# Patient Record
Sex: Female | Born: 1965 | Hispanic: No | Smoking: Never smoker
Health system: Southern US, Community
[De-identification: ages and names within clinical notes are randomized; demographics above are authoritative.]

## PROBLEM LIST (undated history)

## (undated) DIAGNOSIS — R51 Headache: Secondary | ICD-10-CM

## (undated) DIAGNOSIS — R0789 Other chest pain: Secondary | ICD-10-CM

## (undated) DIAGNOSIS — R002 Palpitations: Secondary | ICD-10-CM

## (undated) DIAGNOSIS — N8110 Cystocele, unspecified: Secondary | ICD-10-CM

## (undated) DIAGNOSIS — I10 Essential (primary) hypertension: Secondary | ICD-10-CM

## (undated) DIAGNOSIS — IMO0002 Reserved for concepts with insufficient information to code with codable children: Secondary | ICD-10-CM

## (undated) DIAGNOSIS — J069 Acute upper respiratory infection, unspecified: Secondary | ICD-10-CM

## (undated) DIAGNOSIS — F419 Anxiety disorder, unspecified: Secondary | ICD-10-CM

## (undated) DIAGNOSIS — S92919A Unspecified fracture of unspecified toe(s), initial encounter for closed fracture: Secondary | ICD-10-CM

## (undated) DIAGNOSIS — S139XXA Sprain of joints and ligaments of unspecified parts of neck, initial encounter: Secondary | ICD-10-CM

## (undated) DIAGNOSIS — D649 Anemia, unspecified: Secondary | ICD-10-CM

## (undated) DIAGNOSIS — R011 Cardiac murmur, unspecified: Secondary | ICD-10-CM

## (undated) DIAGNOSIS — I209 Angina pectoris, unspecified: Secondary | ICD-10-CM

## (undated) HISTORY — DX: Cardiac murmur, unspecified: R01.1

## (undated) HISTORY — DX: Other chest pain: R07.89

## (undated) HISTORY — DX: Anxiety disorder, unspecified: F41.9

## (undated) HISTORY — DX: Palpitations: R00.2

---
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
## 1998-05-11 ENCOUNTER — Emergency Department (HOSPITAL_COMMUNITY): Admission: EM | Admit: 1998-05-11 | Discharge: 1998-05-11 | Payer: Self-pay | Admitting: Emergency Medicine

## 1998-05-21 ENCOUNTER — Emergency Department (HOSPITAL_COMMUNITY): Admission: EM | Admit: 1998-05-21 | Discharge: 1998-05-21 | Payer: Self-pay | Admitting: Emergency Medicine

## 1998-12-19 ENCOUNTER — Encounter: Admission: RE | Admit: 1998-12-19 | Discharge: 1998-12-19 | Payer: Self-pay | Admitting: Internal Medicine

## 1999-02-04 ENCOUNTER — Emergency Department (HOSPITAL_COMMUNITY): Admission: EM | Admit: 1999-02-04 | Discharge: 1999-02-04 | Payer: Self-pay | Admitting: *Deleted

## 1999-02-04 ENCOUNTER — Encounter: Payer: Self-pay | Admitting: Emergency Medicine

## 1999-07-04 ENCOUNTER — Inpatient Hospital Stay (HOSPITAL_COMMUNITY): Admission: AD | Admit: 1999-07-04 | Discharge: 1999-07-04 | Payer: Self-pay | Admitting: Obstetrics & Gynecology

## 1999-07-07 ENCOUNTER — Encounter: Payer: Self-pay | Admitting: *Deleted

## 1999-07-07 ENCOUNTER — Emergency Department (HOSPITAL_COMMUNITY): Admission: EM | Admit: 1999-07-07 | Discharge: 1999-07-07 | Payer: Self-pay | Admitting: Emergency Medicine

## 1999-07-09 ENCOUNTER — Inpatient Hospital Stay (HOSPITAL_COMMUNITY): Admission: AD | Admit: 1999-07-09 | Discharge: 1999-07-09 | Payer: Self-pay | Admitting: *Deleted

## 1999-07-18 ENCOUNTER — Inpatient Hospital Stay (HOSPITAL_COMMUNITY): Admission: AD | Admit: 1999-07-18 | Discharge: 1999-07-18 | Payer: Self-pay | Admitting: Obstetrics & Gynecology

## 1999-12-05 ENCOUNTER — Inpatient Hospital Stay (HOSPITAL_COMMUNITY): Admission: AD | Admit: 1999-12-05 | Discharge: 1999-12-05 | Payer: Self-pay | Admitting: Obstetrics & Gynecology

## 1999-12-13 ENCOUNTER — Encounter: Admission: RE | Admit: 1999-12-13 | Discharge: 1999-12-13 | Payer: Self-pay | Admitting: Hematology and Oncology

## 2000-01-07 ENCOUNTER — Encounter: Admission: RE | Admit: 2000-01-07 | Discharge: 2000-01-07 | Payer: Self-pay | Admitting: Hematology and Oncology

## 2000-04-29 ENCOUNTER — Inpatient Hospital Stay (HOSPITAL_COMMUNITY): Admission: EM | Admit: 2000-04-29 | Discharge: 2000-04-29 | Payer: Self-pay | Admitting: Obstetrics

## 2000-05-11 ENCOUNTER — Inpatient Hospital Stay (HOSPITAL_COMMUNITY): Admission: AD | Admit: 2000-05-11 | Discharge: 2000-05-11 | Payer: Self-pay | Admitting: Obstetrics & Gynecology

## 2000-07-01 ENCOUNTER — Inpatient Hospital Stay (HOSPITAL_COMMUNITY): Admission: AD | Admit: 2000-07-01 | Discharge: 2000-07-01 | Payer: Self-pay | Admitting: Obstetrics

## 2000-10-28 ENCOUNTER — Encounter: Payer: Self-pay | Admitting: Emergency Medicine

## 2000-10-28 ENCOUNTER — Emergency Department (HOSPITAL_COMMUNITY): Admission: EM | Admit: 2000-10-28 | Discharge: 2000-10-28 | Payer: Self-pay | Admitting: Emergency Medicine

## 2000-11-09 ENCOUNTER — Encounter: Admission: RE | Admit: 2000-11-09 | Discharge: 2000-11-09 | Payer: Self-pay | Admitting: Internal Medicine

## 2000-11-10 ENCOUNTER — Inpatient Hospital Stay (HOSPITAL_COMMUNITY): Admission: AD | Admit: 2000-11-10 | Discharge: 2000-11-10 | Payer: Self-pay | Admitting: *Deleted

## 2000-11-26 ENCOUNTER — Encounter: Admission: RE | Admit: 2000-11-26 | Discharge: 2000-11-26 | Payer: Self-pay | Admitting: Internal Medicine

## 2001-02-17 ENCOUNTER — Inpatient Hospital Stay (HOSPITAL_COMMUNITY): Admission: AD | Admit: 2001-02-17 | Discharge: 2001-02-17 | Payer: Self-pay | Admitting: Obstetrics

## 2001-06-25 ENCOUNTER — Inpatient Hospital Stay (HOSPITAL_COMMUNITY): Admission: AD | Admit: 2001-06-25 | Discharge: 2001-06-25 | Payer: Self-pay | Admitting: Obstetrics & Gynecology

## 2002-02-01 ENCOUNTER — Inpatient Hospital Stay (HOSPITAL_COMMUNITY): Admission: AD | Admit: 2002-02-01 | Discharge: 2002-02-01 | Payer: Self-pay | Admitting: *Deleted

## 2002-03-10 ENCOUNTER — Encounter: Payer: Self-pay | Admitting: Internal Medicine

## 2002-03-10 ENCOUNTER — Emergency Department (HOSPITAL_COMMUNITY): Admission: EM | Admit: 2002-03-10 | Discharge: 2002-03-10 | Payer: Self-pay | Admitting: Emergency Medicine

## 2002-09-11 ENCOUNTER — Encounter: Payer: Self-pay | Admitting: Emergency Medicine

## 2002-09-11 ENCOUNTER — Emergency Department (HOSPITAL_COMMUNITY): Admission: EM | Admit: 2002-09-11 | Discharge: 2002-09-11 | Payer: Self-pay | Admitting: Emergency Medicine

## 2003-06-14 ENCOUNTER — Emergency Department (HOSPITAL_COMMUNITY): Admission: EM | Admit: 2003-06-14 | Discharge: 2003-06-14 | Payer: Self-pay | Admitting: Emergency Medicine

## 2004-06-19 ENCOUNTER — Emergency Department (HOSPITAL_COMMUNITY): Admission: EM | Admit: 2004-06-19 | Discharge: 2004-06-19 | Payer: Self-pay | Admitting: Emergency Medicine

## 2006-12-27 ENCOUNTER — Emergency Department (HOSPITAL_COMMUNITY): Admission: EM | Admit: 2006-12-27 | Discharge: 2006-12-27 | Payer: Self-pay | Admitting: *Deleted

## 2007-02-08 ENCOUNTER — Inpatient Hospital Stay (HOSPITAL_COMMUNITY): Admission: AD | Admit: 2007-02-08 | Discharge: 2007-02-08 | Payer: Self-pay | Admitting: Obstetrics & Gynecology

## 2007-02-11 ENCOUNTER — Emergency Department (HOSPITAL_COMMUNITY): Admission: EM | Admit: 2007-02-11 | Discharge: 2007-02-11 | Payer: Self-pay | Admitting: Emergency Medicine

## 2007-07-03 ENCOUNTER — Emergency Department (HOSPITAL_COMMUNITY): Admission: EM | Admit: 2007-07-03 | Discharge: 2007-07-03 | Payer: Self-pay | Admitting: Emergency Medicine

## 2007-11-01 ENCOUNTER — Emergency Department (HOSPITAL_COMMUNITY): Admission: EM | Admit: 2007-11-01 | Discharge: 2007-11-01 | Payer: Self-pay | Admitting: Emergency Medicine

## 2008-01-23 ENCOUNTER — Inpatient Hospital Stay (HOSPITAL_COMMUNITY): Admission: AD | Admit: 2008-01-23 | Discharge: 2008-01-23 | Payer: Self-pay | Admitting: Obstetrics & Gynecology

## 2008-02-03 ENCOUNTER — Emergency Department (HOSPITAL_COMMUNITY): Admission: EM | Admit: 2008-02-03 | Discharge: 2008-02-03 | Payer: Self-pay | Admitting: Emergency Medicine

## 2008-03-31 ENCOUNTER — Emergency Department (HOSPITAL_COMMUNITY): Admission: EM | Admit: 2008-03-31 | Discharge: 2008-03-31 | Payer: Self-pay | Admitting: Emergency Medicine

## 2008-05-17 ENCOUNTER — Inpatient Hospital Stay (HOSPITAL_COMMUNITY): Admission: AD | Admit: 2008-05-17 | Discharge: 2008-05-17 | Payer: Self-pay | Admitting: Obstetrics & Gynecology

## 2008-06-14 ENCOUNTER — Inpatient Hospital Stay (HOSPITAL_COMMUNITY): Admission: AD | Admit: 2008-06-14 | Discharge: 2008-06-14 | Payer: Self-pay | Admitting: Family Medicine

## 2008-09-22 DIAGNOSIS — J069 Acute upper respiratory infection, unspecified: Secondary | ICD-10-CM

## 2008-09-22 HISTORY — DX: Acute upper respiratory infection, unspecified: J06.9

## 2008-10-12 ENCOUNTER — Emergency Department (HOSPITAL_COMMUNITY): Admission: EM | Admit: 2008-10-12 | Discharge: 2008-10-12 | Payer: Self-pay | Admitting: Emergency Medicine

## 2009-02-25 ENCOUNTER — Inpatient Hospital Stay (HOSPITAL_COMMUNITY): Admission: AD | Admit: 2009-02-25 | Discharge: 2009-02-25 | Payer: Self-pay | Admitting: Obstetrics & Gynecology

## 2009-06-07 ENCOUNTER — Emergency Department (HOSPITAL_COMMUNITY): Admission: EM | Admit: 2009-06-07 | Discharge: 2009-06-07 | Payer: Self-pay | Admitting: Emergency Medicine

## 2009-11-01 ENCOUNTER — Inpatient Hospital Stay (HOSPITAL_COMMUNITY): Admission: AD | Admit: 2009-11-01 | Discharge: 2009-11-01 | Payer: Self-pay | Admitting: Obstetrics & Gynecology

## 2009-11-04 ENCOUNTER — Emergency Department (HOSPITAL_COMMUNITY): Admission: EM | Admit: 2009-11-04 | Discharge: 2009-11-04 | Payer: Self-pay | Admitting: Emergency Medicine

## 2009-12-20 ENCOUNTER — Ambulatory Visit: Payer: Self-pay | Admitting: Obstetrics and Gynecology

## 2009-12-28 ENCOUNTER — Ambulatory Visit: Payer: Self-pay | Admitting: Family Medicine

## 2009-12-28 DIAGNOSIS — R002 Palpitations: Secondary | ICD-10-CM

## 2009-12-28 DIAGNOSIS — L570 Actinic keratosis: Secondary | ICD-10-CM

## 2009-12-28 DIAGNOSIS — N949 Unspecified condition associated with female genital organs and menstrual cycle: Secondary | ICD-10-CM

## 2009-12-28 DIAGNOSIS — E669 Obesity, unspecified: Secondary | ICD-10-CM

## 2009-12-28 DIAGNOSIS — I10 Essential (primary) hypertension: Secondary | ICD-10-CM

## 2009-12-28 HISTORY — DX: Palpitations: R00.2

## 2010-01-04 ENCOUNTER — Encounter: Payer: Self-pay | Admitting: Family Medicine

## 2010-01-04 ENCOUNTER — Ambulatory Visit: Payer: Self-pay | Admitting: Family Medicine

## 2010-01-04 LAB — CONVERTED CEMR LAB
Cholesterol: 192 mg/dL (ref 0–200)
Triglycerides: 57 mg/dL (ref <150)
VLDL: 11 mg/dL (ref 0–40)

## 2010-01-07 ENCOUNTER — Encounter: Payer: Self-pay | Admitting: Family Medicine

## 2010-01-07 LAB — CONVERTED CEMR LAB
AST: 12 units/L (ref 0–37)
BUN: 18 mg/dL (ref 6–23)
Calcium: 9.6 mg/dL (ref 8.4–10.5)
Chloride: 103 meq/L (ref 96–112)
Creatinine, Ser: 0.95 mg/dL (ref 0.40–1.20)

## 2010-07-22 ENCOUNTER — Ambulatory Visit: Payer: Self-pay | Admitting: Nurse Practitioner

## 2010-07-22 ENCOUNTER — Inpatient Hospital Stay (HOSPITAL_COMMUNITY): Admission: AD | Admit: 2010-07-22 | Discharge: 2010-07-22 | Payer: Self-pay | Admitting: Obstetrics and Gynecology

## 2010-10-22 NOTE — Assessment & Plan Note (Signed)
Summary: NP/HTN/KF   Vital Signs:  Patient profile:   45 year old female Height:      66.5 inches Weight:      189.7 pounds BMI:     30.27 Temp:     97.8 degrees F oral Pulse rate:   79 / minute BP sitting:   128 / 89  (left arm) Cuff size:   regular  Vitals Entered By: Gladstone Pih (December 28, 2009 4:03 PM) CC: NP-HTN Is Patient Diabetic? No Pain Assessment Patient in pain? no        Primary Care Provider:  Antoine Primas  CC:  NP-HTN.  History of Present Illness: 45 yo female here to establich care.  Hx is + for HTN, pt states that it has been as high as 200's systolic but today is 128/89.  Pt denies any HA, visual changes, n/v, abdominal pain or cp.  Pt has been on clonidine for the last year and recently was placed on HCTZ by her gynacolgist.  Pt always had her medications prescribed by urgent care.  Hx of palpatations used to them happen from time to time but seems to always resolve within a minute.  Never accompanied by chest pain or LOC.    Obesity-  Pt has started a diet recently and lost 3 # .  Pt is joining the [facility] and already has an exercise plan in mind  Fm hx significant for CAD deaths.  Never has had her cholesterol checked.    Hx of DUB.  Pt is being followed by gyn clinic.    Habits & Providers  Alcohol-Tobacco-Diet     Tobacco Status: never  Current Medications (verified): 1)  Hydrochlorothiazide 25 Mg Tabs (Hydrochlorothiazide) .Marland Kitchen.. 1 Tab By Mouth Once Daily 2)  Catapres 0.1 Mg Tabs (Clonidine Hcl) .... Take 1 Tab By Mouth Two Times A Day  Allergies (verified): No Known Drug Allergies  Past History:  Past Surgical History: reattachment of left ring finger as a kid (36 yo)  Family History: Fm hx of heart dx, DM, PVD on both sides of family.    Social History: lives with roommate no smoke, drink, or illicits works as a Secondary school teacher Has some college education widowSmoking Status:  never  Review of Systems       deneis fever, chills, nausea,  vomiting, diarrhea or constipation   Physical Exam  General:  mild obese, NAD pleasant Eyes:  PERRLA, EOMI Ears:  TM intact Mouth:  MMM, mildly poor detition Lungs:  CTAB Heart:  RRR no murmur Abdomen:  BS+, NT, ND Extremities:  +2 pulses Neurologic:  2-12 intact Skin:  face- right cheek has a AK   Impression & Recommendations:  Problem # 1:  ESSENTIAL HYPERTENSION, BENIGN (ICD-401.1) controlled today.  Will get labs in near future and f/u in 1-2 months.  Continue pt current tx.   Her updated medication list for this problem includes:    Hydrochlorothiazide 25 Mg Tabs (Hydrochlorothiazide) .Marland Kitchen... 1 tab by mouth once daily    Catapres 0.1 Mg Tabs (Clonidine hcl) .Marland Kitchen... Take 1 tab by mouth two times a day  Orders: Comp Met[facility] ([id number])Future Orders: Lipid[facility] ([id number]) ... 01/10/2011 Comp [facility] ([id number]) ... 01/08/2011  Problem # 2:  PALPITATIONS (ICD-785.1) having none now, will address if become a problem  Problem # 3:  ACTINIC KERATOSIS (ICD-702.0) likely freeze at next visit  Complete Medication List: 1)  Hydrochlorothiazide 25 Mg Tabs (Hydrochlorothiazide) .Marland Kitchen.. 1 tab by mouth once daily 2)  Catapres  0.1 Mg Tabs (Clonidine hcl) .... Take 1 tab by mouth two times a day  Patient Instructions: 1)  It is great to meet you 2)  I am givng you your blood pressure medication.  With diet and exercise I hope we can get off medication in the year. 3)  I am drawing some blood today, and also I need you to come in while fasting for a cholesterol level.  When I get the results, no news is good news.   4)  I want to see you again in 2-3 months  Prescriptions: CATAPRES 0.1 MG TABS (CLONIDINE HCL) take 1 tab by mouth two times a day  #186 x 3   Entered and Authorized by:   Antoine Primas DO   Signed by:   Antoine Primas DO on 12/28/2009   Method used:   Electronically to        [facility] (retail)       [address]       [place], [place]   [id number]       Ph: [phone]       Fax: [phone]   RxID:   [id number] HYDROCHLOROTHIAZIDE 25 MG TABS (HYDROCHLOROTHIAZIDE) 1 tab by mouth once daily  #96 x 3   Entered and Authorized by:   Antoine Primas DO   Signed by:   Antoine Primas DO on 12/28/2009   Method used:   Electronically to        [facility] [address] (retail)       [address]       [place], [place]  [id number]       Ph: [phone]       Fax: [phone]   RxID:   [id number]

## 2010-10-26 ENCOUNTER — Encounter: Payer: Self-pay | Admitting: *Deleted

## 2010-12-04 LAB — URINE MICROSCOPIC-ADD ON

## 2010-12-04 LAB — CBC
HCT: 34.2 % — ABNORMAL LOW (ref 36.0–46.0)
MCH: 29.4 pg (ref 26.0–34.0)
MCHC: 33.4 g/dL (ref 30.0–36.0)
MCV: 87.9 fL (ref 78.0–100.0)
RDW: 14.4 % (ref 11.5–15.5)

## 2010-12-04 LAB — URINALYSIS, ROUTINE W REFLEX MICROSCOPIC
Bilirubin Urine: NEGATIVE
Nitrite: NEGATIVE
Specific Gravity, Urine: >1.03 — ABNORMAL HIGH (ref 1.005–1.030)
pH: 6 (ref 5.0–8.0)

## 2010-12-04 LAB — WET PREP, GENITAL: Clue Cells Wet Prep HPF POC: NONE SEEN

## 2010-12-11 LAB — POCT I-STAT, CHEM 8
BUN: 13 mg/dL (ref 6–23)
Chloride: 104 mEq/L (ref 96–112)
Creatinine, Ser: 1 mg/dL (ref 0.4–1.2)
Sodium: 137 mEq/L (ref 135–145)
TCO2: 29 mmol/L (ref 0–100)

## 2010-12-11 LAB — URINE MICROSCOPIC-ADD ON

## 2010-12-11 LAB — URINALYSIS, ROUTINE W REFLEX MICROSCOPIC
Nitrite: NEGATIVE
Protein, ur: 30 mg/dL — AB
Specific Gravity, Urine: 1.014 (ref 1.005–1.030)
Urobilinogen, UA: 0.2 mg/dL (ref 0.0–1.0)

## 2010-12-11 LAB — POCT PREGNANCY, URINE: Preg Test, Ur: NEGATIVE

## 2010-12-13 LAB — CBC
HCT: 35 % — ABNORMAL LOW (ref 36.0–46.0)
MCHC: 32.6 g/dL (ref 30.0–36.0)
MCV: 82.5 fL (ref 78.0–100.0)
RBC: 4.24 MIL/uL (ref 3.87–5.11)
WBC: 6.5 10*3/uL (ref 4.0–10.5)

## 2010-12-13 LAB — COMPREHENSIVE METABOLIC PANEL
AST: 19 U/L (ref 0–37)
BUN: 10 mg/dL (ref 6–23)
CO2: 28 mEq/L (ref 19–32)
Calcium: 9.7 mg/dL (ref 8.4–10.5)
Chloride: 104 mEq/L (ref 96–112)
Creatinine, Ser: 1.08 mg/dL (ref 0.4–1.2)
GFR calc Af Amer: >60 mL/min (ref >60)
GFR calc non Af Amer: 55 mL/min — ABNORMAL LOW (ref >60)
Glucose, Bld: 98 mg/dL (ref 70–99)
Total Bilirubin: 0.2 mg/dL — ABNORMAL LOW (ref 0.3–1.2)

## 2010-12-13 LAB — URINE MICROSCOPIC-ADD ON

## 2010-12-13 LAB — URINALYSIS, ROUTINE W REFLEX MICROSCOPIC
Bilirubin Urine: NEGATIVE
Leukocytes, UA: NEGATIVE
Nitrite: NEGATIVE
Specific Gravity, Urine: 1.025 (ref 1.005–1.030)
Urobilinogen, UA: 0.2 mg/dL (ref 0.0–1.0)
pH: 6 (ref 5.0–8.0)

## 2010-12-13 LAB — GC/CHLAMYDIA PROBE AMP, GENITAL
Chlamydia, DNA Probe: NEGATIVE
GC Probe Amp, Genital: NEGATIVE

## 2010-12-13 LAB — WET PREP, GENITAL: Trich, Wet Prep: NONE SEEN

## 2010-12-29 ENCOUNTER — Emergency Department (HOSPITAL_COMMUNITY): Payer: Self-pay

## 2010-12-29 ENCOUNTER — Observation Stay (HOSPITAL_COMMUNITY)
Admission: EM | Admit: 2010-12-29 | Discharge: 2010-12-31 | Disposition: A | Discharge status: Home / Self Care | Payer: Self-pay | Attending: Family Medicine | Admitting: Family Medicine

## 2010-12-29 DIAGNOSIS — I10 Essential (primary) hypertension: Secondary | ICD-10-CM

## 2010-12-29 DIAGNOSIS — R112 Nausea with vomiting, unspecified: Secondary | ICD-10-CM | POA: Insufficient documentation

## 2010-12-29 DIAGNOSIS — N838 Other noninflammatory disorders of ovary, fallopian tube and broad ligament: Secondary | ICD-10-CM | POA: Insufficient documentation

## 2010-12-29 DIAGNOSIS — R109 Unspecified abdominal pain: Secondary | ICD-10-CM

## 2010-12-29 DIAGNOSIS — M545 Low back pain, unspecified: Secondary | ICD-10-CM | POA: Insufficient documentation

## 2010-12-29 LAB — COMPREHENSIVE METABOLIC PANEL
ALT: 16 U/L (ref 0–35)
Alkaline Phosphatase: 71 U/L (ref 39–117)
BUN: 7 mg/dL (ref 6–23)
CO2: 18 mEq/L — ABNORMAL LOW (ref 19–32)
Chloride: 107 mEq/L (ref 96–112)
Glucose, Bld: 116 mg/dL — ABNORMAL HIGH (ref 70–99)
Potassium: 3.4 mEq/L — ABNORMAL LOW (ref 3.5–5.1)
Sodium: 135 mEq/L (ref 135–145)
Total Bilirubin: 0.5 mg/dL (ref 0.3–1.2)
Total Protein: 7.1 g/dL (ref 6.0–8.3)

## 2010-12-29 LAB — CBC
HCT: 36.7 % (ref 36.0–46.0)
Hemoglobin: 12.1 g/dL (ref 12.0–15.0)
MCV: 84.2 fL (ref 78.0–100.0)
RBC: 4.36 MIL/uL (ref 3.87–5.11)
WBC: 8.8 10*3/uL (ref 4.0–10.5)

## 2010-12-29 LAB — URINALYSIS, ROUTINE W REFLEX MICROSCOPIC
Glucose, UA: NEGATIVE mg/dL
Ketones, ur: NEGATIVE mg/dL
Nitrite: NEGATIVE
Specific Gravity, Urine: 1.019 (ref 1.005–1.030)
pH: 8 (ref 5.0–8.0)

## 2010-12-29 LAB — DIFFERENTIAL
Basophils Absolute: 0 10*3/uL (ref 0.0–0.1)
Eosinophils Relative: 1 % (ref 0–5)
Lymphocytes Relative: 13 % (ref 12–46)
Lymphs Abs: 1.1 10*3/uL (ref 0.7–4.0)
Neutro Abs: 7.2 10*3/uL (ref 1.7–7.7)
Neutrophils Relative %: 82 % — ABNORMAL HIGH (ref 43–77)

## 2010-12-30 LAB — WET PREP, GENITAL: Yeast Wet Prep HPF POC: NONE SEEN

## 2010-12-30 LAB — URINALYSIS, ROUTINE W REFLEX MICROSCOPIC
Glucose, UA: NEGATIVE mg/dL
Ketones, ur: NEGATIVE mg/dL
Protein, ur: NEGATIVE mg/dL
Urobilinogen, UA: 0.2 mg/dL (ref 0.0–1.0)

## 2010-12-30 LAB — POCT PREGNANCY, URINE: Preg Test, Ur: NEGATIVE

## 2010-12-30 LAB — GC/CHLAMYDIA PROBE AMP, GENITAL: GC Probe Amp, Genital: NEGATIVE

## 2010-12-31 LAB — BASIC METABOLIC PANEL
Calcium: 9.4 mg/dL (ref 8.4–10.5)
GFR calc Af Amer: >60 mL/min (ref >60)
GFR calc non Af Amer: >60 mL/min (ref >60)
Potassium: 3.6 mEq/L (ref 3.5–5.1)
Sodium: 138 mEq/L (ref 135–145)

## 2010-12-31 LAB — CA 125: CA 125: 4.3 U/mL (ref 0.0–30.2)

## 2010-12-31 LAB — CBC
Hemoglobin: 11 g/dL — ABNORMAL LOW (ref 12.0–15.0)
MCV: 84.7 fL (ref 78.0–100.0)
Platelets: 275 10*3/uL (ref 150–400)
RBC: 3.99 MIL/uL (ref 3.87–5.11)
WBC: 7.6 10*3/uL (ref 4.0–10.5)

## 2010-12-31 LAB — GC/CHLAMYDIA PROBE AMP, URINE: GC Probe Amp, Urine: NEGATIVE

## 2011-01-03 ENCOUNTER — Ambulatory Visit: Payer: Self-pay | Admitting: Obstetrics & Gynecology

## 2011-01-04 NOTE — Discharge Summary (Signed)
NAMEJASLYNNE, [name]         ACCOUNT NO.:  [id number]  MEDICAL RECORD NO.:  [id number]           PATIENT TYPE:  E  LOCATION:  [facility]                         FACILITY:  [facility]  PHYSICIAN:  Ayda Tancredi A. Sheffield Mckinney, M.D.    DATE OF BIRTH:  September 24, 1965  DATE OF ADMISSION:  12/29/2010 DATE OF DISCHARGE:  12/31/2010                              DISCHARGE SUMMARY   PRIMARY CARE PROVIDER:  Antoine Primas, DO, at [facility]  DISCHARGE DIAGNOSES: 1. Right flank pain and low back pain, likely secondary to ovarian     mass. 2. Nausea, vomiting secondary to pain. 3. Hypertension.  DISCHARGE MEDICATIONS: 1. Tylenol 325 mg two tablets by mouth every 8 hours as needed for     pain. 2. Colace 100 mg one tablet by mouth twice daily as needed for     constipation. 3. Zofran 4 mg tablets, one tablet by mouth every 6 hours as needed     for nausea and vomiting. 4. Percocet 5/325 one tablet by mouth every 6 hours as needed for     severe pain. 5. Phenergan rectal suppository 12.5 mg one tablet rectally every 8     hours as needed for nausea and vomiting and unable to tolerate oral     medications, please place tablet per rectum. 6. Phenergan 12.5 one tablet by mouth every 8 hours as needed for     nausea or vomiting, hold for drowsiness. 7. Clonidine 0.1 mg one tablet by mouth twice daily. 8. Hydrochlorothiazide 25 mg one tablet by mouth daily. 9. Ibuprofen 200 mg 4 tablets by mouth every 6 hours as needed for     headaches.  CONSULTS:  None at this time.  PROCEDURES:  On December 29, 2010, CT abdomen and pelvis without contrast: 1. No acute abnormality. 2. Bilateral ovarian cystic lesions, likely cyst.  Right cystic lesion     measures 46 mm.  Six weeks followup transvaginal ultrasound     recommended to assess for resolution.  PERTINENT LABORATORY DATA:  At discharge, BMET on December 31, 2010, sodium 138, potassium 3.6, chloride 106, CO2 is 25, BUN 10, creatinine 0.77, glucose 105.  On  December 30, 2010, a GC/Chlamydia on urine were negative. CBC:  White count 7.6, hemoglobin 11, hematocrit 33.8, platelets 275. Urine pregnancy negative.  BRIEF HOSPITAL COURSE:  This is a 45 year old female who has a history of ovarian cyst since 2010.  She presented with right flank pain, low back pain, nausea, vomiting, and was unable to tolerate p.o. 1. Right flank and low back pain.  The patient initially was started     on morphine IV p.r.n. in the emergency department.  When the     patient was transferred to the floor, we discontinued the morphine     and started patient on Toradol 30 mg IV every 6 hours as needed     for pain.  The patient was receiving the Toradol every 6 hours and     did claim that in between doses, she still endorsed pain in her     lower back.  On hospital day 2, the Toradol IV was discontinued  and     the patient was started on Tylenol 650 every 8 hours as needed.  We     will discharge the patient home with the Tylenol 650 mg every hours     as needed and give a prescription for Percocet 5/325 one tablet     every 6 hours as needed for severe pain.  The patient will receive     enough Percocet for 8 days until she follows up with her primary     care provider, Dr. Antoine Primas, at [facility] in     1 week. 2. Nausea, vomiting.  This resolved on hospital day #2.  The patient     was tolerating a p.o. diet.  We will discharge the patient home     with both Phenergan by mouth and per rectum and Zofran as needed for     nausea and vomiting. 3. Hypertension.  The patient's blood pressures remained stable     throughout the hospital course.  The patient was continued on her     home regimen of clonidine 0.1 mg b.i.d. and hydrochlorothiazide 25     mg daily. 4. History of ovarian cyst.  On a previous admission on February 25, 2009,     the patient had a transvaginal ultrasound:  A 2-cm posterior     intramural fibroid identified, 6 mm echogenic focus  within the     endometrial tissue that may be an area of mineralization or a tiny     endometrial polyp, 4.3-cm simple appearing cyst in the right     adnexal space, and 2.3-cm cyst identified within the left ovary.     On December 29, 2010, CT abdomen and pelvis, results were as above.  We     will refer the patient to [facility] to help manage the     patient's ovarian masses.  It seems that the patient presents to     the emergency department frequently for nausea, vomiting, low back     pain, and right flank pain.  DISCHARGE INSTRUCTIONS:  Activity:  Ad lib. Diet:  Low sodium, heart healthy.  FOLLOWUP APPOINTMENTS:   1. Return or please see Dr. Debroah Loop on Friday, January 03, 2011, at 8:45 a.m. 2. Please return to Dr. Katrinka Blazing on Monday, January 07, 2011, at 3:15 p.m.  SPECIAL INSTRUCTIONS:  Please see a gynecologist at the [facility] for management of your ovarian masses.  Please take the Percocet one tablet every 6 hours as needed for severe pain and please hold for any drowsiness and please take the Zofran and Phenergan by mouth as needed for nausea, vomiting.  If you cannot tolerate swallowing pills, please use the Phenergan per rectum.  DISCHARGE CONDITION:  The patient was discharged home in stable medical condition.    ______________________________ Barnabas Lister, MD   ______________________________ [name], M.D.    ID/MEDQ  D:  12/31/2010  T:  01/01/2011  Job:  [id number]  cc:   Scheryl Darter, MD  Electronically Signed by Barnabas Lister MD on 01/02/2011 02:35:57 PM Electronically Signed by Zachery Dauer M.D. on 01/04/2011 07:19:22 PM

## 2011-01-07 ENCOUNTER — Encounter: Payer: Self-pay | Admitting: Family Medicine

## 2011-01-07 ENCOUNTER — Ambulatory Visit (INDEPENDENT_AMBULATORY_CARE_PROVIDER_SITE_OTHER): Payer: Self-pay | Admitting: Family Medicine

## 2011-01-07 DIAGNOSIS — N83209 Unspecified ovarian cyst, unspecified side: Secondary | ICD-10-CM

## 2011-01-07 DIAGNOSIS — M999 Biomechanical lesion, unspecified: Secondary | ICD-10-CM

## 2011-01-07 HISTORY — DX: Unspecified ovarian cyst, unspecified side: N83.209

## 2011-01-07 NOTE — Progress Notes (Signed)
  Subjective:    Patient ID: [name], female    DOB: 1966-08-25, 45 y.o.   MRN: [id number]  HPI Pt recently admitted with flank pain, found to have ovarian cyst bilateral scheduled on May 2nd to get a repeat ultrasound to make sure no change in size. Pt states the pain is much better. Pain does come and go but is much more improved. Pt states the percocet upset her stomach so not taking it, pt has been taking motrin which helps.  Pt states it still has pain from time to time usually with certain movements, that does not seem to be the same pain as before.  Pt bowel and bladder are normal, no radiating pain, no pain with foods. Pt states still mostly flank pain.    Review of Systems Denies fever, chills, nausea vomiting abdominal pain, dysuria, chest pain, shortness of breath dyspnea on exertion or numbness in extremities     Objective:   Physical Exam Gen: NAD,  HEENT: EOMI, PERRLA CV: RRR no murmur Pul: CTAB Abd: BS +, NT, ND, no CVA tenderness Ext no edema.  OMT Findings: Cervical:none Thoracic none Lumbar: L3 rotated and side bent right Sacrum: left on left     Assessment & Plan:

## 2011-01-07 NOTE — Assessment & Plan Note (Signed)
After verbal consent improved with HVLA on side.

## 2011-01-07 NOTE — Assessment & Plan Note (Signed)
Pt has follow up with gyn and ultrasound, will give motrin, for prn pain, will see if normal simple cysts then consider OCP

## 2011-01-13 ENCOUNTER — Other Ambulatory Visit: Payer: Self-pay | Admitting: Family Medicine

## 2011-01-13 NOTE — H&P (Signed)
[name], [name] NO.:  [id number]  MEDICAL RECORD NO.:  [id number]           PATIENT TYPE:  E  LOCATION:  [facility]                         FACILITY:  [facility]  PHYSICIAN:  [name], M.D.DATE OF BIRTH:  23-Aug-1966  DATE OF ADMISSION:  12/29/2010 DATE OF DISCHARGE:                             HISTORY & PHYSICAL   PCP:  Cassandra Dare, MD at [facility].  CHIEF COMPLAINT:  Right flank pain, nausea, and vomiting.  HISTORY OF PRESENT ILLNESS:  [name] is a 45 year old female presenting with right flank pain with an acute onset started yesterday and described as constant, sharp, and stabbing.  She does have some mild nausea and vomiting associated with the pain when it is more severe.  A thorough workup in the ER revealed bilateral ovarian cyst, but no other suspicious etiology for her pain.  The pain is moderately controlled with IV morphine at this time, but the patient is unable to keep anything down and we were asked to admit for overnight observation and fluids.  The patient denies headache, dizziness, vision changes, chest pain, shortness of breath, suprapubic pain, dysuria, abnormal vaginal bleeding or discharge, lower extremity edema, rash, history of abdominal surgeries, melena, and bright red blood per rectum.  She does endorse having her menses on December 24, 2010.  PAST MEDICAL HISTORY:  Hypertension, obesity, history of kidney stones, and history of vaginal bleeding.  ALLERGIES:  SEPTRA AND LATEX.  MEDICATIONS: 1. Clonidine 0.1 mg p.o. b.i.d. 2. Hydrochlorothiazide 25 mg p.o. daily.  PAST SURGICAL HISTORY:  Left ring finger reattachment at age 75.  SOCIAL HISTORY:  The patient lives in [place].  She denies tobacco, alcohol, or drug use.  FAMILY HISTORY:  Diabetes mellitus type 2.  REVIEW OF SYSTEMS:  See HPI.  PHYSICAL EXAM:  VITAL SIGNS:  Temperature 98, pulse 68, respiratory rate 20, blood pressure 162/109,  pulse oximetry 98% on room air. GENERAL:  She is alert and oriented x3, in mild distress on her right side. HEENT:  Normocephalic, atraumatic.  Pupils are equal, round, and reactive to light.  Extraocular muscles are intact.  Mucous membranes are moist. NECK:  Supple with no JVD, no thyromegaly. HEART:  Regular rate and rhythm.  No murmurs, rubs, or gallops. LUNGS:  Clear to auscultation bilaterally. ABDOMEN:  Soft with positive bowel sounds, nondistended with no masses, no guarding.  She does have mild tenderness to palpation on her right upper and lower quadrant. BACK:  No tenderness to palpation along paraspinal muscles or spinous process and she has no CVA tenderness. EXTREMITIES:  No cyanosis, clubbing, or edema. NEURO:  Cranial nerves II through XII are intact.  Strength is symmetric.  LABORATORY DATA:  White count 8.8, hemoglobin 12.1, hematocrit 36.7, platelets 308, neutrophils 82%.  Urine is negative.  Sodium 135, potassium 3.4 and this was replaced in the ER, chloride 107, CO2 of 18, BUN 7, creatinine 0.65, glucose 116.  LFTs are within normal limits.  CT of abdomen and pelvis showed bilateral ovarian cyst.  Urine pregnancy was negative.  ASSESSMENT/PLAN:  The patient is a 45 year old female with, 1. Right flank pain, no red flags,  possible etiology is due to ovarian     cyst.  We will admit overnight for observation, pain control,     nausea control, and IV fluids and likely send the patient to home     tomorrow if her pain is controlled and she is able to keep down     foods. 2. Hypertension.  We will restart her home medications. 3. Fluids, electrolytes, nutrition/gastrointestinal.  Again,     maintenance IV fluids and push p.o. fluids. 4. Prophylaxis heparin t.i.d. for DVT prophylaxis and disposition.  DISPOSITION:  Likely home in the morning.     Helane Rima, MD   ______________________________ [name], M.D.    EW/MEDQ  D:  12/29/2010  T:   12/29/2010  Job:  [id number]  Electronically Signed by Helane Rima MD on 01/06/2011 10:04:03 AM Electronically Signed by Acquanetta Belling M.D. on 01/13/2011 07:38:03 AM

## 2011-01-14 NOTE — Telephone Encounter (Signed)
Refill request

## 2011-01-16 ENCOUNTER — Telehealth: Payer: Self-pay | Admitting: Family Medicine

## 2011-01-16 MED ORDER — IBUPROFEN 800 MG PO TABS: 800.0000 mg | ORAL_TABLET | Freq: Three times a day (TID) | ORAL | Status: AC | PRN

## 2011-01-16 NOTE — Telephone Encounter (Signed)
Pt calling to ask for refill on Clonidine and rx for  Ibuprofen 800mg .  Was suppose to receive these when she came in for her appt on 4/17.  Show that refill was entered 4/23 for Clonidine, but pharmacy never received.  Please let her know when rxs have been sent.

## 2011-01-16 NOTE — Telephone Encounter (Signed)
Called pharmacy and clonidine is there . Advised patient that MD states in note, Motrin PRN pain. She wants RX for Ibuprofen 800 mg. Advised will send message to MD. Advised that he is out of office this week but should be back first of next  week. [facility] [address].

## 2011-01-16 NOTE — Telephone Encounter (Signed)
Refilled and done

## 2011-01-22 ENCOUNTER — Ambulatory Visit (INDEPENDENT_AMBULATORY_CARE_PROVIDER_SITE_OTHER): Payer: Self-pay | Admitting: Family Medicine

## 2011-01-22 ENCOUNTER — Other Ambulatory Visit: Payer: Self-pay | Admitting: Family Medicine

## 2011-01-22 ENCOUNTER — Ambulatory Visit: Payer: Self-pay | Admitting: Family Medicine

## 2011-01-22 DIAGNOSIS — N83209 Unspecified ovarian cyst, unspecified side: Secondary | ICD-10-CM

## 2011-01-22 LAB — POCT PREGNANCY, URINE: Preg Test, Ur: NEGATIVE

## 2011-01-23 NOTE — Group Therapy Note (Signed)
[name], [name]         ACCOUNT NO.:  [id number]  MEDICAL RECORD NO.:  [id number]           PATIENT TYPE:  LOCATION:  [facility]                     FACILITY:  PHYSICIAN:  Lucina Mellow, DO   DATE OF BIRTH:  [birth year]  DATE OF SERVICE:  01/22/2011                                 CLINIC NOTE  HISTORY OF PRESENT ILLNESS:  The patient presents to the clinic today for followup of her ovarian cyst.  She is a 45 year old female who is gravida 3, para 3-0-0-3 who presents after being hospitalized overnight for workup of right lower quadrant pain which is found to be an ovarian cyst.  She was referred by the family practice clinic at [facility] to be evaluated to see if oral contraceptive pills will be a good option for her to control her ovulation and causing her cyst that are now symptomatic.  The patient states that she is currently not sexually active.  She has done childbearing.  Currently not practicing any birth control methods and is open to birth control tablets at this time.  She has a history of elevated blood pressure and takes clonidine and hydrochlorothiazide and also takes Motrin 800 mg every hours as needed for the pain associated with her ovarian cyst.  She reports that she feels that she is about to start her period but she has not typically started with second day of the month.  PAST MEDICAL HISTORY:  High blood pressure and some history of kidney stones.  SURGICAL HISTORY:  Reattached of her left ring finger when she was 53 years' old.  She does not smoke, denies use of drugs or alcohol.  PHYSICAL EXAMINATION:  VITAL SIGNS:  On exam the patient's vital signs are temperature of 98, pulse is 81, blood pressure 134/91, weight of 207.3 pounds, height of 68 inches. GENERAL:  She is a pleasant appearing Caucasian female who looks her stated age. HEART:  Regular rate and rhythm with no audible murmurs. LUNGS:  Clear to auscultation. PELVIC:  External  genitalia normal.  External genitalia with appropriate amount of hair distribution.  With speculum exam it was noted that her vagina is moist.  This was appropriate amount of rugae and it is pink and moist.  Her cervix is easily visualized.  There are no abnormalities seen.  No discharge or bleeding noted in the vaginal vault or from the cervix.  Bimanual examination reveals tenderness to palpation with fullness on the patient's right.  Uterus is normal in size and shape and the left ovary is barely palpable is nontender.  ASSESSMENT: 1. Ovarian cyst ultrasound for December 29, 2010, shows that there are     bilateral ovarian cysts with the right cystic lesion measuring 4.6     cm and recommendation of a 6-week followup.  The patient is in     agreement of trying Depo Provera today after she did get a negative     urine pregnancy test in the clinic because we feel that to give her     oral contraceptive pills with her history of high blood pressure on     two occasions with only exacerbate that problem.  The patient  agrees to this and received Depo shot today.  When she leaves the     clinic today, she will make an ultrasound appointment for followup     of her ovarian cyst and she will be seen in clinic after the     ultrasound to discuss those results as well as the fact of the Depo-     Provera on her period and on her ovarian cyst symptomatology. 2. High blood pressure.  She is to continue with her clonidine     hydrochlorothiazide has preserve her primary care     provider and to follow up with them routinely as necessary for her     blood pressure.  The patient voices understanding of all teaching     and plans.          ______________________________ Lucina Mellow, DO    SH/MEDQ  D:  01/22/2011  T:  01/23/2011  Job:  [id number]

## 2011-02-11 ENCOUNTER — Ambulatory Visit (HOSPITAL_COMMUNITY): Payer: Self-pay

## 2011-02-11 ENCOUNTER — Ambulatory Visit (HOSPITAL_COMMUNITY): Admission: RE | Admit: 2011-02-11 | Payer: Self-pay | Source: Ambulatory Visit

## 2011-03-03 ENCOUNTER — Telehealth: Payer: Self-pay | Admitting: Family Medicine

## 2011-03-03 ENCOUNTER — Other Ambulatory Visit: Payer: Self-pay | Admitting: Family Medicine

## 2011-03-03 NOTE — Telephone Encounter (Signed)
Has taken the last pill of HCTZ and there are no refills needs to be faxed to [facility] in [place] [phone].

## 2011-03-03 NOTE — Telephone Encounter (Signed)
Refill request

## 2011-03-05 ENCOUNTER — Telehealth: Payer: Self-pay | Admitting: Family Medicine

## 2011-03-05 NOTE — Telephone Encounter (Signed)
Received 2 indications for this medication.  [facility] location.  If needs to be refilled elsewhere, can do this as well.  This is Dr. Michaelle Copas patient.

## 2011-03-05 NOTE — Telephone Encounter (Signed)
[facility] in [place], needs prescriptions faxed to the [facility]. Needs [facility] to [facility] sent [facility].  Please call when this is completed.

## 2011-03-07 NOTE — Telephone Encounter (Signed)
[facility] and they do have RX. Was sent in on 03/05/2011 Patient notified

## 2011-03-07 NOTE — Telephone Encounter (Signed)
Ms. Wike called to say she doesn't use [facility].  Need her rx for hctz sent to [facility] on [address]  Have been waiting since Monday.

## 2011-03-23 DIAGNOSIS — S139XXA Sprain of joints and ligaments of unspecified parts of neck, initial encounter: Secondary | ICD-10-CM

## 2011-03-23 DIAGNOSIS — IMO0002 Reserved for concepts with insufficient information to code with codable children: Secondary | ICD-10-CM

## 2011-03-23 HISTORY — DX: Reserved for concepts with insufficient information to code with codable children: IMO0002

## 2011-03-23 HISTORY — DX: Sprain of joints and ligaments of unspecified parts of neck, initial encounter: S13.9XXA

## 2011-04-12 ENCOUNTER — Encounter (HOSPITAL_COMMUNITY): Payer: Self-pay | Admitting: *Deleted

## 2011-04-12 ENCOUNTER — Inpatient Hospital Stay (HOSPITAL_COMMUNITY)
Admission: AD | Admit: 2011-04-12 | Discharge: 2011-04-12 | Discharge status: Against Medical Advice | Payer: Self-pay | Source: Ambulatory Visit | Attending: Obstetrics & Gynecology | Admitting: Obstetrics & Gynecology

## 2011-04-12 DIAGNOSIS — S3994XA Unspecified injury of external genitals, initial encounter: Secondary | ICD-10-CM

## 2011-04-12 DIAGNOSIS — S39848A Other specified injuries of external genitals, initial encounter: Secondary | ICD-10-CM | POA: Insufficient documentation

## 2011-04-12 DIAGNOSIS — IMO0002 Reserved for concepts with insufficient information to code with codable children: Secondary | ICD-10-CM

## 2011-04-12 DIAGNOSIS — R300 Dysuria: Secondary | ICD-10-CM

## 2011-04-12 DIAGNOSIS — N898 Other specified noninflammatory disorders of vagina: Secondary | ICD-10-CM

## 2011-04-12 DIAGNOSIS — S3993XA Unspecified injury of pelvis, initial encounter: Secondary | ICD-10-CM

## 2011-04-12 DIAGNOSIS — N949 Unspecified condition associated with female genital organs and menstrual cycle: Secondary | ICD-10-CM

## 2011-04-12 HISTORY — DX: Essential (primary) hypertension: I10

## 2011-04-12 HISTORY — DX: Headache: R51

## 2011-04-12 HISTORY — DX: Palpitations: R00.2

## 2011-04-12 LAB — URINE MICROSCOPIC-ADD ON

## 2011-04-12 LAB — WET PREP, GENITAL

## 2011-04-12 LAB — POCT PREGNANCY, URINE: Preg Test, Ur: NEGATIVE

## 2011-04-12 LAB — URINALYSIS, ROUTINE W REFLEX MICROSCOPIC
Ketones, ur: 15 mg/dL — AB
Nitrite: NEGATIVE
Protein, ur: NEGATIVE mg/dL
Urobilinogen, UA: 0.2 mg/dL (ref 0.0–1.0)

## 2011-04-12 MED ORDER — AZITHROMYCIN 250 MG PO TABS
1000.0000 mg | ORAL_TABLET | Freq: Once | ORAL | Status: AC
  Administered 2011-04-12: 1000 mg via ORAL
  Filled 2011-04-12: qty 4

## 2011-04-12 MED ORDER — METRONIDAZOLE 500 MG PO TABS
2000.0000 mg | ORAL_TABLET | Freq: Once | ORAL | Status: AC
  Administered 2011-04-12: 2000 mg via ORAL
  Filled 2011-04-12: qty 4

## 2011-04-12 MED ORDER — CEFIXIME 400 MG PO TABS
400.0000 mg | ORAL_TABLET | Freq: Once | ORAL | Status: AC
  Administered 2011-04-12: 400 mg via ORAL
  Filled 2011-04-12: qty 1

## 2011-04-12 MED ORDER — ONDANSETRON HCL 4 MG PO TABS
4.0000 mg | ORAL_TABLET | Freq: Once | ORAL | Status: AC
  Administered 2011-04-12: 4 mg via ORAL
  Filled 2011-04-12 (×2): qty 1

## 2011-04-12 MED ORDER — CYCLOBENZAPRINE HCL 10 MG PO TABS: 10.0000 mg | ORAL_TABLET | Freq: Three times a day (TID) | ORAL | Status: AC | PRN

## 2011-04-12 MED ORDER — LIDOCAINE HCL 2 % EX GEL
Freq: Once | CUTANEOUS | Status: AC
  Administered 2011-04-12: 20 via URETHRAL
  Filled 2011-04-12: qty 20

## 2011-04-12 MED ORDER — PROMETHAZINE HCL 25 MG PO TABS: 25.0000 mg | ORAL_TABLET | Freq: Four times a day (QID) | ORAL | Status: AC | PRN

## 2011-04-12 NOTE — ED Provider Notes (Signed)
HTN followed by PCP. Pt states she took BP meds today. Encouraged to call ASAP to discuss persistent high BPs. Reviewed BPs with Dr. Marice Potter, attending. No additional meds reccommended at this time.   Filed Vitals:   04/12/11 2047 04/12/11 2325  BP: 137/104 146/107  Pulse: 78 82  Temp: 98.6 F (37 C) 98.5 F (36.9 C)  TempSrc: Oral Oral  Resp: 20 20  Height: 5' 6.75" (1.695 m)   Weight: 92.08 kg (203 lb)
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
## 2011-04-16 ENCOUNTER — Ambulatory Visit (INDEPENDENT_AMBULATORY_CARE_PROVIDER_SITE_OTHER): Payer: Self-pay | Admitting: Family

## 2011-04-16 ENCOUNTER — Encounter: Payer: Self-pay | Admitting: Family

## 2011-04-16 DIAGNOSIS — Z113 Encounter for screening for infections with a predominantly sexual mode of transmission: Secondary | ICD-10-CM

## 2011-04-16 DIAGNOSIS — M542 Cervicalgia: Secondary | ICD-10-CM

## 2011-04-16 DIAGNOSIS — Z3049 Encounter for surveillance of other contraceptives: Secondary | ICD-10-CM

## 2011-04-16 LAB — HERPES SIMPLEX VIRUS CULTURE

## 2011-04-16 MED ORDER — MEDROXYPROGESTERONE ACETATE 150 MG/ML IM SUSP
150.0000 mg | Freq: Once | INTRAMUSCULAR | Status: AC
  Administered 2011-04-16: 150 mg via INTRAMUSCULAR

## 2011-04-16 MED ORDER — ACETAMINOPHEN-CODEINE 300-30 MG PO TABS: 1.0000 | ORAL_TABLET | ORAL | Status: DC | PRN

## 2011-04-16 MED ORDER — MEDROXYPROGESTERONE ACETATE 150 MG/ML IM SUSP: 150.0000 mg | INTRAMUSCULAR | Status: DC
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
## 2011-04-16 NOTE — Progress Notes (Signed)
Addended by: Lynnell Dike on: 04/16/2011 02:19 PM   Modules accepted: Orders

## 2011-04-17 LAB — GONOCOCCUS DNA, PCR: GC Probe Amp, Genital: NEGATIVE

## 2011-06-06 ENCOUNTER — Telehealth: Payer: Self-pay | Admitting: *Deleted
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
## 2011-06-13 LAB — I-STAT 8, (EC8 V) (CONVERTED LAB)
BUN: 11
Bicarbonate: 22.8
HCT: 36
Hemoglobin: 12.2
Operator id: 151321
Sodium: 136
TCO2: 24

## 2011-06-13 LAB — CBC
MCV: 83.4
Platelets: 301
WBC: 8.7

## 2011-06-13 LAB — POCT CARDIAC MARKERS
CKMB, poc: <1 — ABNORMAL LOW
Myoglobin, poc: 46.6
Operator id: 151321
Troponin i, poc: <0.05
Troponin i, poc: <0.05

## 2011-06-13 LAB — DIFFERENTIAL
Basophils Relative: 1
Eosinophils Absolute: 0.1
Lymphs Abs: 1.8
Neutrophils Relative %: 72

## 2011-06-23 LAB — URINALYSIS, ROUTINE W REFLEX MICROSCOPIC
Bilirubin Urine: NEGATIVE
Ketones, ur: NEGATIVE
Nitrite: NEGATIVE
Urobilinogen, UA: 0.2

## 2011-06-23 LAB — WET PREP, GENITAL

## 2011-07-02 ENCOUNTER — Ambulatory Visit: Payer: Self-pay

## 2012-01-01 ENCOUNTER — Other Ambulatory Visit: Payer: Self-pay | Admitting: Family Medicine

## 2012-01-01 MED ORDER — HYDROCHLOROTHIAZIDE 25 MG PO TABS: 25.0000 mg | ORAL_TABLET | Freq: Every day | ORAL | Status: DC

## 2012-01-01 NOTE — Telephone Encounter (Signed)
done

## 2012-01-01 NOTE — Telephone Encounter (Signed)
Patient is calling about a request from [facility] for HCTZ that was sent on 4/5 but was not replied on.  She is out of this medication.

## 2012-01-02 ENCOUNTER — Other Ambulatory Visit: Payer: Self-pay | Admitting: Advanced Practice Midwife

## 2012-01-02 ENCOUNTER — Ambulatory Visit (INDEPENDENT_AMBULATORY_CARE_PROVIDER_SITE_OTHER): Payer: Self-pay | Admitting: Advanced Practice Midwife

## 2012-01-02 VITALS — BP 161/103 | HR 82 | Temp 97.5°F | Ht 68.0 in | Wt 219.3 lb

## 2012-01-02 DIAGNOSIS — N760 Acute vaginitis: Secondary | ICD-10-CM

## 2012-01-02 DIAGNOSIS — N926 Irregular menstruation, unspecified: Secondary | ICD-10-CM

## 2012-01-02 DIAGNOSIS — Z3049 Encounter for surveillance of other contraceptives: Secondary | ICD-10-CM

## 2012-01-02 DIAGNOSIS — T7421XA Adult sexual abuse, confirmed, initial encounter: Secondary | ICD-10-CM

## 2012-01-02 DIAGNOSIS — R1031 Right lower quadrant pain: Secondary | ICD-10-CM

## 2012-01-02 DIAGNOSIS — A499 Bacterial infection, unspecified: Secondary | ICD-10-CM

## 2012-01-02 DIAGNOSIS — B9689 Other specified bacterial agents as the cause of diseases classified elsewhere: Secondary | ICD-10-CM

## 2012-01-02 DIAGNOSIS — IMO0002 Reserved for concepts with insufficient information to code with codable children: Secondary | ICD-10-CM

## 2012-01-02 DIAGNOSIS — N949 Unspecified condition associated with female genital organs and menstrual cycle: Secondary | ICD-10-CM

## 2012-01-02 LAB — WET PREP, GENITAL
Trich, Wet Prep: NONE SEEN
Yeast Wet Prep HPF POC: NONE SEEN

## 2012-01-02 MED ORDER — MEDROXYPROGESTERONE ACETATE 150 MG/ML IM SUSP
150.0000 mg | INTRAMUSCULAR | Status: AC
  Administered 2012-01-02: 150 mg via INTRAMUSCULAR

## 2012-01-02 MED ORDER — METRONIDAZOLE 250 MG PO TABS: 2000.0000 mg | ORAL_TABLET | Freq: Once | ORAL | Status: AC
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
## 2012-01-02 NOTE — Patient Instructions (Signed)

## 2012-01-04 ENCOUNTER — Encounter: Payer: Self-pay | Admitting: Advanced Practice Midwife

## 2012-01-04 DIAGNOSIS — IMO0002 Reserved for concepts with insufficient information to code with codable children: Secondary | ICD-10-CM | POA: Insufficient documentation

## 2012-01-04 DIAGNOSIS — B9689 Other specified bacterial agents as the cause of diseases classified elsewhere: Secondary | ICD-10-CM | POA: Insufficient documentation

## 2012-01-07 ENCOUNTER — Ambulatory Visit (HOSPITAL_COMMUNITY): Admission: RE | Admit: 2012-01-07 | Payer: Self-pay | Source: Ambulatory Visit

## 2012-01-26 ENCOUNTER — Other Ambulatory Visit: Payer: Self-pay | Admitting: Family Medicine

## 2012-02-04 ENCOUNTER — Emergency Department (HOSPITAL_COMMUNITY): Payer: Self-pay

## 2012-02-04 ENCOUNTER — Encounter (HOSPITAL_COMMUNITY): Payer: Self-pay | Admitting: *Deleted

## 2012-02-04 ENCOUNTER — Observation Stay (HOSPITAL_COMMUNITY)
Admission: EM | Admit: 2012-02-04 | Discharge: 2012-02-05 | Disposition: A | Discharge status: Home / Self Care | Payer: Self-pay | Source: Ambulatory Visit | Attending: Family Medicine | Admitting: Family Medicine

## 2012-02-04 DIAGNOSIS — Z8249 Family history of ischemic heart disease and other diseases of the circulatory system: Secondary | ICD-10-CM | POA: Insufficient documentation

## 2012-02-04 DIAGNOSIS — I10 Essential (primary) hypertension: Secondary | ICD-10-CM

## 2012-02-04 DIAGNOSIS — E669 Obesity, unspecified: Secondary | ICD-10-CM | POA: Diagnosis present

## 2012-02-04 DIAGNOSIS — R0602 Shortness of breath: Secondary | ICD-10-CM | POA: Insufficient documentation

## 2012-02-04 DIAGNOSIS — F411 Generalized anxiety disorder: Secondary | ICD-10-CM

## 2012-02-04 DIAGNOSIS — R0789 Other chest pain: Secondary | ICD-10-CM | POA: Diagnosis present

## 2012-02-04 DIAGNOSIS — R002 Palpitations: Secondary | ICD-10-CM | POA: Diagnosis present

## 2012-02-04 DIAGNOSIS — I319 Disease of pericardium, unspecified: Principal | ICD-10-CM | POA: Insufficient documentation

## 2012-02-04 DIAGNOSIS — E876 Hypokalemia: Secondary | ICD-10-CM | POA: Insufficient documentation

## 2012-02-04 HISTORY — DX: Anemia, unspecified: D64.9

## 2012-02-04 HISTORY — DX: Sprain of joints and ligaments of unspecified parts of neck, initial encounter: S13.9XXA

## 2012-02-04 HISTORY — DX: Acute upper respiratory infection, unspecified: J06.9

## 2012-02-04 HISTORY — DX: Reserved for concepts with insufficient information to code with codable children: IMO0002

## 2012-02-04 HISTORY — DX: Anxiety disorder, unspecified: F41.9

## 2012-02-04 HISTORY — DX: Angina pectoris, unspecified: I20.9

## 2012-02-04 LAB — POCT I-STAT, CHEM 8
BUN: 13 mg/dL (ref 6–23)
Chloride: 106 mEq/L (ref 96–112)
Glucose, Bld: 100 mg/dL — ABNORMAL HIGH (ref 70–99)
HCT: 37 % (ref 36.0–46.0)
Potassium: 2.7 mEq/L — CL (ref 3.5–5.1)

## 2012-02-04 LAB — CBC
HCT: 36.1 % (ref 36.0–46.0)
Hemoglobin: 12.2 g/dL (ref 12.0–15.0)
MCV: 83.6 fL (ref 78.0–100.0)
RDW: 13.2 % (ref 11.5–15.5)
WBC: 8.7 10*3/uL (ref 4.0–10.5)

## 2012-02-04 LAB — DIFFERENTIAL
Basophils Absolute: 0 10*3/uL (ref 0.0–0.1)
Eosinophils Relative: 1 % (ref 0–5)
Lymphocytes Relative: 20 % (ref 12–46)
Monocytes Absolute: 0.6 10*3/uL (ref 0.1–1.0)
Monocytes Relative: 7 % (ref 3–12)

## 2012-02-04 LAB — POCT I-STAT TROPONIN I

## 2012-02-04 MED ORDER — NITROGLYCERIN 0.4 MG SL SUBL
0.4000 mg | SUBLINGUAL_TABLET | SUBLINGUAL | Status: DC | PRN
  Administered 2012-02-04: 0.4 mg via SUBLINGUAL

## 2012-02-04 MED ORDER — MORPHINE SULFATE 4 MG/ML IJ SOLN
4.0000 mg | Freq: Once | INTRAMUSCULAR | Status: AC
  Administered 2012-02-05: 4 mg via INTRAVENOUS
  Filled 2012-02-04: qty 1

## 2012-02-04 MED ORDER — POTASSIUM CHLORIDE CRYS ER 20 MEQ PO TBCR
40.0000 meq | EXTENDED_RELEASE_TABLET | Freq: Once | ORAL | Status: AC
  Administered 2012-02-04: 40 meq via ORAL
  Filled 2012-02-04: qty 2

## 2012-02-04 MED ORDER — ONDANSETRON HCL 4 MG/2ML IJ SOLN
4.0000 mg | Freq: Once | INTRAMUSCULAR | Status: AC
  Administered 2012-02-04: 4 mg via INTRAVENOUS
  Filled 2012-02-04: qty 2

## 2012-02-04 MED ORDER — POTASSIUM CHLORIDE 10 MEQ/100ML IV SOLN
10.0000 meq | Freq: Once | INTRAVENOUS | Status: AC
  Administered 2012-02-05: 10 meq via INTRAVENOUS
  Filled 2012-02-04: qty 100

## 2012-02-04 MED ORDER — FENTANYL CITRATE 0.05 MG/ML IJ SOLN
50.0000 ug | Freq: Once | INTRAMUSCULAR | Status: AC
  Administered 2012-02-04: 50 ug via INTRAVENOUS

## 2012-02-04 MED ORDER — FENTANYL CITRATE 0.05 MG/ML IJ SOLN
50.0000 ug | Freq: Once | INTRAMUSCULAR | Status: AC
  Administered 2012-02-04: 50 ug via INTRAVENOUS
  Filled 2012-02-04: qty 2

## 2012-02-04 NOTE — ED Notes (Signed)
Pharm tech into room.

## 2012-02-04 NOTE — ED Provider Notes (Signed)
History     CSN: [id number]  Arrival date & time 02/04/12  2115   First MD Initiated Contact with Patient 02/04/12 2308      Chief Complaint  Patient presents with  . Chest Pain  . Shortness of Breath    (Consider location/radiation/quality/duration/timing/severity/associated sxs/prior treatment) HPI  46 year old female with history of hypertension, history of heart palpitation presents with chief complaints of chest pain. Patient states for the past several months she has been having intermittent bouts of chest pressure. She described the sensation as a fist pressing against the left chest.  Pain worsen with movement. She also complaining of associated headache, left arm tingling sensation, and associate shortness of breath. Symptoms worse whenever she is stressed out and usually improves when she lays stills. Today while sitting at home she experiencing the same chest pressure sensation that appears to be a persistence and lasting for several hours. She also complaining of associated shortness of breath and heart palpitation. She proceeds to call EMS to bring her to the ED. Patient states nitroglycerin was given prior to arrival has helped. Patient denies fever, chills, cough, nausea, vomiting, abdominal pain, urinary symptoms, or rash. Patient denies prior cardiac workup. Patient does admits to having a strong family history of cardiac problems. She is a nonsmoker. Pt currently endorse chest pain.    Past Medical History  Diagnosis Date  . Headache     Improved w blood pressure meds  . Heart palpitations     Since 20's - Cardiologist visit  . Hypertension     2008  . Migraines     Past Surgical History  Procedure Date  . Reattachment of finger [id number]    Family History  Problem Relation Age of Onset  . Hypertension Mother   . Diabetes Sister   . Hypertension Sister   . Heart disease Brother   . Hypertension Brother   . Diabetes Maternal Grandmother   . Heart disease  Maternal Grandmother   . Hypertension Maternal Grandmother   . Diabetes Maternal Grandfather   . Heart disease Maternal Grandfather   . Hypertension Maternal Grandfather   . Diabetes Paternal Grandmother   . Heart disease Paternal Grandmother   . Hypertension Paternal Grandmother   . Diabetes Paternal Grandfather   . Heart disease Paternal Grandfather   . Hypertension Paternal Grandfather     History  Substance Use Topics  . Smoking status: Never Smoker   . Smokeless tobacco: Never Used  . Alcohol Use: No    OB History    Grav Para Term Preterm Abortions TAB SAB Ect Mult Living   3 3 3       3       Review of Systems  All other systems reviewed and are negative.    Allergies  Latex; Other; and Septra  Home Medications   Current Outpatient Rx  Name Route Sig Dispense Refill  . CALCIUM-VITAMIN D 600-200 MG-UNIT PO CAPS Oral Take 1 tablet by mouth daily.      Marland Kitchen CLONIDINE HCL 0.1 MG PO TABS Oral Take 0.1 mg by mouth 2 (two) times daily.    Marland Kitchen HYDROCHLOROTHIAZIDE 25 MG PO TABS Oral Take 1 tablet (25 mg total) by mouth daily. 90 tablet 1  . IBUPROFEN 800 MG PO TABS Oral Take 800 mg by mouth daily. For pain     . MULTI-VITAMIN/MINERALS PO TABS Oral Take 1 tablet by mouth daily.        BP 122/72  Pulse 78  Temp(Src) 98.1 F (36.7 C) (Oral)  Resp 16  SpO2 99%  Physical Exam  Nursing note and vitals reviewed. Constitutional: She is oriented to person, place, and time. She appears well-developed and well-nourished. No distress.       Awake, alert, nontoxic appearance  HENT:  Head: Atraumatic.  Eyes: Conjunctivae are normal. Right eye exhibits no discharge. Left eye exhibits no discharge.  Neck: Neck supple.  Cardiovascular: Normal rate and regular rhythm.   Pulmonary/Chest: Effort normal. No respiratory distress. She exhibits no tenderness.  Abdominal: Soft. There is no tenderness. There is no rebound.  Musculoskeletal: She exhibits no edema and no tenderness.        ROM appears intact, no obvious focal weakness  Neurological: She is oriented to person, place, and time.       Mental status and motor strength appears intact  Skin: Skin is warm. No rash noted.  Psychiatric: She has a normal mood and affect.    ED Course  Procedures (including critical care time)  Labs Reviewed  POCT I-STAT, CHEM 8 - Abnormal; Notable for the following:    Potassium 2.7 (*)    Glucose, Bld 100 (*)    All other components within normal limits  CBC  DIFFERENTIAL  POCT I-STAT TROPONIN I   Dg Chest Portable 1 View  02/04/2012  *RADIOLOGY REPORT*  Clinical Data: Left chest, arm, and neck pain.  Shortness of breath.  PORTABLE CHEST - 1 VIEW  Comparison: 11/04/2009  Findings: Reverse lordotic angulation noted.  Cardiac and mediastinal contours appear normal.  The lungs appear clear.  No pleural effusion is identified.  IMPRESSION:  No significant abnormality identified.  Original Report Authenticated By: Dellia Cloud, M.D.     No diagnosis found.  Results for orders placed during the hospital encounter of 02/04/12  CBC      Component Value Range   WBC 8.7  4.0 - 10.5 (K/uL)   RBC 4.32  3.87 - 5.11 (MIL/uL)   Hemoglobin 12.2  12.0 - 15.0 (g/dL)   HCT 62.9  52.8 - 41.3 (%)   MCV 83.6  78.0 - 100.0 (fL)   MCH 28.2  26.0 - 34.0 (pg)   MCHC 33.8  30.0 - 36.0 (g/dL)   RDW 24.4  01.0 - 27.2 (%)   Platelets 276  150 - 400 (K/uL)  DIFFERENTIAL      Component Value Range   Neutrophils Relative 71  43 - 77 (%)   Neutro Abs 6.2  1.7 - 7.7 (K/uL)   Lymphocytes Relative 20  12 - 46 (%)   Lymphs Abs 1.8  0.7 - 4.0 (K/uL)   Monocytes Relative 7  3 - 12 (%)   Monocytes Absolute 0.6  0.1 - 1.0 (K/uL)   Eosinophils Relative 1  0 - 5 (%)   Eosinophils Absolute 0.1  0.0 - 0.7 (K/uL)   Basophils Relative 1  0 - 1 (%)   Basophils Absolute 0.0  0.0 - 0.1 (K/uL)  POCT I-STAT, CHEM 8      Component Value Range   Sodium 141  135 - 145 (mEq/L)   Potassium 2.7 (*) 3.5 - 5.1  (mEq/L)   Chloride 106  96 - 112 (mEq/L)   BUN 13  6 - 23 (mg/dL)   Creatinine, Ser 5.36  0.50 - 1.10 (mg/dL)   Glucose, Bld 644 (*) 70 - 99 (mg/dL)   Calcium, Ion 0.34  7.42 - 1.32 (mmol/L)   TCO2 23  0 -  100 (mmol/L)   Hemoglobin 12.6  12.0 - 15.0 (g/dL)   HCT 46.9  62.9 - 52.8 (%)   Comment NOTIFIED PHYSICIAN    POCT I-STAT TROPONIN I      Component Value Range   Troponin i, poc 0.01  0.00 - 0.08 (ng/mL)   Comment 3            Dg Chest Portable 1 View  02/04/2012  *RADIOLOGY REPORT*  Clinical Data: Left chest, arm, and neck pain.  Shortness of breath.  PORTABLE CHEST - 1 VIEW  Comparison: 11/04/2009  Findings: Reverse lordotic angulation noted.  Cardiac and mediastinal contours appear normal.  The lungs appear clear.  No pleural effusion is identified.  IMPRESSION:  No significant abnormality identified.  Original Report Authenticated By: Dellia Cloud, M.D.     Date: 02/04/2012  Rate: 79  Rhythm: normal sinus rhythm  QRS Axis: normal  Intervals: normal  ST/T Wave abnormalities: normal  Conduction Disutrbances:none  Narrative Interpretation:   Old EKG Reviewed: unchanged    MDM  Persistent chest pain, but atypical.  No prior cardiac work up. ECG normal, neg troponin, K+2.7 but no evidence of changes on EKG, supplementation given.  Due to chronicity of sxs and lack of risk factors for PE, my suspicion of PE is low.  CXR unremarkable.  Will consult with [facility] for admission for cardiac rule out tomorrow.  Discussed care with my attending.    12:21 AM I have consulted with [facility] who agrees to see pt in ED and will admit pt to tele bed, under the care of attending Dr. Mauricio Po. Will give ativan and order 2nd set of cardiac enzyme.  Pt currently in NAD.  VSS.        Fayrene Helper, PA-C 02/05/12 [phone]

## 2012-02-04 NOTE — ED Notes (Signed)
EDP notified of 2.7 K+

## 2012-02-04 NOTE — ED Notes (Signed)
Pt arrives by EMS from home, here for CP, describes as ss chest tightness, also sob,  was 8/10  (denied: radiation, nvd, fever, cough, congestion, cold sx, dizziness, diaphoresis or other sx), NSR on monitor, h/o htn with crisis and palpitaitons, IV in place, ASA 324 mg taken at home, EMS gave ntg 2 sl, pain decresed from 8/10 down to 6/10.

## 2012-02-04 NOTE — ED Notes (Signed)
EDPA out of room, finished at BS.  

## 2012-02-05 ENCOUNTER — Encounter (HOSPITAL_COMMUNITY): Payer: Self-pay | Admitting: Family Medicine

## 2012-02-05 DIAGNOSIS — R0789 Other chest pain: Secondary | ICD-10-CM

## 2012-02-05 DIAGNOSIS — R079 Chest pain, unspecified: Secondary | ICD-10-CM

## 2012-02-05 HISTORY — DX: Other chest pain: R07.89

## 2012-02-05 LAB — BASIC METABOLIC PANEL
CO2: 24 mEq/L (ref 19–32)
Chloride: 107 mEq/L (ref 96–112)
GFR calc non Af Amer: 72 mL/min — ABNORMAL LOW (ref >90)
Glucose, Bld: 101 mg/dL — ABNORMAL HIGH (ref 70–99)
Potassium: 3.4 mEq/L — ABNORMAL LOW (ref 3.5–5.1)
Sodium: 140 mEq/L (ref 135–145)

## 2012-02-05 LAB — CARDIAC PANEL(CRET KIN+CKTOT+MB+TROPI)
CK, MB: 1.3 ng/mL (ref 0.3–4.0)
Relative Index: INVALID (ref 0.0–2.5)
Relative Index: INVALID (ref 0.0–2.5)
Total CK: 77 U/L (ref 7–177)
Total CK: 76 U/L (ref 7–177)
Troponin I: <0.3 ng/mL (ref <0.30)

## 2012-02-05 LAB — MRSA PCR SCREENING: MRSA by PCR: NEGATIVE

## 2012-02-05 LAB — SEDIMENTATION RATE: Sed Rate: 20 mm/hr (ref 0–22)

## 2012-02-05 LAB — TROPONIN I: Troponin I: <0.3 ng/mL (ref <0.30)

## 2012-02-05 LAB — CBC
MCHC: 33.3 g/dL (ref 30.0–36.0)
Platelets: 244 10*3/uL (ref 150–400)
RDW: 13.3 % (ref 11.5–15.5)

## 2012-02-05 LAB — LIPID PANEL
Cholesterol: 160 mg/dL (ref 0–200)
Triglycerides: 67 mg/dL (ref <150)

## 2012-02-05 MED ORDER — ONDANSETRON HCL 4 MG/2ML IJ SOLN: 4.0000 mg | Freq: Four times a day (QID) | INTRAMUSCULAR | Status: DC | PRN

## 2012-02-05 MED ORDER — ACETAMINOPHEN 325 MG PO TABS
650.0000 mg | ORAL_TABLET | ORAL | Status: DC | PRN
  Administered 2012-02-05: 650 mg via ORAL
  Filled 2012-02-05: qty 2

## 2012-02-05 MED ORDER — POTASSIUM CHLORIDE CRYS ER 20 MEQ PO TBCR
20.0000 meq | EXTENDED_RELEASE_TABLET | Freq: Three times a day (TID) | ORAL | Status: DC
  Administered 2012-02-05 (×2): 20 meq via ORAL
  Filled 2012-02-05: qty 1

## 2012-02-05 MED ORDER — ACETAMINOPHEN 325 MG PO TABS: 650.0000 mg | ORAL_TABLET | ORAL | Status: DC | PRN

## 2012-02-05 MED ORDER — ASPIRIN 81 MG PO CHEW: 324.0000 mg | CHEWABLE_TABLET | ORAL | Status: DC

## 2012-02-05 MED ORDER — LORAZEPAM 1 MG PO TABS
1.0000 mg | ORAL_TABLET | Freq: Once | ORAL | Status: AC
Start: 2012-02-05 — End: 2012-02-05
  Administered 2012-02-05: 1 mg via ORAL
  Filled 2012-02-05: qty 1

## 2012-02-05 MED ORDER — ASPIRIN 300 MG RE SUPP: 300.0000 mg | RECTAL | Status: DC

## 2012-02-05 MED ORDER — CLONIDINE HCL 0.1 MG PO TABS
0.1000 mg | ORAL_TABLET | Freq: Two times a day (BID) | ORAL | Status: DC
  Administered 2012-02-05: 0.1 mg via ORAL
  Filled 2012-02-05 (×2): qty 1

## 2012-02-05 MED ORDER — PANTOPRAZOLE SODIUM 40 MG PO TBEC
40.0000 mg | DELAYED_RELEASE_TABLET | Freq: Every day | ORAL | Status: DC
  Administered 2012-02-05: 20 mg via ORAL

## 2012-02-05 MED ORDER — ASPIRIN EC 81 MG PO TBEC: 81.0000 mg | DELAYED_RELEASE_TABLET | Freq: Every day | ORAL | Status: DC

## 2012-02-05 MED ORDER — HYDROCHLOROTHIAZIDE 25 MG PO TABS
25.0000 mg | ORAL_TABLET | Freq: Every day | ORAL | Status: DC
  Administered 2012-02-05: 25 mg via ORAL
  Filled 2012-02-05: qty 1

## 2012-02-05 MED ORDER — IBUPROFEN 400 MG PO TABS
400.0000 mg | ORAL_TABLET | Freq: Four times a day (QID) | ORAL | Status: DC | PRN
  Administered 2012-02-05: 400 mg via ORAL
  Filled 2012-02-05: qty 2
  Filled 2012-02-05: qty 1

## 2012-02-05 MED ORDER — GI COCKTAIL ~~LOC~~
30.0000 mL | Freq: Once | ORAL | Status: AC
  Administered 2012-02-05: 30 mL via ORAL
  Filled 2012-02-05: qty 30

## 2012-02-05 MED ORDER — ASPIRIN 81 MG PO TBEC: 81.0000 mg | DELAYED_RELEASE_TABLET | Freq: Every day | ORAL | Status: AC

## 2012-02-05 MED ORDER — IBUPROFEN 600 MG PO TABS
600.0000 mg | ORAL_TABLET | Freq: Four times a day (QID) | ORAL | Status: DC
  Administered 2012-02-05: 600 mg via ORAL
  Filled 2012-02-05 (×3): qty 1

## 2012-02-05 MED ORDER — IBUPROFEN 600 MG PO TABS: 600.0000 mg | ORAL_TABLET | Freq: Four times a day (QID) | ORAL | Status: AC

## 2012-02-05 MED ORDER — ENOXAPARIN SODIUM 40 MG/0.4ML ~~LOC~~ SOLN
40.0000 mg | SUBCUTANEOUS | Status: DC
  Administered 2012-02-05: 40 mg via SUBCUTANEOUS
  Filled 2012-02-05: qty 0.4

## 2012-02-05 MED ORDER — PANTOPRAZOLE SODIUM 20 MG PO TBEC
20.0000 mg | DELAYED_RELEASE_TABLET | Freq: Every day | ORAL | Status: DC
  Filled 2012-02-05: qty 1

## 2012-02-05 MED ORDER — POTASSIUM CHLORIDE CRYS ER 20 MEQ PO TBCR
40.0000 meq | EXTENDED_RELEASE_TABLET | Freq: Once | ORAL | Status: AC
  Administered 2012-02-05: 40 meq via ORAL
  Filled 2012-02-05: qty 2

## 2012-02-05 NOTE — Discharge Instructions (Signed)
The chest pain you were experiencing was not due to a heart attack. All the labs were normal regarding that aspect. There was also no evidence of blood clot in your lungs explaining the pain. The pain you are having could be due to an inflammation in the fluid around your heart. The treatment for that is ibuprofen to take every 4 hours as a scheduled dose.   Your potassium was a little low, so we gave you some potassium in the hospital. When you follow up with your doctor next week, he will probably check it again to make sure it isn't still low.

## 2012-02-05 NOTE — Progress Notes (Signed)
Patient [name], 46 year old female reports that she is struggling with chest pains, PTSD, anxiety, and depression.  Patient appears calm, hopeful, and open to conversation.  She thanked Orthoptist for [employer] conversation and presence.  I will follow-up later today.
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
## 2012-02-05 NOTE — ED Notes (Signed)
Admitting MD at BS.  

## 2012-02-05 NOTE — ED Notes (Signed)
Pending admission orders & bed request, rates CP 5/10.

## 2012-02-05 NOTE — ED Notes (Signed)
Admitting MD at [facility]. meds given for pain and anxiety. Pt alert, NAD, calm, interactive, skin W&D, resps e/u, speaking in clear complete sentences.

## 2012-02-05 NOTE — ED Provider Notes (Signed)
Medical screening examination/treatment/procedure(s) were conducted as a shared visit with non-physician practitioner(s) and myself.  I personally evaluated the patient during the encounter  RRR, CTAB. Atypical CP x > 5hours. Intermittent x months. Brother with h/o MI at age 46yo per patient. Cont cp in the ED. Pain control, repeat CE. Consider admission for formal r/o.  Forbes Cellar, MD 02/05/12 [phone]

## 2012-02-05 NOTE — Progress Notes (Signed)
*  PRELIMINARY RESULTS* Echocardiogram 2D Echocardiogram has been performed.  [name] 02/05/2012, 3:32 PM

## 2012-02-05 NOTE — ED Notes (Signed)
Lab called for blood draw, report called to 2500 RN, pt sleeping.
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
## 2012-02-12 ENCOUNTER — Inpatient Hospital Stay: Payer: Self-pay | Admitting: Family Medicine

## 2012-02-18 ENCOUNTER — Encounter: Payer: Self-pay | Admitting: Family Medicine

## 2012-02-18 ENCOUNTER — Ambulatory Visit (INDEPENDENT_AMBULATORY_CARE_PROVIDER_SITE_OTHER): Payer: Self-pay | Admitting: Family Medicine

## 2012-02-18 VITALS — BP 130/86 | HR 94 | Temp 98.3°F | Ht 68.0 in | Wt 214.0 lb

## 2012-02-18 DIAGNOSIS — IMO0002 Reserved for concepts with insufficient information to code with codable children: Secondary | ICD-10-CM

## 2012-02-18 DIAGNOSIS — I309 Acute pericarditis, unspecified: Secondary | ICD-10-CM

## 2012-02-18 MED ORDER — CITALOPRAM HYDROBROMIDE 20 MG PO TABS: ORAL_TABLET | ORAL | Status: DC

## 2012-02-18 MED ORDER — BUSPIRONE HCL 7.5 MG PO TABS: 7.5000 mg | ORAL_TABLET | Freq: Two times a day (BID) | ORAL | Status: DC

## 2012-02-18 MED ORDER — CITALOPRAM HYDROBROMIDE 20 MG PO TABS: 20.0000 mg | ORAL_TABLET | Freq: Every day | ORAL | Status: DC

## 2012-02-18 NOTE — Assessment & Plan Note (Signed)
Patient is still having trouble coping with this and I do feel that she is in depression. We will treat with Celexa as well as BuSpar due to patient having anxiety most of the day. Patient given side effects that are potentially occurring with these medications. Patient will followup in 2 weeks and hasn't complained in case she has worsening symptoms. Patient will continue therapy Face-to-face time with patient possibly 40 minutes.

## 2012-02-18 NOTE — Patient Instructions (Addendum)
It is very good to see you. You aren't very strong individual. I am here if you need me. I'm giving you to do medications. The first one is called Celexa. You can take one pill daily for the first week then 2 pills daily thereafter. The other one is called BuSpar. In the take one pill twice a day for your anxiety. I have made an appointment and I will see you in 2 weeks. Please do not hesitate to call if you have any changes in your feelings.
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
## 2012-02-19 ENCOUNTER — Telehealth: Payer: Self-pay | Admitting: Family Medicine

## 2012-02-19 MED ORDER — PROPRANOLOL HCL ER 60 MG PO CP24: 60.0000 mg | ORAL_CAPSULE | Freq: Every day | ORAL | Status: DC

## 2012-02-19 NOTE — Telephone Encounter (Signed)
Pt was here yesterday - pharmacy told her that the Buspar would be $69 and she cannot afford that.  Wants to know if there is something similar that she can afford.  [facility]- [address]

## 2012-02-19 NOTE — Telephone Encounter (Signed)
Called patient back made her aware of change her medication to hopefully will be cheaper. Border potential side effects patient verbalized understanding.

## 2012-02-19 NOTE — Telephone Encounter (Signed)
Will forward to Dr.Idania Desouza

## 2012-03-05 ENCOUNTER — Ambulatory Visit: Payer: Self-pay | Admitting: Family Medicine

## 2012-03-17 ENCOUNTER — Ambulatory Visit: Payer: Self-pay | Admitting: Family Medicine

## 2012-05-07 ENCOUNTER — Ambulatory Visit (INDEPENDENT_AMBULATORY_CARE_PROVIDER_SITE_OTHER): Payer: Self-pay | Admitting: Advanced Practice Midwife

## 2012-05-07 VITALS — BP 119/88 | HR 73 | Temp 98.3°F | Ht 68.0 in | Wt 202.5 lb

## 2012-05-07 DIAGNOSIS — T7421XA Adult sexual abuse, confirmed, initial encounter: Secondary | ICD-10-CM

## 2012-05-07 DIAGNOSIS — Z0000 Encounter for general adult medical examination without abnormal findings: Secondary | ICD-10-CM

## 2012-05-07 DIAGNOSIS — Z01419 Encounter for gynecological examination (general) (routine) without abnormal findings: Secondary | ICD-10-CM

## 2012-05-07 DIAGNOSIS — Z2089 Contact with and (suspected) exposure to other communicable diseases: Secondary | ICD-10-CM

## 2012-05-07 DIAGNOSIS — Z202 Contact with and (suspected) exposure to infections with a predominantly sexual mode of transmission: Secondary | ICD-10-CM

## 2012-05-07 DIAGNOSIS — N83209 Unspecified ovarian cyst, unspecified side: Secondary | ICD-10-CM

## 2012-05-07 DIAGNOSIS — R109 Unspecified abdominal pain: Secondary | ICD-10-CM

## 2012-05-07 DIAGNOSIS — IMO0002 Reserved for concepts with insufficient information to code with codable children: Secondary | ICD-10-CM

## 2012-05-07 LAB — POCT URINALYSIS DIP (DEVICE)
Glucose, UA: NEGATIVE mg/dL
Ketones, ur: NEGATIVE mg/dL
Specific Gravity, Urine: >=1.03 (ref 1.005–1.030)

## 2012-05-07 LAB — POCT PREGNANCY, URINE: Preg Test, Ur: NEGATIVE
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
## 2012-05-07 NOTE — Patient Instructions (Addendum)
We have collected tests today for Vaginal infections and Urinary Tract infections.  If any of these tests are positive we will be in touch with you regarding treatment. You are dehydrated and need to drink plenty of fluids. Please follow up with Dr. Durene Cal at [facility] to discuss your allergy symptoms.  Return in 1 week for your DEPO shot Please make your appointment for your Pelvic Ultrasound to follow up your ovarian cysts   aOvarian Cyst The ovaries are small organs that are on each side of the uterus. The ovaries are the organs that produce the female hormones, estrogen and progesterone. An ovarian cyst is a sac filled with fluid that can vary in its size. It is normal for a small cyst to form in women who are in the childbearing age and who have menstrual periods. This type of cyst is called a follicle cyst that becomes an ovulation cyst (corpus luteum cyst) after it produces the women's egg. It later goes away on its own if the woman does not become pregnant. There are other kinds of ovarian cysts that may cause problems and may need to be treated. The most serious problem is a cyst with cancer. It should be noted that menopausal women who have an ovarian cyst are at a higher risk of it being a cancer cyst. They should be evaluated very quickly, thoroughly and followed closely. This is especially true in menopausal women because of the high rate of ovarian cancer in women in menopause. CAUSES AND TYPES OF OVARIAN CYSTS:  FUNCTIONAL CYST: The follicle/corpus luteum cyst is a functional cyst that occurs every month during ovulation with the menstrual cycle. They go away with the next menstrual cycle if the woman does not get pregnant. Usually, there are no symptoms with a functional cyst.   ENDOMETRIOMA CYST: This cyst develops from the lining of the uterus tissue. This cyst gets in or on the ovary. It grows every month from the bleeding during the menstrual period. It is  also called a "chocolate cyst" because it becomes filled with blood that turns brown. This cyst can cause pain in the lower abdomen during intercourse and with your menstrual period.   CYSTADENOMA CYST: This cyst develops from the cells on the outside of the ovary. They usually are not cancerous. They can get very big and cause lower abdomen pain and pain with intercourse. This type of cyst can twist on itself, cut off its blood supply and cause severe pain. It also can easily rupture and cause a lot of pain.   DERMOID CYST: This type of cyst is sometimes found in both ovaries. They are found to have different kinds of body tissue in the cyst. The tissue includes skin, teeth, hair, and/or cartilage. They usually do not have symptoms unless they get very big. Dermoid cysts are rarely cancerous.   POLYCYSTIC OVARY: This is a rare condition with hormone problems that produces many small cysts on both ovaries. The cysts are follicle-like cysts that never produce an egg and become a corpus luteum. It can cause an increase in body weight, infertility, acne, increase in body and facial hair and lack of menstrual periods or rare menstrual periods. Many women with this problem develop type 2 diabetes. The exact cause of this problem is unknown. A polycystic ovary is rarely cancerous.   THECA LUTEIN CYST: Occurs when too much hormone (human chorionic gonadotropin) is produced and over-stimulates the ovaries to produce an egg. They are  frequently seen when doctors stimulate the ovaries for invitro-fertilization (test tube babies).   LUTEOMA CYST: This cyst is seen during pregnancy. Rarely it can cause an obstruction to the birth canal during labor and delivery. They usually go away after delivery.  SYMPTOMS   Pelvic pain or pressure.   Pain during sexual intercourse.   Increasing girth (swelling) of the abdomen.   Abnormal menstrual periods.   Increasing pain with menstrual periods.   You stop having  menstrual periods and you are not pregnant.  DIAGNOSIS  The diagnosis can be made during:  Routine or annual pelvic examination (common).   Ultrasound.   X-ray of the pelvis.   CT Scan.   MRI.   Blood tests.  TREATMENT   Treatment may only be to follow the cyst monthly for 2 to 3 months with your caregiver. Many go away on their own, especially functional cysts.   May be aspirated (drained) with a long needle with ultrasound, or by laparoscopy (inserting a tube into the pelvis through a small incision).   The whole cyst can be removed by laparoscopy.   Sometimes the cyst may need to be removed through an incision in the lower abdomen.   Hormone treatment is sometimes used to help dissolve certain cysts.   Birth control pills are sometimes used to help dissolve certain cysts.  HOME CARE INSTRUCTIONS  Follow your caregiver's advice regarding:  Medicine.   Follow up visits to evaluate and treat the cyst.   You may need to come back or make an appointment with another caregiver, to find the exact cause of your cyst, if your caregiver is not a gynecologist.   Get your yearly and recommended pelvic examinations and Pap tests.   Let your caregiver know if you have had an ovarian cyst in the past.  SEEK MEDICAL CARE IF:   Your periods are late, irregular, they stop, or are painful.   Your stomach (abdomen) or pelvic pain does not go away.   Your stomach becomes larger or swollen.   You have pressure on your bladder or trouble emptying your bladder completely.   You have painful sexual intercourse.   You have feelings of fullness, pressure, or discomfort in your stomach.   You lose weight for no apparent reason.   You feel generally ill.   You become constipated.   You lose your appetite.   You develop acne.   You have an increase in body and facial hair.   You are gaining weight, without changing your exercise and eating habits.   You think you are  pregnant.  SEEK IMMEDIATE MEDICAL CARE IF:   You have increasing abdominal pain.   You feel sick to your stomach (nausea) and/or vomit.   You develop a fever that comes on suddenly.   You develop abdominal pain during a bowel movement.   Your menstrual periods become heavier than usual.  Document Released: 09/08/2005 Document Revised: 08/28/2011 Document Reviewed: 07/12/2009 [facility] Patient Information 2012 [place], [place].

## 2012-05-08 LAB — WET PREP, GENITAL
Clue Cells Wet Prep HPF POC: NONE SEEN
Trich, Wet Prep: NONE SEEN
Yeast Wet Prep HPF POC: NONE SEEN

## 2012-05-09 LAB — URINE CULTURE: Colony Count: 4000

## 2012-05-10 ENCOUNTER — Ambulatory Visit: Payer: Self-pay

## 2012-05-12 ENCOUNTER — Encounter: Payer: Self-pay | Admitting: Advanced Practice Midwife

## 2012-05-14 ENCOUNTER — Ambulatory Visit: Payer: Self-pay

## 2012-05-21 ENCOUNTER — Ambulatory Visit (HOSPITAL_COMMUNITY): Payer: Self-pay

## 2012-05-27 ENCOUNTER — Encounter (HOSPITAL_COMMUNITY): Payer: Self-pay | Admitting: Emergency Medicine

## 2012-05-27 ENCOUNTER — Emergency Department (HOSPITAL_COMMUNITY)
Admission: EM | Admit: 2012-05-27 | Discharge: 2012-05-27 | Disposition: A | Discharge status: Home / Self Care | Payer: 59 | Attending: Emergency Medicine | Admitting: Emergency Medicine

## 2012-05-27 ENCOUNTER — Emergency Department (HOSPITAL_COMMUNITY): Payer: 59

## 2012-05-27 DIAGNOSIS — M79609 Pain in unspecified limb: Secondary | ICD-10-CM | POA: Insufficient documentation

## 2012-05-27 DIAGNOSIS — Z9109 Other allergy status, other than to drugs and biological substances: Secondary | ICD-10-CM | POA: Insufficient documentation

## 2012-05-27 DIAGNOSIS — M79646 Pain in unspecified finger(s): Secondary | ICD-10-CM

## 2012-05-27 DIAGNOSIS — Z888 Allergy status to other drugs, medicaments and biological substances status: Secondary | ICD-10-CM | POA: Insufficient documentation

## 2012-05-27 DIAGNOSIS — Z833 Family history of diabetes mellitus: Secondary | ICD-10-CM | POA: Insufficient documentation

## 2012-05-27 DIAGNOSIS — Z8249 Family history of ischemic heart disease and other diseases of the circulatory system: Secondary | ICD-10-CM | POA: Insufficient documentation

## 2012-05-27 DIAGNOSIS — Z91040 Latex allergy status: Secondary | ICD-10-CM | POA: Insufficient documentation

## 2012-05-27 DIAGNOSIS — Z7982 Long term (current) use of aspirin: Secondary | ICD-10-CM | POA: Insufficient documentation

## 2012-05-27 DIAGNOSIS — F3289 Other specified depressive episodes: Secondary | ICD-10-CM | POA: Insufficient documentation

## 2012-05-27 DIAGNOSIS — I10 Essential (primary) hypertension: Secondary | ICD-10-CM | POA: Insufficient documentation

## 2012-05-27 DIAGNOSIS — F329 Major depressive disorder, single episode, unspecified: Secondary | ICD-10-CM | POA: Insufficient documentation

## 2012-05-27 DIAGNOSIS — F411 Generalized anxiety disorder: Secondary | ICD-10-CM | POA: Insufficient documentation

## 2012-05-27 NOTE — Progress Notes (Signed)
Orthopedic Tech Progress Note Patient Details:  [name] 26-Nov-1965 [id number]  Ortho Devices Type of Ortho Device: Finger splint Ortho Device/Splint Location: left hand Ortho Device/Splint Interventions: Application   Nikki Dom 05/27/2012, 3:23 PM
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
## 2012-05-27 NOTE — ED Notes (Signed)
Left pinkie finger pain x 2 weeks  Some swelling hurt at work

## 2012-05-27 NOTE — ED Notes (Signed)
Pt d/c home in NAD. Pt voiced understanding of d/c instructions and follow up care.  

## 2012-05-28 ENCOUNTER — Ambulatory Visit (HOSPITAL_COMMUNITY): Admission: RE | Admit: 2012-05-28 | Payer: 59 | Source: Ambulatory Visit

## 2012-06-03 ENCOUNTER — Ambulatory Visit: Payer: 59

## 2012-07-28 ENCOUNTER — Other Ambulatory Visit: Payer: Self-pay | Admitting: *Deleted

## 2012-07-28 MED ORDER — HYDROCHLOROTHIAZIDE 25 MG PO TABS: 25.0000 mg | ORAL_TABLET | Freq: Every day | ORAL | Status: DC

## 2012-09-10 ENCOUNTER — Ambulatory Visit (INDEPENDENT_AMBULATORY_CARE_PROVIDER_SITE_OTHER): Payer: 59 | Admitting: Family Medicine

## 2012-09-10 ENCOUNTER — Ambulatory Visit: Payer: 59 | Admitting: Family Medicine

## 2012-09-10 VITALS — BP 145/101 | HR 67 | Temp 98.1°F | Ht 68.0 in | Wt 207.0 lb

## 2012-09-10 DIAGNOSIS — H9209 Otalgia, unspecified ear: Secondary | ICD-10-CM | POA: Insufficient documentation

## 2012-09-10 DIAGNOSIS — J309 Allergic rhinitis, unspecified: Secondary | ICD-10-CM | POA: Insufficient documentation

## 2012-09-10 DIAGNOSIS — J302 Other seasonal allergic rhinitis: Secondary | ICD-10-CM

## 2012-09-10 DIAGNOSIS — H1013 Acute atopic conjunctivitis, bilateral: Secondary | ICD-10-CM | POA: Insufficient documentation

## 2012-09-10 MED ORDER — MONTELUKAST SODIUM 10 MG PO TABS: 10.0000 mg | ORAL_TABLET | Freq: Every day | ORAL | Status: DC

## 2012-09-10 NOTE — Assessment & Plan Note (Signed)
>>  ASSESSMENT AND PLAN FOR SEASONAL ALLERGIES WRITTEN ON 09/10/2012  5:20 PM BY MCGILL, JACQUELYN A, MD  Will try Singulair to see if it helps with symptoms.  No red flags on exam.  Given duration, unlikely to be infectious.  F/u 1 month.  Could consider adding nose spray at that time.

## 2012-09-10 NOTE — Assessment & Plan Note (Signed)
Will try Singulair to see if it helps with symptoms.  No red flags on exam.  Given duration, unlikely to be infectious.  F/u 1 month.  Could consider adding nose spray at that time.

## 2012-09-10 NOTE — Progress Notes (Signed)
S: Pt comes in today for SDA for ear pain and cough.  Patient reports bilateral ear pain since 05/2012.  Pain is throbbing, constant.  Worse with laying down, nothing seems to make better.  Takes ibuprofen QID for chronic pain but this hasn't helped her ears.  Hasn't tried anything else.  No drainage.  No h/o ear problems.  Has also been having throat pain for the past 2-3 weeks.  Initially started with pain and swelling on the left side of her throat and then became painful to swallow, stabbing sensation with food/drink.  Has also had a cough for the past few weeks, which she thinks is from throat irritation.  No SOB, no mucus or hemoptysis.  Has tried hot tea with lemon which hasn't helped.  Nothing seems to make it better.  Throat and cough are worse when she sleeps with the heat on so has been turning it off at bed time.  Came in today because she is "just tired of all this."  And her voice was hoarse when she woke up this AM.   No fevers/chills. + congestion, no rhinorrhea   ROS: Per HPI  History  Smoking status  . Never Smoker   Smokeless tobacco  . Never Used    O:  Filed Vitals:   09/10/12 1413  BP: 145/101  Pulse: 67  Temp: 98.1 F (36.7 C)    Gen: NAD HEENT: MMM, no pharyngeal erythema or exudate, moderate anterior cervical LAD, nasal mucosa normal, TMs and canals normal bilaterally  CV: RRR, no murmur Pulm: CTA bilat, no wheezes or crackles   A/P: 46 y.o. female p/w ear pain and sore throat -See problem list -f/u in 1 month with PCP

## 2012-09-10 NOTE — Patient Instructions (Signed)
It was nice to meet you today.  The good news is, I don't think your symptoms are from an infection.  They might be from some allergies.  I am starting you on an allergy medicine.    Come back to see Dr. Durene Cal in 3-6 weeks to see if the medicine is helping your symptoms.

## 2012-09-10 NOTE — Assessment & Plan Note (Signed)
TMs and canals normal.  May be 2/2 congestion from probably allergies. Try singulair.

## 2012-11-10 ENCOUNTER — Telehealth: Payer: Self-pay | Admitting: Family Medicine

## 2012-11-10 MED ORDER — CLONIDINE HCL 0.1 MG PO TABS: 0.1000 mg | ORAL_TABLET | Freq: Two times a day (BID) | ORAL | Status: DC

## 2012-11-10 NOTE — Telephone Encounter (Signed)
Refilled clonidine. Patient needs appointment for follow up of her hypertension. I have not yet met the patient. Will ask staff to inform patient.

## 2012-11-10 NOTE — Telephone Encounter (Signed)
Will forward to Dr  Durene Cal

## 2012-11-10 NOTE — Telephone Encounter (Signed)
Patient needs a refill on her    cloNIDine   Sent to [address] on [address].  Her pharmacy was to send the request over 2 weeks ago.

## 2012-11-11 NOTE — Telephone Encounter (Signed)
Attempted to reach pt, one # does not have VM set up and the other is not a working #, LMO mother's machine to have pt call us so we can give her the msg from Dr Durene Cal.

## 2012-11-17 ENCOUNTER — Ambulatory Visit: Payer: 59 | Admitting: Family Medicine

## 2012-11-25 ENCOUNTER — Ambulatory Visit: Payer: 59 | Admitting: Family Medicine

## 2012-11-25 ENCOUNTER — Encounter: Payer: Self-pay | Admitting: Family Medicine

## 2013-01-31 ENCOUNTER — Other Ambulatory Visit: Payer: Self-pay | Admitting: *Deleted

## 2013-01-31 ENCOUNTER — Ambulatory Visit: Payer: 59 | Admitting: Obstetrics & Gynecology

## 2013-01-31 MED ORDER — HYDROCHLOROTHIAZIDE 25 MG PO TABS: 25.0000 mg | ORAL_TABLET | Freq: Every day | ORAL | Status: DC

## 2013-02-01 ENCOUNTER — Other Ambulatory Visit (HOSPITAL_COMMUNITY)
Admission: RE | Admit: 2013-02-01 | Discharge: 2013-02-01 | Disposition: A | Discharge status: Home / Self Care | Payer: Self-pay | Source: Ambulatory Visit | Attending: Family Medicine | Admitting: Family Medicine

## 2013-02-01 ENCOUNTER — Encounter: Payer: Self-pay | Admitting: Family Medicine

## 2013-02-01 ENCOUNTER — Ambulatory Visit (INDEPENDENT_AMBULATORY_CARE_PROVIDER_SITE_OTHER): Payer: Self-pay | Admitting: Family Medicine

## 2013-02-01 ENCOUNTER — Telehealth: Payer: Self-pay | Admitting: Family Medicine

## 2013-02-01 VITALS — BP 130/87 | HR 73 | Ht 68.0 in | Wt 209.0 lb

## 2013-02-01 DIAGNOSIS — M549 Dorsalgia, unspecified: Secondary | ICD-10-CM

## 2013-02-01 DIAGNOSIS — Z113 Encounter for screening for infections with a predominantly sexual mode of transmission: Secondary | ICD-10-CM | POA: Insufficient documentation

## 2013-02-01 DIAGNOSIS — N949 Unspecified condition associated with female genital organs and menstrual cycle: Secondary | ICD-10-CM

## 2013-02-01 DIAGNOSIS — N898 Other specified noninflammatory disorders of vagina: Secondary | ICD-10-CM

## 2013-02-01 DIAGNOSIS — R109 Unspecified abdominal pain: Secondary | ICD-10-CM

## 2013-02-01 DIAGNOSIS — N83209 Unspecified ovarian cyst, unspecified side: Secondary | ICD-10-CM

## 2013-02-01 LAB — POCT WET PREP (WET MOUNT): Clue Cells Wet Prep Whiff POC: NEGATIVE

## 2013-02-01 LAB — POCT URINALYSIS DIPSTICK
Blood, UA: NEGATIVE
Protein, UA: NEGATIVE
Spec Grav, UA: 1.025
Urobilinogen, UA: 0.2

## 2013-02-01 MED ORDER — METRONIDAZOLE 500 MG PO TABS: 500.0000 mg | ORAL_TABLET | Freq: Two times a day (BID) | ORAL | Status: DC

## 2013-02-01 MED ORDER — IBUPROFEN 600 MG PO TABS: 600.0000 mg | ORAL_TABLET | Freq: Four times a day (QID) | ORAL | Status: DC | PRN

## 2013-02-01 NOTE — Patient Instructions (Signed)
It was nice to meet you today.  Your urine results look negative, so this is not a urine infection. It could be from your ovary that was painful in the past. Your other results should be back later today and tomorrow so I will call you.  For your pain, I would try taking Aleve twice daily. It is long acting and has good indication for "women's pain". If anything changes or you fail to improve please return to clinic.  Sukaina Toothaker M. Elwin Tsou, M.D.

## 2013-02-01 NOTE — Telephone Encounter (Signed)
I have called patient and discussed results. Flagyl sent to pharmacy.  Amber M. Hairford, M.D. 02/01/2013 4:00 PM

## 2013-02-01 NOTE — Telephone Encounter (Signed)
Patient is calling to get the results of the wet prep and she also wanted to let Dr. Mikel Cella know that after the palpations and the examination, she has been tender and the hemorrhoids are now really bothering her again.

## 2013-02-01 NOTE — Telephone Encounter (Signed)
To MD for abnormal wet prep and rest of message. [name], [name]

## 2013-02-01 NOTE — Progress Notes (Signed)
Patient ID: [name], female   DOB: 10-Sep-1966, 47 y.o.   MRN: [id number]  [facility] Amber M. Hairford, MD Phone: [phone]   Subjective: HPI: Patient is a 47 y.o. female presenting to clinic today for same day visit for abdominal pain.  Abdominal pain started a few weeks ago, been getting worse. Pain is suprapubic, left and right lower quadrants. Also has associated flank pain bilaterally which is tender to palpation. Denies urinary frequency, some dysuria. No blood in urine. No strong odor to urine. No vaginal discharge. LMP 6 months ago, is sexually active. Does have a new partner for 2 months, but she is not aware of any STD history. Did have history of kidney stones as a child but none recently.   Pt endorses fevers up to 101. Some nausea, no vomiting.   History Reviewed: Non-smoker  ROS: Please see HPI above.  Objective: Office vital signs reviewed. BP 130/87  Pulse 73  Ht 5\' 8"  (1.727 m)  Wt 209 lb (94.802 kg)  BMI 31.79 kg/m2  Physical Examination:  General: Awake, alert. NAD. Lying on exam table entire interview/exam HEENT: Atraumatic, normocephalic. MMM Pulm: CTAB, no wheezes Cardio: RRR, no murmurs appreciated Abdomen:+BS, soft, non-distended. Mild TTP entire lower abdomen without focal tenderness. No rebound. No CVA. GU: External genitalia wnl. No rashes or lesions. Cervix difficult to visualize. Scant white, non odorous discharge in vaginal. Wet prep and Gen probe collected. No CMT. Some tenderness on right adnexa.  Extremities: No edema Neuro: Grossly intact  Assessment: 47 y.o. female with abdominal pain.  Plan: See Problem List and After Visit Summary

## 2013-02-02 DIAGNOSIS — R109 Unspecified abdominal pain: Secondary | ICD-10-CM | POA: Insufficient documentation

## 2013-02-02 DIAGNOSIS — R1031 Right lower quadrant pain: Secondary | ICD-10-CM

## 2013-02-02 HISTORY — DX: Right lower quadrant pain: R10.31

## 2013-02-02 NOTE — Assessment & Plan Note (Signed)
Unclear etiology. UA was not remarkable for UTI and preg test negative. Wet prep showed a few clue cells which could cause discomfort, so will treat with Flagyl. Awaiting GC/Ch probe. Bimanual exam rather unremarkable so I do not think this is PID. She could possibly have an ovarian cyst especially on the right causing come pain. Pt can take OTC Aleve for discomfort and follow up with PCP.  Addendum: Pt called to report that since her pelvic exam, she has been problems with hemorrhoids. She states that when I checked for CMT, I must have caused her hemorrhoids to come out. She did not have any external hemorrhoids on my exam. I explained that these were 2 separate things and likely not related. It is possible however that she had some prostaglandin release that caused her to have rectal pain, but this should quickly resolve. Pt reassured. Will start Flagyl for BV and await other test results.

## 2013-02-03 ENCOUNTER — Telehealth: Payer: Self-pay | Admitting: Family Medicine

## 2013-02-03 NOTE — Telephone Encounter (Signed)
Left VM for patient stating her tests are negative. Can discuss more at next visit.  Rubee Vega M. Tangala Wiegert, M.D. 02/03/2013 9:46 AM

## 2013-02-22 ENCOUNTER — Ambulatory Visit: Payer: Self-pay | Admitting: Family Medicine

## 2013-02-23 ENCOUNTER — Ambulatory Visit: Payer: 59 | Admitting: Advanced Practice Midwife

## 2013-03-10 ENCOUNTER — Telehealth: Payer: Self-pay | Admitting: Family Medicine

## 2013-03-10 DIAGNOSIS — N83209 Unspecified ovarian cyst, unspecified side: Secondary | ICD-10-CM

## 2013-03-10 MED ORDER — IBUPROFEN 600 MG PO TABS: 600.0000 mg | ORAL_TABLET | Freq: Four times a day (QID) | ORAL | Status: DC | PRN

## 2013-03-10 NOTE — Telephone Encounter (Signed)
Patient would like to receive Ibuprofen 600mg  for the pain in her ovaries. Made an appt with [facility] on 6/30 but states she can not wait that long.

## 2013-03-10 NOTE — Telephone Encounter (Signed)
Ibuprofen was already on med list so I have reordered.   Please call patient and inform her that she has been followed by ob/gyn for her cysts in the past and if she would like she can try to get an appointment there before the 30th if her pain is unbearable or she can be seen sooner by one of my colleagues/fellow residents.

## 2013-03-10 NOTE — Telephone Encounter (Signed)
Will fwd to Md.  Kamayah Pillay L, CMA  

## 2013-03-11 NOTE — Telephone Encounter (Signed)
Pt informed.  See note below.  [name], [name], CMA

## 2013-03-21 ENCOUNTER — Encounter: Payer: Self-pay | Admitting: Family Medicine

## 2013-03-21 ENCOUNTER — Ambulatory Visit (INDEPENDENT_AMBULATORY_CARE_PROVIDER_SITE_OTHER): Payer: Self-pay | Admitting: Family Medicine

## 2013-03-21 VITALS — BP 122/84 | HR 91 | Ht 68.0 in | Wt 214.0 lb

## 2013-03-21 DIAGNOSIS — Z23 Encounter for immunization: Secondary | ICD-10-CM

## 2013-03-21 DIAGNOSIS — R51 Headache: Secondary | ICD-10-CM

## 2013-03-21 DIAGNOSIS — I10 Essential (primary) hypertension: Secondary | ICD-10-CM

## 2013-03-21 DIAGNOSIS — M545 Low back pain: Secondary | ICD-10-CM

## 2013-03-21 MED ORDER — CLONIDINE HCL 0.1 MG PO TABS: 0.1000 mg | ORAL_TABLET | Freq: Two times a day (BID) | ORAL | Status: DC

## 2013-03-21 NOTE — Patient Instructions (Addendum)
1. Sharp pains on left side of head-I believe these are related to stress. You had a normal neurological exam which is reassuring. We can follow up in 4 weeks when we see you back. Keep just taking a break when this happens and try to add some slow deep breathing.   2. Low back pain-I believe you have strained your low back from working so hard. Try to take it easy if possible at work. If you need a note for light duty, just let us know. See me in 4 weeks if not improving with rest, ice, heat, OTC pain medicine and if it worsens before that time, please come back sooner.   Thanks, Dr. Durene Cal

## 2013-03-21 NOTE — Progress Notes (Signed)
  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 States when she gets really stressed she will feel a sharp pain on the left side of her head that will last about a minute. Occasionally she will get a longer lasting frontal headache afterwards. No associated symptoms. Triggers: Agitated or stressed. Better with : staying still. No help: OTC medicine because so short in duration. Recently had a week break from school and pain only happened once when she got stressed at home. When in school, 1-2x a day. No focal extremity weakness, ringing in ears, loss of vision or double vision. No chest pain or palpitations with episodes.   # Low Back pain At patient's job, recently had to start doing a lot more heavy lifting about 3 weeks ago and has noted a moderate intensity throbbing back pain in band across low back not spreading to legs or radiating to other areas.  Walking/sitting all hurt. Pain constant throughout day but worse with bending or lifting heavy. OTC tylenol/ibuprofen and heat/ice all offer some relief.   ROS-History negative for trauma, history of cancer, fever, chills, weight loss, recent bacterial infection, recent IV drug use, HIV, pain worse at night or while supine, saddle anesthesia, bladder issues,  focal neurological deficits including weakness, radiation into legs.    ROS--See HPI  Past Medical History-obesity, hypertension.  Reviewed problem list.  Medications- reviewed and updated Chief complaint-noted  Objective: BP 122/84  Pulse 91  Ht 5\' 8"  (1.727 m)  Wt 214 lb (97.07 kg)  BMI 32.55 kg/m2 Gen: NAD, resting comfortably CV: RRR no murmurs rubs or gallops Lungs: CTAB no crackles, wheeze, rhonchi Neuro: CN II-XII intact, sensation and reflexes normal throughout, 5/5 muscle strength in bilateral upper and lower extremities.  Normal finger to nose. Normal rapid alternating movements.  MSK/Neuro of back:  Back - Normal skin, Spine with normal alignment and no deformity.  No tenderness to vertebral process palpation.  Paraspinous muscles are tender but without spasm.   Range of motion is full at neck and lumbar sacral regions. Straight leg raise negative. No saddle anesthesia. No SI joint tenderness or pain over piriformins. Strength as above. Normal gait.   Assessment/Plan:  Patient previously on buspar for anxiety, could consider restarting in future. Patient tells me she never started singular as well. Also, TDAP today.   # Stabbing pain left side of head/headache Nonfocal neurological exam. Pains are very short and associated with stress. Advised patient to continue her current pattern of just resting when they occur and she can add deep breathing. Can follow up if control not adequate.   # Low Back pain MSK in nature. No red flags. Advised patient to try relative rest with no heavy lifting but continuing to move. FOllow up in 4 weeks if not improved or sooner if pain not controlled with OTC and heat/ice.

## 2013-04-25 ENCOUNTER — Ambulatory Visit: Payer: Self-pay | Admitting: Family Medicine

## 2013-04-27 ENCOUNTER — Telehealth: Payer: Self-pay | Admitting: Family Medicine

## 2013-04-27 DIAGNOSIS — N83209 Unspecified ovarian cyst, unspecified side: Secondary | ICD-10-CM

## 2013-04-27 NOTE — Telephone Encounter (Signed)
Patients calls stating that cyst on ovary is starting to bother her and would like [facility] to prescribed her the ibuprofen.

## 2013-04-28 MED ORDER — IBUPROFEN 600 MG PO TABS: 600.0000 mg | ORAL_TABLET | Freq: Four times a day (QID) | ORAL | Status: DC | PRN

## 2013-04-28 NOTE — Telephone Encounter (Signed)
Refilled x 1 please inform patient please.

## 2013-05-02 ENCOUNTER — Ambulatory Visit: Payer: Self-pay | Admitting: Family Medicine

## 2013-06-29 ENCOUNTER — Ambulatory Visit: Payer: Self-pay | Admitting: Family Medicine

## 2013-06-29 ENCOUNTER — Other Ambulatory Visit (HOSPITAL_COMMUNITY)
Admission: RE | Admit: 2013-06-29 | Discharge: 2013-06-29 | Disposition: A | Discharge status: Home / Self Care | Payer: 59 | Source: Ambulatory Visit | Attending: Family Medicine | Admitting: Family Medicine

## 2013-06-29 ENCOUNTER — Encounter: Payer: Self-pay | Admitting: Family Medicine

## 2013-06-29 ENCOUNTER — Ambulatory Visit (INDEPENDENT_AMBULATORY_CARE_PROVIDER_SITE_OTHER): Payer: 59 | Admitting: Family Medicine

## 2013-06-29 VITALS — BP 140/96 | HR 62 | Temp 98.3°F | Wt 206.0 lb

## 2013-06-29 DIAGNOSIS — N898 Other specified noninflammatory disorders of vagina: Secondary | ICD-10-CM

## 2013-06-29 DIAGNOSIS — Z309 Encounter for contraceptive management, unspecified: Secondary | ICD-10-CM

## 2013-06-29 DIAGNOSIS — Z113 Encounter for screening for infections with a predominantly sexual mode of transmission: Secondary | ICD-10-CM | POA: Insufficient documentation

## 2013-06-29 DIAGNOSIS — Z9189 Other specified personal risk factors, not elsewhere classified: Secondary | ICD-10-CM

## 2013-06-29 DIAGNOSIS — N760 Acute vaginitis: Secondary | ICD-10-CM

## 2013-06-29 DIAGNOSIS — Z202 Contact with and (suspected) exposure to infections with a predominantly sexual mode of transmission: Secondary | ICD-10-CM

## 2013-06-29 LAB — POCT WET PREP (WET MOUNT)

## 2013-06-29 LAB — POCT URINE PREGNANCY: Preg Test, Ur: NEGATIVE

## 2013-06-29 MED ORDER — MEDROXYPROGESTERONE ACETATE 150 MG/ML IM SUSP
150.0000 mg | Freq: Once | INTRAMUSCULAR | Status: AC
  Administered 2013-06-29: 150 mg via INTRAMUSCULAR

## 2013-06-29 MED ORDER — METRONIDAZOLE 500 MG PO TABS: 2000.0000 mg | ORAL_TABLET | Freq: Once | ORAL | Status: DC
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
## 2013-06-29 NOTE — Patient Instructions (Signed)
You and your partner should be treated. No sex until 72 hours after treatment is completed.  Follow up as needed.  Repeat Depo in 3 months.  Robie Oats M. Shaquina Gillham, M.D.

## 2013-06-30 ENCOUNTER — Telehealth: Payer: Self-pay | Admitting: Family Medicine

## 2013-06-30 DIAGNOSIS — A599 Trichomoniasis, unspecified: Secondary | ICD-10-CM | POA: Insufficient documentation

## 2013-06-30 NOTE — Telephone Encounter (Signed)
Pt wanted to clarification.  Her boyfriend told her that his MD told him that since he "had DM and his kidneys did not work right, that's why he kept getting this infection"  Advised pt that this was a SEXUALLY transmitted disease.  Pt is agreeable and grateful. Emersen Mascari, [name]

## 2013-06-30 NOTE — Telephone Encounter (Signed)
Pt has a question about the diagnosis she was given yesterday. Please advise

## 2013-06-30 NOTE — Assessment & Plan Note (Signed)
Wet prep with trich and some clue cells. Will treat with Flagyl 2g x1 for her and once for her partner. No intercourse for 72 hours after treatment. F/u if she fails to improve. Also, restarted on Depo provera today but discussed with her that we should probably stop after the age of 59 since she is perimenopausal currently. She agrees.

## 2013-07-01 ENCOUNTER — Ambulatory Visit: Payer: Self-pay | Admitting: Family Medicine

## 2013-07-01 ENCOUNTER — Other Ambulatory Visit: Payer: Self-pay

## 2013-07-07 ENCOUNTER — Telehealth: Payer: Self-pay | Admitting: Family Medicine

## 2013-07-07 MED ORDER — METRONIDAZOLE 500 MG PO TABS: 500.0000 mg | ORAL_TABLET | Freq: Two times a day (BID) | ORAL | Status: DC

## 2013-07-07 NOTE — Telephone Encounter (Signed)
Blue team please inform patient  Patient with BV and trich at that visit. Treatment would have treated trichomonas if patient has not been re exposed (if partners appropriately treated). I do think a longer treatment for BV is reasonable so I have sent in a 7 day course of flagyl.   No further medications will be filled without an office visit after this point.

## 2013-07-07 NOTE — Telephone Encounter (Signed)
Will forward to MD.  

## 2013-07-07 NOTE — Telephone Encounter (Signed)
Patient is still having a lot of the symptoms as she was on 10/8. She finished the meds, but does not think they were effective. Patient would like to get another rx, she states she is not able to come in again because she has no insurance and cannot not pay for an OV and another RX. Please advise patient.

## 2013-07-08 NOTE — Telephone Encounter (Signed)
Pt is aware and states that if sx do not get better or worsen will make an appt. Yuridiana Formanek,CMA

## 2013-09-30 ENCOUNTER — Other Ambulatory Visit: Payer: Self-pay | Admitting: Family Medicine

## 2013-09-30 MED ORDER — HYDROCHLOROTHIAZIDE 25 MG PO TABS: 25.0000 mg | ORAL_TABLET | Freq: Every day | ORAL | Status: DC

## 2013-12-15 ENCOUNTER — Encounter: Payer: Self-pay | Admitting: Family Medicine

## 2013-12-15 ENCOUNTER — Ambulatory Visit (INDEPENDENT_AMBULATORY_CARE_PROVIDER_SITE_OTHER): Payer: Self-pay | Admitting: Family Medicine

## 2013-12-15 VITALS — BP 111/84 | HR 81 | Temp 99.1°F | Resp 18 | Wt 203.0 lb

## 2013-12-15 DIAGNOSIS — N949 Unspecified condition associated with female genital organs and menstrual cycle: Secondary | ICD-10-CM

## 2013-12-15 DIAGNOSIS — R3989 Other symptoms and signs involving the genitourinary system: Secondary | ICD-10-CM

## 2013-12-15 DIAGNOSIS — R399 Unspecified symptoms and signs involving the genitourinary system: Secondary | ICD-10-CM

## 2013-12-15 DIAGNOSIS — R102 Pelvic and perineal pain: Secondary | ICD-10-CM

## 2013-12-15 LAB — POCT URINALYSIS DIPSTICK
BILIRUBIN UA: NEGATIVE
Glucose, UA: NEGATIVE
Ketones, UA: NEGATIVE
NITRITE UA: NEGATIVE
RBC UA: NEGATIVE
Spec Grav, UA: 1.025
Urobilinogen, UA: 1
pH, UA: 7

## 2013-12-15 LAB — POCT UA - MICROSCOPIC ONLY: Epithelial cells, urine per micros: >20

## 2013-12-15 MED ORDER — METRONIDAZOLE 500 MG PO TABS: 500.0000 mg | ORAL_TABLET | Freq: Two times a day (BID) | ORAL | Status: DC

## 2013-12-15 NOTE — Patient Instructions (Signed)
Please let me know if you need anything or fail to get better.  I will need to send you another letter for you to return to full duty.  Inesha Sow M. Saramarie Stinger, M.D.

## 2013-12-15 NOTE — Progress Notes (Signed)
Patient ID: [name], female   DOB: 1966/02/15, 48 y.o.   MRN: [id number]    Subjective: HPI: Patient is a 48 y.o. female presenting to clinic today for vaginal pain.  Vaginal pain- Patient states she felt like she was coming on her period yesterday, but did not start bleeding. She reports right labial pain, now with back pain also. History of ovarian cysts, but this feels different. No vaginal discharge. No new partners. Normal bowel movements. No burning with urination. Perimenopausal, last period was a few months ago.   History Reviewed: Non smoker. Health Maintenance: UTD  ROS: Please see HPI above.  Objective: Office vital signs reviewed. BP 111/84  Pulse 81  Temp(Src) 99.1 F (37.3 C) (Oral)  Resp 18  Wt 203 lb (92.08 kg)  SpO2 96%  Physical Examination:  General: Awake, alert. NAD HEENT: Atraumatic, normocephalic. MMM Abdomen:+BS, soft, nondistended GU: Mild erythema at right labia minora without skin lesion. Bladder prolapsed into vaginal. Scant white malodorous discharge noted. No CMT. Extremities: No edema Neuro: Grossly intact  Assessment: 48 y.o. female with vaginal pain  Plan: See Problem List and After Visit Summary

## 2013-12-16 ENCOUNTER — Telehealth: Payer: Self-pay | Admitting: Family Medicine

## 2013-12-16 NOTE — Telephone Encounter (Signed)
Please call [name] back regarding pain she's been having today from the problem she was dx'd with at her last visit.  Need advice as to what she can do or take to alleviate.  Having difficulty standing for long period of time due to pain.

## 2013-12-16 NOTE — Telephone Encounter (Signed)
Will forward to Dr. Sheral Apley who saw her.  Weston Fulco,CMA

## 2013-12-16 NOTE — Telephone Encounter (Signed)
She can take 2 Aleve BID for pain.  Try to rest this weekend, she can use heating pad on her back if it is still bothering her.  I do not think this is related to her diagnosis from yesterday.  Follow up if she is not better by Monday.  Atzin Buchta M. Janele Lague, M.D.

## 2013-12-16 NOTE — Telephone Encounter (Signed)
LM for patient with message from MD.  Rupal Childress,CMA ° °

## 2013-12-18 DIAGNOSIS — R102 Pelvic and perineal pain: Secondary | ICD-10-CM | POA: Insufficient documentation

## 2013-12-18 NOTE — Assessment & Plan Note (Signed)
Small area of erythema could be causing some of her discomfort (appears to be a scratch or abrasion, no lesion.) Bladder is prolapsed also which could cause some discomfort. + BV, which can make her feel uncomfortable. She could be starting her period soon.  P: - Treat BV with flagyl - Lubrication for any sexual contact - Further work up may include pelvic ultrasound to look at endometrial stripe and ovaries - Consider referral for bladder prolapse. - F/u prn

## 2013-12-24 ENCOUNTER — Encounter (HOSPITAL_COMMUNITY): Payer: Self-pay | Admitting: Emergency Medicine

## 2013-12-24 ENCOUNTER — Emergency Department (HOSPITAL_COMMUNITY): Payer: Self-pay

## 2013-12-24 ENCOUNTER — Emergency Department (HOSPITAL_COMMUNITY)
Admission: EM | Admit: 2013-12-24 | Discharge: 2013-12-24 | Disposition: A | Discharge status: Home / Self Care | Payer: Self-pay | Attending: Emergency Medicine | Admitting: Emergency Medicine

## 2013-12-24 DIAGNOSIS — F329 Major depressive disorder, single episode, unspecified: Secondary | ICD-10-CM | POA: Insufficient documentation

## 2013-12-24 DIAGNOSIS — Z862 Personal history of diseases of the blood and blood-forming organs and certain disorders involving the immune mechanism: Secondary | ICD-10-CM | POA: Insufficient documentation

## 2013-12-24 DIAGNOSIS — I10 Essential (primary) hypertension: Secondary | ICD-10-CM | POA: Insufficient documentation

## 2013-12-24 DIAGNOSIS — Z792 Long term (current) use of antibiotics: Secondary | ICD-10-CM | POA: Insufficient documentation

## 2013-12-24 DIAGNOSIS — W2209XA Striking against other stationary object, initial encounter: Secondary | ICD-10-CM | POA: Insufficient documentation

## 2013-12-24 DIAGNOSIS — S92919A Unspecified fracture of unspecified toe(s), initial encounter for closed fracture: Secondary | ICD-10-CM | POA: Insufficient documentation

## 2013-12-24 DIAGNOSIS — IMO0002 Reserved for concepts with insufficient information to code with codable children: Secondary | ICD-10-CM | POA: Insufficient documentation

## 2013-12-24 DIAGNOSIS — Y92009 Unspecified place in unspecified non-institutional (private) residence as the place of occurrence of the external cause: Secondary | ICD-10-CM | POA: Insufficient documentation

## 2013-12-24 DIAGNOSIS — Z87448 Personal history of other diseases of urinary system: Secondary | ICD-10-CM | POA: Insufficient documentation

## 2013-12-24 DIAGNOSIS — I209 Angina pectoris, unspecified: Secondary | ICD-10-CM | POA: Insufficient documentation

## 2013-12-24 DIAGNOSIS — F3289 Other specified depressive episodes: Secondary | ICD-10-CM | POA: Insufficient documentation

## 2013-12-24 DIAGNOSIS — F411 Generalized anxiety disorder: Secondary | ICD-10-CM | POA: Insufficient documentation

## 2013-12-24 DIAGNOSIS — Y9389 Activity, other specified: Secondary | ICD-10-CM | POA: Insufficient documentation

## 2013-12-24 DIAGNOSIS — Z79899 Other long term (current) drug therapy: Secondary | ICD-10-CM | POA: Insufficient documentation

## 2013-12-24 DIAGNOSIS — S92402A Displaced unspecified fracture of left great toe, initial encounter for closed fracture: Secondary | ICD-10-CM

## 2013-12-24 DIAGNOSIS — Z8709 Personal history of other diseases of the respiratory system: Secondary | ICD-10-CM | POA: Insufficient documentation

## 2013-12-24 DIAGNOSIS — Z91040 Latex allergy status: Secondary | ICD-10-CM | POA: Insufficient documentation

## 2013-12-24 HISTORY — DX: Cystocele, unspecified: N81.10

## 2013-12-24 MED ORDER — OXYCODONE-ACETAMINOPHEN 5-325 MG PO TABS: 1.0000 | ORAL_TABLET | ORAL | Status: DC | PRN

## 2013-12-24 MED ORDER — OXYCODONE-ACETAMINOPHEN 5-325 MG PO TABS
1.0000 | ORAL_TABLET | Freq: Once | ORAL | Status: AC
  Administered 2013-12-24: 1 via ORAL
  Filled 2013-12-24: qty 1

## 2013-12-24 NOTE — Discharge Instructions (Signed)
Take the prescribed medication as directed.  Ice and elevate toe at home to help with pain and swelling. Follow-up with orthopedics-- call and schedule appt. Return to the ED for new or worsening symptoms.
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
## 2013-12-24 NOTE — ED Notes (Signed)
Pt c/o L great toe pain, pt states she struck toe on step last night while walking into her apartment

## 2013-12-24 NOTE — ED Provider Notes (Signed)
Medical screening examination/treatment/procedure(s) were performed by non-physician practitioner and as supervising physician I was immediately available for consultation/collaboration.   EKG Interpretation None        Shakendra Griffeth, MD 12/24/13 1542 

## 2013-12-26 ENCOUNTER — Encounter: Payer: Self-pay | Admitting: Family Medicine

## 2013-12-26 ENCOUNTER — Ambulatory Visit (INDEPENDENT_AMBULATORY_CARE_PROVIDER_SITE_OTHER): Payer: Self-pay | Admitting: Family Medicine

## 2013-12-26 VITALS — BP 107/74 | HR 76 | Temp 98.1°F | Wt 202.0 lb

## 2013-12-26 DIAGNOSIS — N949 Unspecified condition associated with female genital organs and menstrual cycle: Secondary | ICD-10-CM

## 2013-12-26 DIAGNOSIS — R102 Pelvic and perineal pain: Secondary | ICD-10-CM

## 2013-12-26 NOTE — Patient Instructions (Signed)

## 2013-12-29 NOTE — Progress Notes (Signed)
Patient ID: [name], female   DOB: Oct 06, 1965, 48 y.o.   MRN: [id number]  [name], MD Phone: [phone]  [name] is a 48 y.o. female who presents today for f/u.  Vaginal discomfort: notes see previously by Dr Sheral Apley and noted to have bladder prolapse. Since that time has felt like her bladder has dropped down more and feels as though something is coming out. There is pelvic floor pressure, no pain. Also rectal pressure. She has noted some leakage of urine. Notes crampy pain with BM that resolves after BM. Denies vaginal bleeding, discharge, and dysuria. Note had BV previously and was treated for this.   Patient is a nonsmoker.  ROS: Per HPI   Physical Exam Filed Vitals:   12/26/13 1524  BP: 107/74  Pulse: 76  Temp: 98.1 F (36.7 C)    Physical Examination: General appearance - alert, well appearing, and in no distress GU Female - labia and vaginal introitus normal appearing, vaginal mucosa normal appearing, minimal mucus discharge present, cervix visualized and normal in appearance, bimanual exam with evidence of mild appearing bladder prolapse, no masses, no cervical motion tenderness  Assessment/Plan: Please see individual problem list.

## 2013-12-29 NOTE — Assessment & Plan Note (Addendum)
Note patient with continued pressure and sensation of bladder prolapse. Has prolapse on exam. Discussed use of kegel exercises to help strengthen the vaginal floor muscles. She is to do these daily and f/u with her PCP in one month for this issue. Given return precautions.

## 2014-01-03 ENCOUNTER — Telehealth: Payer: Self-pay | Admitting: Family Medicine

## 2014-01-03 NOTE — Telephone Encounter (Signed)
Pt called and would like a letter stating that for the next three days she can be excused from work and school. She has a broken toe and it is not getting better because of her full schedule of work and school. Please call when ready to pick up. [name]

## 2014-01-03 NOTE — Telephone Encounter (Signed)
We have not seen her for the broken toe. She was only evaluated in the ED. It says that she was supposed to follow up with orthopedics. As they are the ones evaluating her, I think it would be most ideal for them to write the letter for her.   If she is not seeing orthopedics, it would be best if she follows up with [place] for this so we can evaluate in office and determine long term therapy and necessity of being out of work. I could back date the letter if she sees me perhaps next Monday or Wednesday.   Thanks, Dr. Yong Channel  P.S. If patient gives is not agreeable to above options can give a letter with the following:  Ms. Justman was evaluated by the emergency department of [facility] on 12/24/13 and found to have a left big toe fracture. She was advised to follow up with orthopedics for a recheck. Patient reports by phone to [place] that she is unable to tolerate the pain she is experiencing from work and school. I would ask that she pleased be excused through  01/09/14 as I have asked her to follow up with our office (primary care) for further evaluation as we are unclear if she has seen orthopedics at this time.

## 2014-01-04 ENCOUNTER — Ambulatory Visit: Payer: Self-pay | Admitting: Family Medicine

## 2014-01-05 NOTE — Telephone Encounter (Signed)
LM for pt to call back.  Please inform her of letter options or that she can come in and have her toe evaluated.  She was scheduled for an appt yesterday and didn't show up.  Thanks [employer]

## 2014-01-09 ENCOUNTER — Ambulatory Visit: Payer: Self-pay | Admitting: Family Medicine

## 2014-01-11 ENCOUNTER — Ambulatory Visit: Payer: Self-pay | Admitting: Family Medicine

## 2014-01-22 ENCOUNTER — Emergency Department (HOSPITAL_COMMUNITY)
Admission: EM | Admit: 2014-01-22 | Discharge: 2014-01-22 | Disposition: A | Discharge status: Home / Self Care | Payer: Self-pay | Attending: Emergency Medicine | Admitting: Emergency Medicine

## 2014-01-22 ENCOUNTER — Emergency Department (HOSPITAL_COMMUNITY): Payer: Self-pay

## 2014-01-22 ENCOUNTER — Encounter (HOSPITAL_COMMUNITY): Payer: Self-pay | Admitting: Emergency Medicine

## 2014-01-22 DIAGNOSIS — Z87828 Personal history of other (healed) physical injury and trauma: Secondary | ICD-10-CM | POA: Insufficient documentation

## 2014-01-22 DIAGNOSIS — G8911 Acute pain due to trauma: Secondary | ICD-10-CM | POA: Insufficient documentation

## 2014-01-22 DIAGNOSIS — Z8742 Personal history of other diseases of the female genital tract: Secondary | ICD-10-CM | POA: Insufficient documentation

## 2014-01-22 DIAGNOSIS — M25579 Pain in unspecified ankle and joints of unspecified foot: Secondary | ICD-10-CM | POA: Insufficient documentation

## 2014-01-22 DIAGNOSIS — I209 Angina pectoris, unspecified: Secondary | ICD-10-CM | POA: Insufficient documentation

## 2014-01-22 DIAGNOSIS — F411 Generalized anxiety disorder: Secondary | ICD-10-CM | POA: Insufficient documentation

## 2014-01-22 DIAGNOSIS — Z91040 Latex allergy status: Secondary | ICD-10-CM | POA: Insufficient documentation

## 2014-01-22 DIAGNOSIS — Z79899 Other long term (current) drug therapy: Secondary | ICD-10-CM | POA: Insufficient documentation

## 2014-01-22 DIAGNOSIS — Z862 Personal history of diseases of the blood and blood-forming organs and certain disorders involving the immune mechanism: Secondary | ICD-10-CM | POA: Insufficient documentation

## 2014-01-22 DIAGNOSIS — S92402D Displaced unspecified fracture of left great toe, subsequent encounter for fracture with routine healing: Secondary | ICD-10-CM

## 2014-01-22 DIAGNOSIS — I10 Essential (primary) hypertension: Secondary | ICD-10-CM | POA: Insufficient documentation

## 2014-01-22 MED ORDER — OXYCODONE-ACETAMINOPHEN 5-325 MG PO TABS: 1.0000 | ORAL_TABLET | Freq: Three times a day (TID) | ORAL | Status: DC | PRN
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
## 2014-01-22 NOTE — Discharge Instructions (Signed)
Please call your doctor for a followup appointment within 24-48 hours. When you talk to your doctor please let them know that you were seen in the emergency department and have them acquire all of your records so that they can discuss the findings with you and formulate a treatment plan to fully care for your new and ongoing problems. Please call and set up an appointment to permit care provider to be reassessed within the next 2448 hours Please take appointment with orthopedics this week with Dr. Rolena Infante Please rest and stay hydrated Please avoid any physical or strenuous activity Please keep foot in boot Please take pain medications as prescribed - while on pain medications there is to be no drinking alcohol, driving, operating any heavy machinery. If there is extra please dispose in a proper manner. Please do not take any extra Tylenol for this can lead to tylenol over and liver issues which are fatal Please continue to monitor symptoms and if symptoms are to worsen or change (fever greater than 101, chills, sweating, nausea, vomiting, diarrhea, chest pain, shortness of breath, difficulty breathing, numbness, tingling, fall, injury, loss of sensation to the toe, worsening or changes to toe pain) please report back to the ED immediately  Toe Fracture Your caregiver has diagnosed you as having a fractured toe. A toe fracture is a break in the bone of a toe. "Buddy taping" is a way of splinting your broken toe, by taping the broken toe to the toe next to it. This "buddy taping" will keep the injured toe from moving beyond normal range of motion. Buddy taping also helps the toe heal in a more normal alignment. It may take 6 to 8 weeks for the toe injury to heal. Fisk your toes taped together for as long as directed by your caregiver or until you see a doctor for a follow-up examination. You can change the tape after bathing. Always use a small piece of gauze or cotton between the  toes when taping them together. This will help the skin stay dry and prevent infection.  Apply ice to the injury for 15-20 minutes each hour while awake for the first 2 days. Put the ice in a plastic bag and place a towel between the bag of ice and your skin.  After the first 2 days, apply heat to the injured area. Use heat for the next 2 to 3 days. Place a heating pad on the foot or soak the foot in warm water as directed by your caregiver.  Keep your foot elevated as much as possible to lessen swelling.  Wear sturdy, supportive shoes. The shoes should not pinch the toes or fit tightly against the toes.  Your caregiver may prescribe a rigid shoe if your foot is very swollen.  Your may be given crutches if the pain is too great and it hurts too much to walk.  Only take over-the-counter or prescription medicines for pain, discomfort, or fever as directed by your caregiver.  If your caregiver has given you a follow-up appointment, it is very important to keep that appointment. Not keeping the appointment could result in a chronic or permanent injury, pain, and disability. If there is any problem keeping the appointment, you must call back to this facility for assistance. SEEK MEDICAL CARE IF:   You have increased pain or swelling, not relieved with medications.  The pain does not get better after 1 week.  Your injured toe is cold when the  others are warm. SEEK IMMEDIATE MEDICAL CARE IF:   The toe becomes cold, numb, or white.  The toe becomes hot (inflamed) and red. Document Released: 09/05/2000 Document Revised: 12/01/2011 Document Reviewed: 04/24/2008 [facility] Patient Information 2014 [place].
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
## 2014-01-23 ENCOUNTER — Encounter: Payer: Self-pay | Admitting: Family Medicine

## 2014-01-23 ENCOUNTER — Encounter: Payer: Self-pay | Admitting: *Deleted

## 2014-01-23 ENCOUNTER — Ambulatory Visit (INDEPENDENT_AMBULATORY_CARE_PROVIDER_SITE_OTHER): Payer: Self-pay | Admitting: Family Medicine

## 2014-01-23 VITALS — BP 143/94 | HR 73 | Temp 98.2°F | Wt 203.6 lb

## 2014-01-23 DIAGNOSIS — S92919A Unspecified fracture of unspecified toe(s), initial encounter for closed fracture: Secondary | ICD-10-CM

## 2014-01-23 DIAGNOSIS — R102 Pelvic and perineal pain: Secondary | ICD-10-CM

## 2014-01-23 DIAGNOSIS — N949 Unspecified condition associated with female genital organs and menstrual cycle: Secondary | ICD-10-CM

## 2014-01-23 DIAGNOSIS — S92402A Displaced unspecified fracture of left great toe, initial encounter for closed fracture: Secondary | ICD-10-CM

## 2014-01-23 NOTE — Progress Notes (Signed)
  Garret Reddish, MD Phone: [phone]  Subjective:   [name] is a 48 y.o. year old very pleasant female patient who presents with the following:  Left Great Toe fracture Continues to have pain and swelling when on feet for prolonged period. Non-compliant at times with recommendations from orthopedics and ED about remaining off foot. Went to ED last night due to pain. Follow up x-ray shows healing. Mainly uses ibuprofen for pain but has percocet available from ED on prn basis.  ROS- no fever/chills/redness.   Bladder Prolapse Seen x2 for vaginal pressure over last month. Was told had bladder prolapse. Does about 20 kegels each night. Vaginal pressure and pain have resolved with this. Also was treated for BV which may have improved pain.  ROS- no polyuria/dysuria.   Past Medical History- obesity, hypertension, vaginal pain, seasonal allergies  Medications- reviewed and updated Current Outpatient Prescriptions  Medication Sig Dispense Refill  . cloNIDine (CATAPRES) 0.1 MG tablet Take 0.1 mg by mouth 2 (two) times daily.      . hydrochlorothiazide (HYDRODIURIL) 25 MG tablet Take 25 mg by mouth every morning.       Marland Kitchen ibuprofen (ADVIL,MOTRIN) 200 MG tablet Take 800 mg by mouth every 6 (six) hours as needed for mild pain.      Marland Kitchen oxyCODONE-acetaminophen (PERCOCET/ROXICET) 5-325 MG per tablet Take 1 tablet by mouth every 8 (eight) hours as needed for severe pain.  11 tablet  0   No current facility-administered medications for this visit.    Objective: BP 143/94  Pulse 73  Temp(Src) 98.2 F (36.8 C) (Oral)  Wt 203 lb 9.6 oz (92.352 kg)  LMP 10/06/2013 Regarding BP, states in pain today  Gen: NAD, resting comfortably Ext: no obvious edema, swelling of left great toe Pelvic: cervix normal in appearance, external genitalia normal, no adnexal masses or tenderness, no cervical motion tenderness, positive findings: small cystocele without protrusion, uterus normal size, shape,  and consistency and vagina normal without discharge  Assessment/Plan:  Vaginal pain Bladder and vaginal pressure have resolved. Prolapse is noted but is very small. Kegel exercises likely helping-advised to continue. Advised light duty at work to allow to continue to heal.   Left Great Toe fracture Advised to avoid overuse and continue use of boot. Has ortho follow up later this week. Wrote work note for 1 month for light duty.

## 2014-01-23 NOTE — Patient Instructions (Signed)
Fracture  Make sure to follow up with orthopedics  Stay off the foot as they have advised  Bladder Prolapse  Has improved  Keep doing Kegel exercises and work up to 40-50 per day  Thanks, Dr. Yong Channel  Kegel Exercises The goal of Kegel exercises is to isolate and exercise your pelvic floor muscles. These muscles act as a hammock that supports the rectum, vagina, small intestine, and uterus. As the muscles weaken, the hammock sags and these organs are displaced from their normal positions. Kegel exercises can strengthen your pelvic floor muscles and help you to improve bladder and bowel control, improve sexual response, and help reduce many problems and some discomfort during pregnancy. Kegel exercises can be done anywhere and at any time. HOW TO PERFORM KEGEL EXERCISES 1. Locate your pelvic floor muscles. To do this, squeeze (contract) the muscles that you use when you try to stop the flow of urine. You will feel a tightness in the vaginal area (women) and a tight lift in the rectal area (men and women). 2. When you begin, contract your pelvic muscles tight for 2 5 seconds, then relax them for 2 5 seconds. This is one set. Do 4 5 sets with a short pause in between. 3. Contract your pelvic muscles for 8 10 seconds, then relax them for 8 10 seconds. Do 4 5 sets. If you cannot contract your pelvic muscles for 8 10 seconds, try 5 7 seconds and work your way up to 8 10 seconds. Your goal is 4 5 sets of 10 contractions each day. Keep your stomach, buttocks, and legs relaxed during the exercises. Perform sets of both short and long contractions. Vary your positions. Perform these contractions 3 4 times per day. Perform sets while you are:   Lying in bed in the morning.  Standing at lunch.  Sitting in the late afternoon.  Lying in bed at night. You should do 40 50 contractions per day. Do not perform more Kegel exercises per day than recommended. Overexercising can cause muscle fatigue. Continue  these exercises for for at least 15 20 weeks or as directed by your caregiver. Document Released: 08/25/2012 Document Reviewed: 08/25/2012 [facility] Patient Information 2014 [place], [place].

## 2014-01-23 NOTE — Assessment & Plan Note (Signed)
Bladder and vaginal pressure have resolved. Prolapse is noted but is very small. Kegel exercises likely helping-advised to continue. Advised light duty at work to allow to continue to heal.

## 2014-01-24 NOTE — ED Provider Notes (Signed)
Medical screening examination/treatment/procedure(s) were performed by non-physician practitioner and as supervising physician I was immediately available for consultation/collaboration.   EKG Interpretation None        Mirna Mires, MD 01/24/14 1430

## 2014-02-17 ENCOUNTER — Ambulatory Visit: Payer: Self-pay | Admitting: Family Medicine

## 2014-03-02 ENCOUNTER — Encounter: Payer: Self-pay | Admitting: Family Medicine

## 2014-03-02 ENCOUNTER — Ambulatory Visit (INDEPENDENT_AMBULATORY_CARE_PROVIDER_SITE_OTHER): Payer: Self-pay | Admitting: Family Medicine

## 2014-03-02 VITALS — BP 107/74 | HR 76 | Temp 98.3°F | Ht 68.0 in | Wt 206.0 lb

## 2014-03-02 DIAGNOSIS — N83209 Unspecified ovarian cyst, unspecified side: Secondary | ICD-10-CM

## 2014-03-02 DIAGNOSIS — R109 Unspecified abdominal pain: Secondary | ICD-10-CM

## 2014-03-02 NOTE — Patient Instructions (Signed)
I am not sure the cause of your pain but I agree following up your cysts is reasonable.   For your labs and testing, I will send you a letter if there are no medication changes needed. I will call you if we need to discuss your lab results.  Orders Placed This Encounter  Procedures  . US Transvaginal Non-OB  . HIV antibody  . RPR  . POCT urine pregnancy  Gonorrhea/chlamydia testing as well   Thanks, Dr. Retail banker
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
## 2014-03-02 NOTE — Assessment & Plan Note (Signed)
Given some RLQ abdominal pain and history of STD as well as ovarian cyst that had recommended follow up but benign abdominal exam-ordered upreg, gc/chlamydia/rpr/hiv as well as transvaginal ultrasound. At end of the visit, noted patient does not have insurance. Discussed with patient especially given pain is improving and she has opted to forego the planned evaluation for now to avoid cost of these investigations. She is supposed to get insurance in November and will come in for follow up at that time and to repeat the above tests unless pain worsens or does not continue to improve.

## 2014-04-07 ENCOUNTER — Encounter: Payer: Self-pay | Admitting: Family Medicine

## 2014-04-07 ENCOUNTER — Ambulatory Visit (INDEPENDENT_AMBULATORY_CARE_PROVIDER_SITE_OTHER): Payer: Self-pay | Admitting: Family Medicine

## 2014-04-07 VITALS — BP 134/88 | HR 76 | Temp 98.0°F | Resp 16 | Wt 205.0 lb

## 2014-04-07 DIAGNOSIS — M546 Pain in thoracic spine: Secondary | ICD-10-CM

## 2014-04-07 DIAGNOSIS — M545 Low back pain, unspecified: Secondary | ICD-10-CM | POA: Insufficient documentation

## 2014-04-07 HISTORY — DX: Low back pain, unspecified: M54.50

## 2014-04-07 MED ORDER — BACLOFEN 10 MG PO TABS: 10.0000 mg | ORAL_TABLET | Freq: Three times a day (TID) | ORAL | Status: DC

## 2014-04-07 NOTE — Patient Instructions (Signed)
Low Back Strain with Rehab   A strain is an injury in which a tendon or muscle is torn. The muscles and tendons of the lower back are vulnerable to strains. However, these muscles and tendons are very strong and require a great force to be injured.    SYMPTOMS  Pain in the lower back.  Pain that affects one side more than the other.  Pain that gets worse with movement and may be felt in the hip, buttocks, or back of the thigh.  Muscle spasms of the muscles in the back.  Swelling along the muscles of the back.  Loss of strength of the back muscles.  Crackling sound (crepitation) when the muscles are touched.   CAUSES   Lower back strains occur when a force is placed on the muscles or tendons that is greater than they can handle. Common causes of injury include:  Prolonged overuse of the muscle-tendon units in the lower back, usually from incorrect posture.  A single violent injury or force applied to the back.   RISK INCREASES WITH    Sports that involve twisting forces on the spine or a lot of bending at the waist (football, rugby, weightlifting, bowling, golf, tennis, speed skating, racquetball, swimming, running, gymnastics, diving).  Poor strength and flexibility.  Failure to warm up properly before activity.  Family history of lower back pain or disk disorders.  Previous back injury or surgery (especially fusion).  Poor posture with lifting, especially heavy objects.  Prolonged sitting, especially with poor posture.   PREVENTIVE MEASURES    Learn and use proper posture when sitting or lifting (maintain proper posture when sitting, lift using the knees and legs, not at the waist).  Warm up and stretch properly before activity.  Allow for adequate recovery between workouts.  Maintain physical fitness: l Strength, flexibility, and endurance. l Cardiovascular fitness.   PROGNOSIS If treated properly, lower back strains usually heal within 6 weeks.   POSSIBLE  COMPLICATIONS    Recurring symptoms, resulting in a chronic problem.  Chronic inflammation, scarring, and partial muscle-tendon tear.  Delayed healing or resolution of symptoms.   GENERAL TREATMENT CONSIDERATIONS   Treatment first involves the use of ice and medicine, to reduce pain and inflammation. The use of strengthening and stretching exercises will help reduce pain with activity. These exercises may be performed at home or with a therapist. Severe injuries may require referral to a therapist for further evaluation and treatment, such as ultrasound. Your caregiver may advise that you wear a back brace or corset, to help reduce pain and discomfort. Often, prolonged bed rest results in greater harm then benefit. Corticosteroid injections may be recommended. However, these should be reserved for the most serious cases. It is important to avoid using your back when lifting objects. At night, sleep on your back on a firm mattress with a pillow placed under your knees. If non-surgical treatment is unsuccessful, surgery may be needed.     MEDICATION:    If pain medicine is needed, nonsteroidal anti-inflammatory medicines (aspirin and ibuprofen), or other minor pain relievers (acetaminophen), are often advised.    Do not take pain medicine for 7 days before surgery.    Prescription pain relievers may be given, if your caregiver thinks they are needed. Use only as directed and only as much as you need.  Ointments applied to the skin may be helpful.  Corticosteroid injections may be given by your caregiver. These injections should be reserved for the most serious  cases, because they may only be given a certain number of times.   HEAT AND COLD:    Cold treatment (icing) should be applied for 10 to 15 minutes every 2 to 3 hours for inflammation and pain, and immediately after activity that aggravates your symptoms. Use ice packs or an ice massage.  Heat treatment may be used before performing  stretching and strengthening activities prescribed by your caregiver, physical therapist, or athletic trainer. Use a heat pack or a warm water soak.     SEEK MEDICAL CARE IF:    Symptoms get worse or do not improve in 2 to 4 weeks, despite treatment.  You develop numbness, weakness, or loss of bowel or bladder function.  New, unexplained symptoms develop. (Drugs used in treatment may produce side effects.)     EXERCISES   RANGE OF MOTION AND STRETCHING EXERCISES - Low Back Strain Most people with lower back pain will find that their symptoms get worse with excessive bending forward (flexion) or arching at the lower back (extension). The exercises which will help resolve your symptoms will focus on the opposite motion.     Your physician, physical therapist or athletic trainer will help you determine which exercises will be most helpful to resolve your lower back pain. Do not complete any exercises without first consulting with your caregiver. Discontinue any exercises which make your symptoms worse until you speak to your caregiver.    If you have pain, numbness or tingling which travels down into your buttocks, leg or foot, the goal of the therapy is for these symptoms to move closer to your back and eventually resolve. Sometimes, these leg symptoms will get better, but your lower back pain may worsen. This is typically an indication of progress in your rehabilitation. Be very alert to any changes in your symptoms and the activities in which you participated in the 24 hours prior to the change. Sharing this information with your caregiver will allow him/her to most efficiently treat your condition.    These exercises may help you when beginning to rehabilitate your injury. Your symptoms may resolve with or without further involvement from your physician, physical therapist or athletic trainer. While completing these exercises, remember:    Restoring tissue flexibility helps normal motion to  return to the joints. This allows healthier, less painful movement and activity.  An effective stretch should be held for at least 30 seconds.  A stretch should never be painful. You should only feel a gentle lengthening or release in the stretched tissue.       FLEXION RANGE OF MOTION AND STRETCHING EXERCISES:   STRETCH - Flexion, Single Knee to Chest   Lie on a firm bed or floor with both legs extended in front of you.  Keeping one leg in contact with the floor, bring your opposite knee to your chest. Hold your leg in place by either grabbing behind your thigh or at your knee.  Pull until you feel a gentle stretch in your lower back. Hold ___15___ seconds.  Slowly release your grasp and repeat the exercise with the opposite side. Repeat ___3____ times. Complete this exercise _____1_____ times per day.      STRETCH - Flexion, Double Knee to Chest   Lie on a firm bed or floor with both legs extended in front of you.  Keeping one leg in contact with the floor, bring your opposite knee to your chest.    Tense your stomach muscles to support your back and  then lift your other knee to your chest. Hold your legs in place by either grabbing behind your thighs or at your knees.  Pull both knees toward your chest until you feel a gentle stretch in your lower back. Hold ___15___ seconds.  Tense your stomach muscles and slowly return one leg at a time to the floor. Repeat ____3_ times. Complete this exercise _1____ times per day.      STRETCH - Low Trunk Rotation    Lie on a firm bed or floor. Keeping your legs in front of you, bend your knees so they are both pointed toward the ceiling and your feet are flat on the floor.  Extend your arms out to the side. This will stabilize your upper body by keeping your shoulders in contact with the floor.  Gently and slowly drop both knees together to one side until you feel a gentle stretch in your lower back. Hold for __15_ seconds.    Tense  your stomach muscles to support your lower back as you bring your knees back to the starting position. Repeat the exercise to the other side. Repeat __3__ times. Complete this exercise ___1__ times per day       EXTENSION RANGE OF MOTION AND FLEXIBILITY EXERCISES:  STRETCH - Extension, Prone on Elbows   Lie on your stomach on the floor, a bed will be too soft. Place your palms about shoulder width apart and at the height of your head.  Place your elbows under your shoulders. If this is too painful, stack pillows under your chest.  Allow your body to relax so that your hips drop lower and make contact more completely with the floor.  Hold this position for _15___ seconds.  Slowly return to lying flat on the floor. Repeat __3___ times. Complete this exercise ___1___ times per day.      RANGE OF MOTION - Extension, Prone Press Ups    Lie on your stomach on the floor, a bed will be too soft. Place your palms about shoulder width apart and at the height of your head.  Keeping your back as relaxed as possible, slowly straighten your elbows while keeping your hips on the floor. You may adjust the placement of your hands to maximize your comfort. As you gain motion, your hands will come more underneath your shoulders.  Hold this position ___15__ seconds.  Slowly return to lying flat on the floor. Repeat __3____ times. Complete this exercise __1____ times per day.       RANGE OF MOTION- Quadruped, Neutral Spine   Assume a hands and knees position on a firm surface. Keep your hands under your shoulders and your knees under your hips. You may place padding under your knees for comfort.    Drop your head and point your tail bone toward the ground below you.  This will round out your lower back like an angry cat. Hold this position for __15____ seconds.    Slowly lift your head and release your tail bone so that your back sags into a large arch, like an old horse.  Hold this position for  __15____ seconds.    Repeat this until you feel limber in your lower back.  Now, find your "sweet spot." This will be the most comfortable position somewhere between the two previous positions. This is your neutral spine. Once you have found this position, tense your stomach muscles to support your lower back.  Hold this position for __15____ seconds. Repeat _3____ times.  Complete this exercise __1____ times per day.       STRENGTHENING EXERCISES - Low Back Strain These exercises may help you when beginning to rehabilitate your injury. These exercises should be done near your "sweet spot." This is the neutral, low-back arch, somewhere between fully rounded and fully arched, that is your least painful position. When performed in this safe range of motion, these exercises can be used for people who have either a flexion or extension based injury. These exercises may resolve your symptoms with or without further involvement from your physician, physical therapist or athletic trainer. While completing these exercises, remember:    Muscles can gain both the endurance and the strength needed for everyday activities through controlled exercises.  Complete these exercises as instructed by your physician, physical therapist or athletic trainer. Increase the resistance and repetitions only as guided.  You may experience muscle soreness or fatigue, but the pain or discomfort you are trying to eliminate should never worsen during these exercises. If this pain does worsen, stop and make certain you are following the directions exactly. If the pain is still present after adjustments, discontinue the exercise until you can discuss the trouble with your caregiver.     STRENGTHENING - Deep Abdominals, Pelvic Tilt    Lie on a firm bed or floor. Keeping your legs in front of you, bend your knees so they are both pointed toward the ceiling and your feet are flat on the floor.  Tense your lower abdominal muscles  to press your lower back into the floor. This motion will rotate your pelvis so that your tail bone is scooping upwards rather than pointing at your feet or into the floor.  With a gentle tension and even breathing, hold this position for ____10__ seconds. Repeat __10____ times. Complete this exercise ____1___ times per day.      STRENGTHENING - Abdominals, Crunches   Lie on a firm bed or floor. Keeping your legs in front of you, bend your knees so they are both pointed toward the ceiling and your feet are flat on the floor. Cross your arms over your chest.    Slightly tip your chin down without bending your neck.  Tense your abdominals and slowly lift your trunk high enough to just clear your shoulder blades. Lifting higher can put excessive stress on the lower back and does not further strengthen your abdominal muscles.  Control your return to the starting position. Repeat __10___ times. Complete this exercise ____1___ times per day.    STRENGTHENING - Quadruped, Opposite UE/LE Lift   Assume a hands and knees position on a firm surface. Keep your hands under your shoulders and your knees under your hips. You may place padding under your knees for comfort.    Find your neutral spine and gently tense your abdominal muscles so that you can maintain this position. Your shoulders and hips should form a rectangle that is parallel with the floor and is not twisted.    Keeping your trunk steady, lift your right hand no higher than your shoulder and then your left leg no higher than your hip. Make sure you are not holding your breath. Hold this position ___10___ seconds.  Continuing to keep your abdominal muscles tense and your back steady, slowly return to your starting position. Repeat with the opposite arm and leg. Repeat _10____ times. Complete this exercise __1___ times per day.      STRENGTHENING - Lower Abdominals, Double Knee Lift  Lie on a  firm bed or floor. Keeping your legs in  front of you, bend your knees so they are both pointed toward the ceiling and your feet are flat on the floor.    Tense your abdominal muscles to brace your lower back and slowly lift both of your knees until they come over your hips. Be certain not to hold your breath.    Hold _10__ seconds. Using your abdominal muscles, return to the starting position in a slow and controlled manner.   Repeat __1____ times. Complete this exercise __1___ times per day.       POSTURE AND BODY MECHANICS CONSIDERATIONS - Low Back Strain Keeping correct posture when sitting, standing or completing your activities will reduce the stress put on different body tissues, allowing injured tissues a chance to heal and limiting painful experiences. The following are general guidelines for improved posture. Your physician or physical therapist will provide you with any instructions specific to your needs. While reading these guidelines, remember:  The exercises prescribed by your provider will help you have the flexibility and strength to maintain correct postures.  The correct posture provides the best environment for your joints to work. All of your joints have less wear and tear when properly supported by a spine with good posture. This means you will experience a healthier, less painful body.  Correct posture must be practiced with all of your activities, especially prolonged sitting and standing.  Correct posture is as important when doing repetitive low-stress activities (typing) as it is when doing a single heavy-load activity (lifting).       RESTING POSITIONS Consider which positions are most painful for you when choosing a resting position.  If you have pain with flexion-based activities (sitting, bending, stooping, squatting), choose a position that allows you to rest in a less flexed posture. You would want to avoid curling into a fetal position on your side. If your pain worsens with extension-based activities  (prolonged standing, working overhead), avoid resting in an extended position such as sleeping on your stomach. Most people will find more comfort when they rest with their spine in a more neutral position, neither too rounded nor too arched.  Lying on a non-sagging bed on your side with a pillow between your knees, or on your back with a pillow under your knees will often provide some relief. Keep in mind, being in any one position for a prolonged period of time, no matter how correct your posture, can still lead to stiffness.     PROPER SITTING POSTURE In order to minimize stress and discomfort on your spine, you must sit with correct posture. Sitting with good posture should be effortless for a healthy body. Returning to good posture is a gradual process. Many people can work toward this most comfortably by using various supports until they have the flexibility and strength to maintain this posture on their own.   When sitting with proper posture, your ears will fall over your shoulders and your shoulders will fall over your hips. You should use the back of the chair to support your upper back. Your lower back will be in a neutral position, just slightly arched. You may place a small pillow or folded towel at the base of your lower back for   support.     When working at a desk, create an environment that supports good, upright posture. Without extra support, muscles tire, which leads to excessive strain on joints and other tissues.  Keep these recommendations in  mind:   CHAIR:    A chair should be able to slide under your desk when your back makes contact with the back of the chair. This allows you to work closely.  The chair's height should allow your eyes to be level with the upper part of your monitor and your hands to be slightly lower than your elbows.  BODY POSITION  Your feet should make contact with the floor. If this is not possible, use a foot rest.  Keep your ears over your  shoulders. This will reduce stress on your neck and lower back.     INCORRECT SITTING POSTURES  If you are feeling tired and unable to assume a healthy sitting posture, do not slouch or slump. This puts excessive strain on your back tissues, causing more damage and pain. Healthier options include:  Using more support, like a lumbar pillow.  Switching tasks to something that requires you to be upright or walking.  Talking a brief walk.  Lying down to rest in a neutral-spine position.      PROLONGED STANDING WHILE SLIGHTLY LEANING FORWARD  When completing a task that requires you to lean forward while standing in one place for a long time, place either foot up on a stationary 2-4 inch high object to help maintain the best posture. When both feet are on the ground, the lower back tends to lose its slight inward curve. If this curve flattens (or becomes too large), then the back and your other joints will experience too much stress, tire more quickly, and can cause pain.     CORRECT STANDING POSTURES Proper standing posture should be assumed with all daily activities, even if they only take a few moments, like when brushing your teeth. As in sitting, your ears should fall over your shoulders and your shoulders should fall over your hips. You should keep a slight tension in your abdominal muscles to brace your spine. Your tailbone should point down to the ground, not behind your body, resulting in an over-extended swayback posture.       INCORRECT STANDING POSTURES  Common incorrect standing postures include a forward head, locked knees and/or an excessive swayback.     WALKING Walk with an upright posture. Your ears, shoulders and hips should all line-up.     PROLONGED ACTIVITY IN A FLEXED POSITION When completing a task that requires you to bend forward at your waist or lean over a low surface, try to find a way to stabilize 3 out of 4 of your limbs. You can place a hand or elbow on  your thigh or rest a knee on the surface you are reaching across. This will provide you more stability so that your muscles do not fatigue as quickly. By keeping your knees relaxed, or slightly bent, you will also reduce stress across your lower back.     CORRECT LIFTING TECHNIQUES DO :     Assume a wide stance. This will provide you more stability and the opportunity to get as close as possible to the object which you are lifting.  Tense your abdominals to brace your spine. Bend at the knees and hips. Keeping your back locked in a neutral-spine position, lift using your leg muscles. Lift with your legs, keeping your back straight.  Test the weight of unknown objects before attempting to lift them.  Try to keep your elbows locked down at your sides in order get the best strength from your shoulders when carrying an object.  Always ask for help when lifting heavy or awkward objects.    INCORRECT LIFTING TECHNIQUES DO NOT:   Lock your knees when lifting, even if it is a small object.  Bend and twist.  Pivot at your feet or move your feet when needing to change directions.  Assume that you can safely pick up even a paperclip without proper posture.   Document Released: 09/08/2005  Document Re-Released: 07/06/2009 [employer] Patient Information 2011 [place]

## 2014-04-07 NOTE — Assessment & Plan Note (Signed)
Pertinent S&O  Right-sided lower back pain without red flags for approximately 4 weeks  Likely due to recent foot fracture and having to wear boot as well as inactivity  History of ovarian cyst; complains of night sweats and occasional sharp right lower quadrant pain;Denies weight loss Plan  Refer patient for physical therapy  Also provided Patient with back stretches/exercises see AVS  Rx for baclofen #15; discussed with patient is will not be a long-term therapy  Advised moist heat 2-3 times daily  If pain not improved with conservative therapy consider imaging given history of ovarian cyst  Follow up in 1 month

## 2014-04-07 NOTE — Progress Notes (Signed)
Subjective:     Patient ID: [name], female   DOB: 01-31-1966, 48 y.o.   MRN: [id number]  HPI Comments: Presents today for followup of right back pain, and right lower quadrant abdominal pain.  RLQ back pain - She complains today of right lower back pain, and present for approximately one month.  She describes the pain as soreness or a long cramp.  It does not radiate down either leg.  Pain is made worse with long periods of sitting or inactivity. Nothing makes it better. She does report waking up in the morning wet from sweat but deneis fevers, chills, weight loss or Hx of CA. She take NSAIDs for her foot fracture and has noticed some improvement with this. She fell and fractured her foot previously and has been in a boot from April until recently. She denies any previous back trauma or surgeries.   Abdominal pain - Hx of ovarian cyst and Depo shot for treatment. She reports periodic sharp RLQ pain like  "Someone is poking me". Her LMP was over a year ago. She denies any recent vaginal bleeding, or pain.  She denies any nausea, vomiting, or diarrhea.  Denies any melena or hematochezia.     Review of Systems See HPI     Objective:   Physical Exam  Constitutional: She appears well-developed.  obese  Cardiovascular: Normal rate, regular rhythm and normal heart sounds.   Pulmonary/Chest: Effort normal and breath sounds normal.  Abdominal: Soft. Bowel sounds are normal. She exhibits no distension and no mass. There is no rebound and no guarding.  Mild RLQ tenderness to deep palpation   Back Exam: Inspection: Alignment normal Right paraspinal tenderness with no obvious muscle spasm or tension; no spinal process tenderness with percussion Full range of motion with extension and flexion SLR and XSLR seated & lying: Negaitive FABER: Negative bilaterally Sensory: Intact and symmetric Reflex: Patellar and ankle 2+ bilaterally Strength: 5/5 hip extension, flexion; knee extension,  flexion; ankle plantar flexion, and dorsiflexion  Assessment/Plan:      See Problem Focused Assessment & Plan

## 2014-04-08 ENCOUNTER — Inpatient Hospital Stay (HOSPITAL_COMMUNITY): Payer: Medicaid Other

## 2014-04-08 ENCOUNTER — Inpatient Hospital Stay (HOSPITAL_COMMUNITY)
Admission: AD | Admit: 2014-04-08 | Discharge: 2014-04-08 | Disposition: A | Discharge status: Home / Self Care | Payer: Medicaid Other | Source: Ambulatory Visit | Attending: Obstetrics & Gynecology | Admitting: Obstetrics & Gynecology

## 2014-04-08 ENCOUNTER — Encounter (HOSPITAL_COMMUNITY): Payer: Self-pay

## 2014-04-08 DIAGNOSIS — N760 Acute vaginitis: Secondary | ICD-10-CM | POA: Insufficient documentation

## 2014-04-08 DIAGNOSIS — A499 Bacterial infection, unspecified: Secondary | ICD-10-CM | POA: Insufficient documentation

## 2014-04-08 DIAGNOSIS — D259 Leiomyoma of uterus, unspecified: Secondary | ICD-10-CM | POA: Insufficient documentation

## 2014-04-08 DIAGNOSIS — B9689 Other specified bacterial agents as the cause of diseases classified elsewhere: Secondary | ICD-10-CM | POA: Insufficient documentation

## 2014-04-08 DIAGNOSIS — R1031 Right lower quadrant pain: Secondary | ICD-10-CM | POA: Insufficient documentation

## 2014-04-08 DIAGNOSIS — I10 Essential (primary) hypertension: Secondary | ICD-10-CM | POA: Insufficient documentation

## 2014-04-08 DIAGNOSIS — R002 Palpitations: Secondary | ICD-10-CM | POA: Insufficient documentation

## 2014-04-08 DIAGNOSIS — N83209 Unspecified ovarian cyst, unspecified side: Secondary | ICD-10-CM | POA: Insufficient documentation

## 2014-04-08 LAB — CBC WITH DIFFERENTIAL/PLATELET
BASOS ABS: 0 10*3/uL (ref 0.0–0.1)
BASOS PCT: 1 % (ref 0–1)
Eosinophils Absolute: 0.1 10*3/uL (ref 0.0–0.7)
Eosinophils Relative: 2 % (ref 0–5)
HCT: 36.7 % (ref 36.0–46.0)
HEMOGLOBIN: 12.4 g/dL (ref 12.0–15.0)
Lymphocytes Relative: 24 % (ref 12–46)
Lymphs Abs: 1.6 10*3/uL (ref 0.7–4.0)
MCH: 28.8 pg (ref 26.0–34.0)
MCHC: 33.8 g/dL (ref 30.0–36.0)
MCV: 85.2 fL (ref 78.0–100.0)
MONO ABS: 0.6 10*3/uL (ref 0.1–1.0)
Monocytes Relative: 9 % (ref 3–12)
Neutro Abs: 4.2 10*3/uL (ref 1.7–7.7)
Neutrophils Relative %: 64 % (ref 43–77)
Platelets: 268 10*3/uL (ref 150–400)
RBC: 4.31 MIL/uL (ref 3.87–5.11)
RDW: 13.3 % (ref 11.5–15.5)
WBC: 6.5 10*3/uL (ref 4.0–10.5)

## 2014-04-08 LAB — WET PREP, GENITAL
TRICH WET PREP: NONE SEEN
Yeast Wet Prep HPF POC: NONE SEEN

## 2014-04-08 LAB — URINALYSIS, ROUTINE W REFLEX MICROSCOPIC
Bilirubin Urine: NEGATIVE
GLUCOSE, UA: NEGATIVE mg/dL
Hgb urine dipstick: NEGATIVE
KETONES UR: NEGATIVE mg/dL
Nitrite: NEGATIVE
PROTEIN: NEGATIVE mg/dL
Specific Gravity, Urine: 1.02 (ref 1.005–1.030)
Urobilinogen, UA: 0.2 mg/dL (ref 0.0–1.0)
pH: 5.5 (ref 5.0–8.0)

## 2014-04-08 LAB — URINE MICROSCOPIC-ADD ON

## 2014-04-08 LAB — POCT PREGNANCY, URINE: Preg Test, Ur: NEGATIVE

## 2014-04-08 MED ORDER — KETOROLAC TROMETHAMINE 60 MG/2ML IM SOLN
60.0000 mg | Freq: Once | INTRAMUSCULAR | Status: AC
Start: 2014-04-08 — End: 2014-04-08
  Administered 2014-04-08: 60 mg via INTRAMUSCULAR
  Filled 2014-04-08: qty 2

## 2014-04-08 MED ORDER — KETOROLAC TROMETHAMINE 60 MG/2ML IM SOLN: 60.0000 mg | Freq: Once | INTRAMUSCULAR | Status: DC

## 2014-04-08 MED ORDER — METRONIDAZOLE 500 MG PO TABS: 500.0000 mg | ORAL_TABLET | Freq: Two times a day (BID) | ORAL | Status: DC
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
## 2014-04-08 NOTE — MAU Provider Note (Signed)
Attestation of Attending Supervision of Advanced Practitioner (CNM/NP): Evaluation and management procedures were performed by the Advanced Practitioner under my supervision and collaboration.  I have reviewed the Advanced Practitioner's note and chart, and I agree with the management and plan.  HARRAWAY-SMITH, Meryn Sarracino 7:29 PM

## 2014-04-08 NOTE — MAU Note (Signed)
Pt c/o constant sharp right lower abdominal pain x1 month. Feels like the right side is swelling. Went to primary care physician twice this month. Has hx of ovarian cysts.

## 2014-04-08 NOTE — Discharge Instructions (Signed)
Fibroids Fibroids are lumps (tumors) that can occur any place in a woman's body. These lumps are not cancerous. Fibroids vary in size, weight, and where they grow. HOME CARE  Do not take aspirin.  Write down the number of pads or tampons you use during your period. Tell your doctor. This can help determine the best treatment for you. GET HELP RIGHT AWAY IF:  You have pain in your lower belly (abdomen) that is not helped with medicine.  You have cramps that are not helped with medicine.  You have more bleeding between or during your period.  You feel lightheaded or pass out (faint).  Your lower belly pain gets worse. MAKE SURE YOU:  Understand these instructions.  Will watch your condition.  Will get help right away if you are not doing well or get worse. Document Released: 10/11/2010 Document Revised: 12/01/2011 Document Reviewed: 10/11/2010 ExitCare Patient Information 2015 [employer]. This information is not intended to replace advice given to you by your health care provider. Make sure you discuss any questions you have with your health care provider.  

## 2014-04-10 LAB — GC/CHLAMYDIA PROBE AMP
CT PROBE, AMP APTIMA: NEGATIVE
GC PROBE AMP APTIMA: NEGATIVE

## 2014-04-14 ENCOUNTER — Telehealth: Payer: Self-pay

## 2014-04-14 ENCOUNTER — Ambulatory Visit: Payer: Medicaid Other | Admitting: Obstetrics & Gynecology

## 2014-04-14 NOTE — Telephone Encounter (Signed)
Patient missed MAU f/u appointment today. Attempted to call patient. Number invalid. Will send letter.

## 2014-05-07 ENCOUNTER — Other Ambulatory Visit: Payer: Self-pay | Admitting: Family Medicine

## 2014-05-09 ENCOUNTER — Other Ambulatory Visit: Payer: Self-pay | Admitting: *Deleted

## 2014-05-10 MED ORDER — HYDROCHLOROTHIAZIDE 25 MG PO TABS: 25.0000 mg | ORAL_TABLET | Freq: Every morning | ORAL | Status: DC

## 2014-05-11 ENCOUNTER — Telehealth: Payer: Self-pay | Admitting: Family Medicine

## 2014-05-11 ENCOUNTER — Other Ambulatory Visit: Payer: Self-pay | Admitting: Family Medicine

## 2014-05-11 NOTE — Telephone Encounter (Signed)
Received call from [facility] stating pt's Clonidine 0.1 mg Rx has expired.  Derl Barrow, RN

## 2014-05-11 NOTE — Telephone Encounter (Signed)
Pt called and was checking the status of her refill request. Please call her and let her know if this is going to be called today. jw

## 2014-05-11 NOTE — Telephone Encounter (Signed)
Pt called and needs a refill on her Clonidine sent in. jlw

## 2014-05-17 ENCOUNTER — Telehealth: Payer: Self-pay | Admitting: Family Medicine

## 2014-05-17 NOTE — Telephone Encounter (Signed)
Pt needs a letter for employer that clears her from light duty that Dr. Yong Channel put her on in May. She needs it to say that she is not longer wearing her boot and that she is fully capable to work.

## 2014-05-18 ENCOUNTER — Encounter: Payer: Self-pay | Admitting: Family Medicine

## 2014-05-18 NOTE — Progress Notes (Unsigned)
Pt came by stating that she needs a statement to be release to return back to work asap. Contact number [phone].

## 2014-06-08 ENCOUNTER — Ambulatory Visit (INDEPENDENT_AMBULATORY_CARE_PROVIDER_SITE_OTHER): Payer: Medicaid Other | Admitting: Family Medicine

## 2014-06-08 ENCOUNTER — Encounter: Payer: Self-pay | Admitting: Family Medicine

## 2014-06-08 VITALS — BP 141/96 | HR 71 | Temp 98.0°F | Wt 211.6 lb

## 2014-06-08 DIAGNOSIS — J0110 Acute frontal sinusitis, unspecified: Secondary | ICD-10-CM

## 2014-06-08 DIAGNOSIS — I10 Essential (primary) hypertension: Secondary | ICD-10-CM

## 2014-06-08 MED ORDER — DOXYCYCLINE HYCLATE 100 MG PO TABS: 100.0000 mg | ORAL_TABLET | Freq: Two times a day (BID) | ORAL | Status: DC

## 2014-06-08 MED ORDER — LISINOPRIL 10 MG PO TABS: 10.0000 mg | ORAL_TABLET | Freq: Every day | ORAL | Status: DC

## 2014-06-08 NOTE — Progress Notes (Signed)
  Patient name: [name] MRN [id number]  Date of birth: Jan 30, 1966  CC & HPI:  [name] is a 48 y.o. female presenting today for sinus pressure and HTN.   CHRONIC HYPERTENSION  BP Readings from Last 3 Encounters:  06/08/14 141/96  04/08/14 122/85  04/07/14 134/88   Disease Monitoring  Blood pressure range outside clinc: not checking  Chest pain: no   Dyspnea: no   Claudication: no  Medication compliance/financial difficulties: yes, she is dissatisfied with the clonidine as it makes her feel extremely tired and high  Medication Side Effects: Denies Dizziness/lightheadedness;    Preventitive Healthcare:   History  Smoking status  . Never Smoker   Smokeless tobacco  . Never Used   Sinus infection - She reports URI symptoms; runny nose, ear pain, sore throat, mild cough for the last week.  She also endorses facial pain/sinus pressure and headache for the last several days. Her symptoms have been gradually getting worse and are unrelieved with ibuprofen.  She denies any fevers, chills, chest pain, or shortness of breath.  Denies any problems with asthma or allergies.  She does not smoke.  ROS: See HPI   Medical & Surgical Hx:  Reviewed  Medications & Allergies: Reviewed  Social History: Reviewed:   Objective Findings:  Vitals: BP 141/96  Pulse 71  Temp(Src) 98 F (36.7 C) (Oral)  Wt 211 lb 9.6 oz (95.981 kg)  Gen: NAD Head: Pain with palpation of frontal and maxillary sinuses  Eyes:Sclera white; Conjunctiva pink;  Ears: TMs clear; Canals w/o lacerations; No external lesions Nose: Mucosa pink; Swollen turbinates Throat: Oral mucosa pink, Pharynx w/o exudates CV: RRR w/o m/r/g, pulses +2 b/l Resp: CTAB w/ normal respiratory effort  Assessment & Plan:   Please See Problem Focused Assessment & Plan

## 2014-06-08 NOTE — Assessment & Plan Note (Signed)
Blood pressure elevated today- possibly due to current sinus infection - She is dissatisfied with clonidine: makes her feel tired and high - Start lisinopril 5 mg x 1 week with instructions to increase to 10 mg - continue clonidine for one week then discontinue - follow-up in 3-4 weeks for blood pressure check and bmet

## 2014-06-08 NOTE — Patient Instructions (Signed)

## 2014-06-19 ENCOUNTER — Telehealth: Payer: Self-pay | Admitting: Family Medicine

## 2014-06-19 ENCOUNTER — Encounter (HOSPITAL_COMMUNITY): Payer: Self-pay | Admitting: Family Medicine

## 2014-06-19 NOTE — Telephone Encounter (Signed)
Will forward to MD to see if he will write this letter. Jazmin Hartsell,CMA

## 2014-06-19 NOTE — Telephone Encounter (Signed)
Pt called and would like a letter stating that she can be out of work for the next 3-4 days. The antibiotic she is on is causing headaches and just all around tired. It is hard to recuperate and working at the same time. Please call when this is ready for pick up. jw

## 2014-06-20 ENCOUNTER — Encounter: Payer: Self-pay | Admitting: Family Medicine

## 2014-07-04 ENCOUNTER — Ambulatory Visit: Payer: Self-pay | Admitting: Family Medicine

## 2014-07-07 ENCOUNTER — Ambulatory Visit: Payer: Self-pay | Admitting: Family Medicine

## 2014-07-10 ENCOUNTER — Encounter: Payer: Self-pay | Admitting: Family Medicine

## 2014-07-10 ENCOUNTER — Ambulatory Visit (INDEPENDENT_AMBULATORY_CARE_PROVIDER_SITE_OTHER): Payer: Self-pay | Admitting: Family Medicine

## 2014-07-10 VITALS — BP 130/92 | HR 77 | Temp 98.0°F | Ht 65.5 in | Wt 213.0 lb

## 2014-07-10 DIAGNOSIS — J019 Acute sinusitis, unspecified: Secondary | ICD-10-CM

## 2014-07-10 DIAGNOSIS — J0100 Acute maxillary sinusitis, unspecified: Secondary | ICD-10-CM | POA: Insufficient documentation

## 2014-07-10 HISTORY — DX: Acute maxillary sinusitis, unspecified: J01.00

## 2014-07-10 MED ORDER — AMOXICILLIN-POT CLAVULANATE 875-125 MG PO TABS: 1.0000 | ORAL_TABLET | Freq: Two times a day (BID) | ORAL | Status: DC

## 2014-07-10 NOTE — Progress Notes (Signed)
   Subjective:    Patient ID: [name], female    DOB: 10/22/1965, 48 y.o.   MRN: [id number]  Patient presents for a same day appointment.  HPI  SINUS PRESSURE / PRODUCTIVE COUGH / FEVER: - Recent OV with PCP 06/08/14 for sinus pain and pressure, found to have sinus infection also changed BP medications. Given antibiotic Doxycycline took it a few days later, did not complete course (lost 2 doses). Reported some significant improvement for few days, and then seemed to get worse about 1-2 weeks ago. States she had returned to work at [employer] unit (positive sick contacts) and thinks she may have been "exposed to something" - Currently admits to persistent cough (now productive, yellow-green sputum), progressed to point of sore throat (right side and right ear), with one episode of vomiting. Additionally admits to associated facial pain and pressure, worse with "leaning forward", occasional low-grade fever (99.8 to 100F), admits to feeling "sensation in throat" difficult to cough it up. Tolerating PO intake well, trying to stay hydrated. - Recently has missed recent nursing class. Stated she was exposed to some Latex gloves at nursing class despite allergy, thought this may have made her more sick - Currently taking OTC decongestant x 2 days includes Tylenol without relief. Tried some Ibuprofen (600mg  q 6 hr) without significant relief. - Admits to occasional nausea - Denies persistent fevers/chills, chest pain, shortness of breath, abdominal pain, diarrhea  - Has not had influenza vaccine in 2015  I have reviewed and updated the following as appropriate: allergies and current medications  Social Hx: - Never smoker  Review of Systems  See above HPI    Objective:   Physical Exam  BP 130/92  Pulse 77  Temp(Src) 98 F (36.7 C) (Oral)  Ht 5' 5.5" (1.664 m)  Wt 213 lb (96.616 kg)  BMI 34.89 kg/m2  SpO2 98%  Gen - ill-appearing but non-toxic, cooperative, conversational,  NAD HEENT - NCAT with bilateral frontal and maxillary sinus tenderness on palpation, transillumination symmetrical without significant obstruction, PERRL, EOMI, TM's bilaterally normal appearing without erythema or bulging, normal light reflex, possible slight effusion R>L, bilateral edematous turbinates with mild congestion, oropharynx clear, without erythema, exudates, or asymmetry, MMM Neck - supple, mild tender LAD on Right anterior cervical Heart - RRR, no murmurs heard Lungs - CTAB, no wheezing, crackles, or rhonchi. Normal work of breathing. Skin - dry     Assessment & Plan:   See specific A&P problem list for details.

## 2014-07-10 NOTE — Patient Instructions (Addendum)
Dear [name], Thank you for coming in to clinic today.  1. It sounds like you have a persistent Sinus infection. Often times this will linger, it can get better and now it sounds like it has been getting worse. You most likely did not have a complete response to the Doxycycline. 2. Sent antibiotic for Augmentin to your pharmacy (take 1 tablet twice daily, every 12 hours) for 10 days. Important to complete the entire course and do not miss any doses. 3. Additionally, as discussed, recommend taking regular Ibuprofen up to 600mg  every 6 hours as needed for the next 3-5 days. You can also take Tylenol as needed between doses. 4. Recommend trying Mucinex to help your cough, do this for about 1 week or less, but do not continue this longer than 1 week. 5. Try moist heat to relieve sinus pressure, and also the sinus massage technique as demonstrated.  Once you feel better and are no longer sick, make sure that you get a flu shot.  Some important numbers from today's visit: BP - 130/92  Please schedule a follow-up appointment with Dr. Berkley Harvey in 1-2 weeks for follow-up if not improving after completing antibiotic. Follow as previously scheduled for Blood Pressure.  If you have any other questions or concerns, please feel free to call the clinic to contact me. You may also schedule an earlier appointment if necessary.  However, if your symptoms get significantly worse, please go to the Emergency Department to seek immediate medical attention.  [name], [place]

## 2014-07-10 NOTE — Assessment & Plan Note (Signed)
Consistent with subacute sinusitis, with >4 week duration, seems to have had only partial response to incomplete course Doxycycline, now with "second sickness" and recent worsening - afebrile, ill still well-appearing, exam localized to R>L frontal / maxillary sinuses, no other source of infection identified. Lungs clear despite productive cough  Plan: 1. Augmentin 875-125 BID x 10 days for presumed bacterial sinus infection 2. Recommended regular Ibuprofen, Tylenol, Mucinex 3. Advised moist heat and sinus effleurage as demonstrated 4. Note for school written 5. RTC 1-2 weeks if worsening or no improvement

## 2014-07-11 ENCOUNTER — Other Ambulatory Visit: Payer: Self-pay | Admitting: Family Medicine

## 2014-07-11 NOTE — Telephone Encounter (Signed)
Pt called and needs a refill on her BP medication called in. jw °

## 2014-07-12 ENCOUNTER — Telehealth: Payer: Self-pay | Admitting: *Deleted

## 2014-07-12 MED ORDER — HYDROCHLOROTHIAZIDE 25 MG PO TABS: 25.0000 mg | ORAL_TABLET | Freq: Every morning | ORAL | Status: DC

## 2014-07-12 MED ORDER — LISINOPRIL 10 MG PO TABS: 10.0000 mg | ORAL_TABLET | Freq: Every day | ORAL | Status: DC

## 2014-07-12 NOTE — Telephone Encounter (Signed)
LMOVM for pt to return call. Please advise that rx was refilled for one month but she will need a refill prior to additional ones. Fleeger, Salome Spotted     ----- Message from Olam Idler, MD sent at 07/12/2014 8:49 AM ----- Please call and have her schedule PCP apt prior to next refill. Needs BP check and blood work since switching medications

## 2014-07-13 ENCOUNTER — Telehealth: Payer: Self-pay | Admitting: Family Medicine

## 2014-07-13 MED ORDER — LISINOPRIL 10 MG PO TABS: 10.0000 mg | ORAL_TABLET | Freq: Every day | ORAL | Status: DC

## 2014-07-13 NOTE — Telephone Encounter (Signed)
Patient calls because she wanted a refill on her lisinopril. She called in a few days ago and Dr Berkley Harvey had approved one month refill and asked that patient follow up before next refill for repeat labwork and BP check due to med change. The refill had accidentally been printed yesterday, so I refilled and sent electronically. Again, let patient know that follow up is necessary within the month to recheck BP and labwork (cr, K). She voiced understanding.  Cassandra Sinclair, MD PGY-3, [place]

## 2014-07-24 ENCOUNTER — Encounter: Payer: Self-pay | Admitting: Family Medicine

## 2014-08-03 ENCOUNTER — Telehealth: Payer: Self-pay | Admitting: Family Medicine

## 2014-08-03 NOTE — Telephone Encounter (Signed)
Pt called and would like to speak to Dr. Berkley Harvey concerning her new BP medication. She said that she isn't sure if it is working or not. Dr. Berkley Harvey doesn't have any appointments until 11/30 and she will be out of this medication by then, She is wanting to know if she should take another dose at night or can she see other doctor. Please call to discuss with patient. jw

## 2014-08-04 ENCOUNTER — Other Ambulatory Visit: Payer: Self-pay | Admitting: Family Medicine

## 2014-08-04 MED ORDER — LISINOPRIL 10 MG PO TABS: 10.0000 mg | ORAL_TABLET | Freq: Every day | ORAL | Status: DC

## 2014-08-04 NOTE — Telephone Encounter (Signed)
Called pt and made apt for 11/24 @ 10am. Called in Lisinopril refill in the meantime.

## 2014-08-15 ENCOUNTER — Ambulatory Visit: Payer: Self-pay | Admitting: Family Medicine

## 2014-09-08 ENCOUNTER — Ambulatory Visit: Payer: Self-pay | Admitting: Family Medicine

## 2014-09-08 ENCOUNTER — Encounter: Payer: Self-pay | Admitting: Family Medicine

## 2014-09-08 ENCOUNTER — Ambulatory Visit (INDEPENDENT_AMBULATORY_CARE_PROVIDER_SITE_OTHER): Payer: Self-pay | Admitting: Family Medicine

## 2014-09-08 VITALS — BP 131/84 | HR 69 | Temp 97.8°F | Ht 66.0 in | Wt 204.8 lb

## 2014-09-08 DIAGNOSIS — N898 Other specified noninflammatory disorders of vagina: Secondary | ICD-10-CM

## 2014-09-08 DIAGNOSIS — R300 Dysuria: Secondary | ICD-10-CM

## 2014-09-08 DIAGNOSIS — I10 Essential (primary) hypertension: Secondary | ICD-10-CM

## 2014-09-08 DIAGNOSIS — A599 Trichomoniasis, unspecified: Secondary | ICD-10-CM

## 2014-09-08 DIAGNOSIS — Z113 Encounter for screening for infections with a predominantly sexual mode of transmission: Secondary | ICD-10-CM

## 2014-09-08 LAB — POCT URINALYSIS DIPSTICK
Glucose, UA: NEGATIVE
KETONES UA: NEGATIVE
Nitrite, UA: NEGATIVE
Protein, UA: 30
SPEC GRAV UA: 1.025
Urobilinogen, UA: 0.2
pH, UA: 6

## 2014-09-08 LAB — BASIC METABOLIC PANEL
BUN: 15 mg/dL (ref 6–23)
CALCIUM: 9.9 mg/dL (ref 8.4–10.5)
CO2: 25 mEq/L (ref 19–32)
CREATININE: 0.9 mg/dL (ref 0.50–1.10)
Chloride: 103 mEq/L (ref 96–112)
Glucose, Bld: 99 mg/dL (ref 70–99)
Potassium: 3.7 mEq/L (ref 3.5–5.3)
Sodium: 140 mEq/L (ref 135–145)

## 2014-09-08 LAB — POCT WET PREP (WET MOUNT): Clue Cells Wet Prep Whiff POC: POSITIVE

## 2014-09-08 LAB — POCT UA - MICROSCOPIC ONLY

## 2014-09-08 LAB — HIV ANTIBODY (ROUTINE TESTING W REFLEX): HIV 1&2 Ab, 4th Generation: NONREACTIVE

## 2014-09-08 LAB — RPR

## 2014-09-08 MED ORDER — HYDROCHLOROTHIAZIDE 25 MG PO TABS: 25.0000 mg | ORAL_TABLET | Freq: Every morning | ORAL | Status: DC

## 2014-09-08 MED ORDER — LISINOPRIL 10 MG PO TABS: 10.0000 mg | ORAL_TABLET | Freq: Every day | ORAL | Status: DC

## 2014-09-08 NOTE — Assessment & Plan Note (Signed)
At goal today. No adverse reactions to current regimen. Plan: check bmet, refills for hctz 25mg  and lisinopril 10mg  for 3 months, f/u 3 months

## 2014-09-08 NOTE — Progress Notes (Signed)
   Subjective:    Patient ID: [name], female    DOB: 1965-12-04, 48 y.o.   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  She has been sexually active with a new partner, started dating in August but have since stopped. Did not use condoms. ROS: no nausea/vomiting, no diarrhea/constipation, no fevers/chills  # Hypertension  Also here for follow up of BP  Discontinued clonidine and started lisinopril several months ago  Reports no issues with medications, no adverse reactions (no cough or lip/facial swelling) ROS: no dizziness, no headaches, no chest pains, no SOB  Review of Systems   See HPI for ROS. All other systems reviewed and are negative.  Past medical history, surgical, family, and social history reviewed and updated in the EMR as appropriate. Objective:  BP 131/84 mmHg  Pulse 69  Temp(Src) 97.8 F (36.6 C) (Oral)  Ht 5\' 6"  (1.676 m)  Wt 204 lb 12.8 oz (92.897 kg)  BMI 33.07 kg/m2 Vitals reviewed  General: NAD CV: RRR, no murmur Resp: Clear bilaterally Abdomen: soft, mildly tender lower abdomen bilaterally no rebound or guarding, no specific worsened tenderness over suprapubic area. Normal bowel sounds GU: normal external genitalia, no redness or irritation noted at introitus, no draining discharge. NAVM, thin yellow-green discharge in vaginal vault, not draining from cervical os.  Assessment & Plan:  See Problem List Documentation

## 2014-09-08 NOTE — Assessment & Plan Note (Addendum)
Trichomonas on wet prep today. Attempted to call phone number (went over this number during clinic before patient left) which rings as invalid/disconnected number. Tried [phone] and [phone]. Also tried emergency contact number in chart and also invalid/disconnected.  Plan: sent in prescription for flagyl BID x 7 days. Will send letter as well discussing treatment of partners and abstinence until completed treatment.

## 2014-09-12 ENCOUNTER — Telehealth: Payer: Self-pay | Admitting: Family Medicine

## 2014-09-12 NOTE — Telephone Encounter (Signed)
LMOVM for pt to return call .Fleeger, Jessica Dawn  

## 2014-09-12 NOTE — Telephone Encounter (Signed)
Pt called and would like someone to call her with her test results from last week. jw

## 2014-09-12 NOTE — Telephone Encounter (Signed)
Pt called back because she had put the wrong number in our system. Her correct number is [phone]. Please call patient [name]

## 2014-09-12 NOTE — Telephone Encounter (Signed)
Phone number in chart DOES NOT work: [phone]. I already tried calling this number last Friday, and also tried [phone] since she said that out loud during the visit, which also does not work. I sent a letter with test results. If she calls back please either read the letter to her (by [facility] team nurse) or get an updated phone number so that I can talk to her myself. Thank you. -Dr. Lamar Benes

## 2014-09-12 NOTE — Telephone Encounter (Signed)
Patient informed of results, expressed understanding.

## 2014-09-13 ENCOUNTER — Telehealth: Payer: Self-pay | Admitting: Family Medicine

## 2014-09-13 DIAGNOSIS — N760 Acute vaginitis: Principal | ICD-10-CM

## 2014-09-13 DIAGNOSIS — B9689 Other specified bacterial agents as the cause of diseases classified elsewhere: Secondary | ICD-10-CM

## 2014-09-13 MED ORDER — METRONIDAZOLE 500 MG PO TABS: 500.0000 mg | ORAL_TABLET | Freq: Two times a day (BID) | ORAL | Status: DC

## 2014-09-13 NOTE — Telephone Encounter (Signed)
I must've not signed the order. Rx sent and confirmed sent in chart! -Dr. Lamar Benes.

## 2014-09-13 NOTE — Telephone Encounter (Signed)
Pt is correct, no medication sent it. Will forward to MD. Clinton Sawyer, Salome Spotted

## 2014-09-13 NOTE — Telephone Encounter (Signed)
Pt called back because she was told that the doctor was sending in medication to the pharmacy. Please call patient [name]

## 2014-10-23 ENCOUNTER — Telehealth: Payer: Self-pay | Admitting: *Deleted

## 2014-10-23 NOTE — Telephone Encounter (Signed)
Pt left voice message on medical records needing a copy of Immunization.  Pt informed that a copy of Tdap will be placed up front for her pick up.  Derl Barrow, RN

## 2014-11-20 ENCOUNTER — Ambulatory Visit (INDEPENDENT_AMBULATORY_CARE_PROVIDER_SITE_OTHER): Payer: Self-pay | Admitting: Family Medicine

## 2014-11-20 VITALS — BP 118/80 | HR 86 | Temp 97.8°F | Wt 215.0 lb

## 2014-11-20 DIAGNOSIS — N760 Acute vaginitis: Secondary | ICD-10-CM

## 2014-11-20 DIAGNOSIS — A599 Trichomoniasis, unspecified: Secondary | ICD-10-CM

## 2014-11-20 DIAGNOSIS — A499 Bacterial infection, unspecified: Secondary | ICD-10-CM

## 2014-11-20 DIAGNOSIS — B9689 Other specified bacterial agents as the cause of diseases classified elsewhere: Secondary | ICD-10-CM

## 2014-11-20 DIAGNOSIS — F411 Generalized anxiety disorder: Secondary | ICD-10-CM

## 2014-11-20 MED ORDER — METRONIDAZOLE 500 MG PO TABS: 500.0000 mg | ORAL_TABLET | Freq: Two times a day (BID) | ORAL | Status: DC

## 2014-11-20 MED ORDER — BUSPIRONE HCL 7.5 MG PO TABS: 7.5000 mg | ORAL_TABLET | Freq: Two times a day (BID) | ORAL | Status: DC
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
## 2014-11-20 NOTE — Patient Instructions (Signed)
Flagyl: take one tablet twice a day for 7 days  Buspirone: take 7.5mg  twice a day. Follow up in about 1 month to see how you are during.

## 2014-11-21 DIAGNOSIS — F411 Generalized anxiety disorder: Secondary | ICD-10-CM | POA: Insufficient documentation

## 2014-11-21 DIAGNOSIS — F419 Anxiety disorder, unspecified: Secondary | ICD-10-CM | POA: Insufficient documentation

## 2014-11-21 NOTE — Assessment & Plan Note (Signed)
Did not get adequate treatment, still with symptoms. Elected to not repeat wet prep today, will give additional rx for flagyl, if still symptomatic after this pt to schedule f/u.

## 2014-11-21 NOTE — Assessment & Plan Note (Signed)
Multiple stressors, anxiety recurring and keeping her up with preoccupied thoughts. Pt wants to restart buspar. Rx for buspar 7.5mg  bid, f/u 1 month.

## 2014-12-19 ENCOUNTER — Inpatient Hospital Stay (HOSPITAL_COMMUNITY)
Admission: AD | Admit: 2014-12-19 | Discharge: 2014-12-19 | Disposition: A | Discharge status: Home / Self Care | Payer: Self-pay | Source: Ambulatory Visit | Attending: Family Medicine | Admitting: Family Medicine

## 2014-12-19 ENCOUNTER — Other Ambulatory Visit: Payer: Self-pay | Admitting: Family Medicine

## 2014-12-19 ENCOUNTER — Encounter (HOSPITAL_COMMUNITY): Payer: Self-pay | Admitting: *Deleted

## 2014-12-19 DIAGNOSIS — Z91040 Latex allergy status: Secondary | ICD-10-CM | POA: Insufficient documentation

## 2014-12-19 DIAGNOSIS — Z7982 Long term (current) use of aspirin: Secondary | ICD-10-CM | POA: Insufficient documentation

## 2014-12-19 DIAGNOSIS — N898 Other specified noninflammatory disorders of vagina: Secondary | ICD-10-CM

## 2014-12-19 DIAGNOSIS — M549 Dorsalgia, unspecified: Secondary | ICD-10-CM | POA: Insufficient documentation

## 2014-12-19 DIAGNOSIS — R102 Pelvic and perineal pain: Secondary | ICD-10-CM | POA: Insufficient documentation

## 2014-12-19 DIAGNOSIS — Z882 Allergy status to sulfonamides status: Secondary | ICD-10-CM | POA: Insufficient documentation

## 2014-12-19 DIAGNOSIS — I10 Essential (primary) hypertension: Secondary | ICD-10-CM | POA: Insufficient documentation

## 2014-12-19 HISTORY — DX: Unspecified fracture of unspecified toe(s), initial encounter for closed fracture: S92.919A

## 2014-12-19 LAB — WET PREP, GENITAL
CLUE CELLS WET PREP: NONE SEEN
Trich, Wet Prep: NONE SEEN
Yeast Wet Prep HPF POC: NONE SEEN

## 2014-12-19 LAB — URINALYSIS, ROUTINE W REFLEX MICROSCOPIC
BILIRUBIN URINE: NEGATIVE
Glucose, UA: NEGATIVE mg/dL
HGB URINE DIPSTICK: NEGATIVE
KETONES UR: NEGATIVE mg/dL
Nitrite: NEGATIVE
PH: 5.5 (ref 5.0–8.0)
Protein, ur: NEGATIVE mg/dL
Specific Gravity, Urine: >1.03 — ABNORMAL HIGH (ref 1.005–1.030)
UROBILINOGEN UA: 0.2 mg/dL (ref 0.0–1.0)

## 2014-12-19 LAB — URINE MICROSCOPIC-ADD ON

## 2014-12-19 LAB — COMPREHENSIVE METABOLIC PANEL
ALT: 17 U/L (ref 0–35)
ANION GAP: 10 (ref 5–15)
AST: 14 U/L (ref 0–37)
Albumin: 4.2 g/dL (ref 3.5–5.2)
Alkaline Phosphatase: 73 U/L (ref 39–117)
BUN: 15 mg/dL (ref 6–23)
CALCIUM: 9.7 mg/dL (ref 8.4–10.5)
CO2: 25 mmol/L (ref 19–32)
CREATININE: 0.81 mg/dL (ref 0.50–1.10)
Chloride: 103 mmol/L (ref 96–112)
GFR calc non Af Amer: 85 mL/min — ABNORMAL LOW (ref >90)
GLUCOSE: 98 mg/dL (ref 70–99)
Potassium: 3.2 mmol/L — ABNORMAL LOW (ref 3.5–5.1)
Sodium: 138 mmol/L (ref 135–145)
TOTAL PROTEIN: 7.6 g/dL (ref 6.0–8.3)
Total Bilirubin: 0.3 mg/dL (ref 0.3–1.2)

## 2014-12-19 LAB — CBC
HCT: 36.6 % (ref 36.0–46.0)
HEMOGLOBIN: 11.9 g/dL — AB (ref 12.0–15.0)
MCH: 28.1 pg (ref 26.0–34.0)
MCHC: 32.5 g/dL (ref 30.0–36.0)
MCV: 86.5 fL (ref 78.0–100.0)
PLATELETS: 312 10*3/uL (ref 150–400)
RBC: 4.23 MIL/uL (ref 3.87–5.11)
RDW: 13.8 % (ref 11.5–15.5)
WBC: 8.2 10*3/uL (ref 4.0–10.5)

## 2014-12-19 MED ORDER — GI COCKTAIL ~~LOC~~
30.0000 mL | Freq: Once | ORAL | Status: AC
  Administered 2014-12-19: 30 mL via ORAL
  Filled 2014-12-19: qty 30

## 2014-12-19 MED ORDER — FLUCONAZOLE 150 MG PO TABS: 150.0000 mg | ORAL_TABLET | Freq: Once | ORAL | Status: DC

## 2014-12-19 NOTE — Telephone Encounter (Signed)
Needs refill on lisinopril and HCTZ [facility] on [address]

## 2014-12-19 NOTE — MAU Note (Signed)
having girl problems.  Having abd pain, started last weekend.  Had sex the wk before, had been about 3 months.  Just feels yuck down there, doesn't feel right. Noted a d/c today.  Pelvic pressure and pressure in low back
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
## 2014-12-19 NOTE — Discharge Instructions (Signed)
Pruritus  °Pruritus is an itch. There are many different problems that can cause an itch. Dry skin is one of the most common causes of itching. Most cases of itching do not require medical attention.  °HOME CARE INSTRUCTIONS  °Make sure your skin is moistened on a regular basis. A moisturizer that contains petroleum jelly is best for keeping moisture in your skin. If you develop a rash, you may try the following for relief:  °· Use corticosteroid cream. °· Apply cool compresses to the affected areas. °· Bathe with Epsom salts or baking soda in the bathwater. °· Soak in colloidal oatmeal baths. These are available at your pharmacy. °· Apply baking soda paste to the rash. Stir water into baking soda until it reaches a paste-like consistency. °· Use an anti-itch lotion. °· Take over-the-counter diphenhydramine medicine by mouth as the instructions direct. °· Avoid scratching. Scratching may cause the rash to become infected. If itching is very bad, your caregiver may suggest prescription lotions or creams to lessen your symptoms. °· Avoid hot showers, which can make itching worse. A cold shower may help with itching as long as you use a moisturizer after the shower. °SEEK MEDICAL CARE IF: °The itching does not go away after several days. °Document Released: 05/21/2011 Document Revised: 01/23/2014 Document Reviewed: 05/21/2011 °[employer]® Patient Information ©2015 [employer]. This information is not intended to replace advice given to you by your health care provider. Make sure you discuss any questions you have with your health care provider. ° °
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
## 2014-12-20 LAB — GC/CHLAMYDIA PROBE AMP (~~LOC~~) NOT AT ARMC
Chlamydia: NEGATIVE
Neisseria Gonorrhea: NEGATIVE

## 2014-12-20 LAB — HIV ANTIBODY (ROUTINE TESTING W REFLEX): HIV Screen 4th Generation wRfx: NONREACTIVE

## 2014-12-20 MED ORDER — LISINOPRIL 10 MG PO TABS: 10.0000 mg | ORAL_TABLET | Freq: Every day | ORAL | Status: DC

## 2014-12-20 MED ORDER — HYDROCHLOROTHIAZIDE 25 MG PO TABS: 25.0000 mg | ORAL_TABLET | Freq: Every morning | ORAL | Status: DC

## 2015-01-15 ENCOUNTER — Encounter (HOSPITAL_COMMUNITY): Payer: Self-pay | Admitting: *Deleted

## 2015-01-15 ENCOUNTER — Emergency Department (HOSPITAL_COMMUNITY)
Admission: EM | Admit: 2015-01-15 | Discharge: 2015-01-15 | Disposition: A | Discharge status: Home / Self Care | Payer: Self-pay | Attending: Emergency Medicine | Admitting: Emergency Medicine

## 2015-01-15 ENCOUNTER — Emergency Department (HOSPITAL_COMMUNITY): Payer: Self-pay

## 2015-01-15 DIAGNOSIS — Y998 Other external cause status: Secondary | ICD-10-CM | POA: Insufficient documentation

## 2015-01-15 DIAGNOSIS — Z8742 Personal history of other diseases of the female genital tract: Secondary | ICD-10-CM | POA: Insufficient documentation

## 2015-01-15 DIAGNOSIS — Z7982 Long term (current) use of aspirin: Secondary | ICD-10-CM | POA: Insufficient documentation

## 2015-01-15 DIAGNOSIS — S93401A Sprain of unspecified ligament of right ankle, initial encounter: Secondary | ICD-10-CM | POA: Insufficient documentation

## 2015-01-15 DIAGNOSIS — Z79899 Other long term (current) drug therapy: Secondary | ICD-10-CM | POA: Insufficient documentation

## 2015-01-15 DIAGNOSIS — Z8781 Personal history of (healed) traumatic fracture: Secondary | ICD-10-CM | POA: Insufficient documentation

## 2015-01-15 DIAGNOSIS — W19XXXA Unspecified fall, initial encounter: Secondary | ICD-10-CM

## 2015-01-15 DIAGNOSIS — Y9301 Activity, walking, marching and hiking: Secondary | ICD-10-CM | POA: Insufficient documentation

## 2015-01-15 DIAGNOSIS — W108XXA Fall (on) (from) other stairs and steps, initial encounter: Secondary | ICD-10-CM | POA: Insufficient documentation

## 2015-01-15 DIAGNOSIS — Z91040 Latex allergy status: Secondary | ICD-10-CM | POA: Insufficient documentation

## 2015-01-15 DIAGNOSIS — S9001XA Contusion of right ankle, initial encounter: Secondary | ICD-10-CM | POA: Insufficient documentation

## 2015-01-15 DIAGNOSIS — Z862 Personal history of diseases of the blood and blood-forming organs and certain disorders involving the immune mechanism: Secondary | ICD-10-CM | POA: Insufficient documentation

## 2015-01-15 DIAGNOSIS — F329 Major depressive disorder, single episode, unspecified: Secondary | ICD-10-CM | POA: Insufficient documentation

## 2015-01-15 DIAGNOSIS — Y9289 Other specified places as the place of occurrence of the external cause: Secondary | ICD-10-CM | POA: Insufficient documentation

## 2015-01-15 DIAGNOSIS — I10 Essential (primary) hypertension: Secondary | ICD-10-CM | POA: Insufficient documentation

## 2015-01-15 DIAGNOSIS — F419 Anxiety disorder, unspecified: Secondary | ICD-10-CM | POA: Insufficient documentation

## 2015-01-15 DIAGNOSIS — Z8709 Personal history of other diseases of the respiratory system: Secondary | ICD-10-CM | POA: Insufficient documentation

## 2015-01-15 DIAGNOSIS — Z792 Long term (current) use of antibiotics: Secondary | ICD-10-CM | POA: Insufficient documentation

## 2015-01-15 MED ORDER — OXYCODONE-ACETAMINOPHEN 5-325 MG PO TABS
1.0000 | ORAL_TABLET | Freq: Once | ORAL | Status: AC
Start: 2015-01-15 — End: 2015-01-15
  Administered 2015-01-15: 1 via ORAL
  Filled 2015-01-15: qty 1

## 2015-01-15 MED ORDER — HYDROCODONE-ACETAMINOPHEN 5-325 MG PO TABS: 1.0000 | ORAL_TABLET | ORAL | Status: DC | PRN
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
## 2015-01-15 NOTE — ED Notes (Signed)
Pt reports falling down a few steps today, pt twisted right ankle and now has burning pain to ankle.

## 2015-01-15 NOTE — Discharge Instructions (Signed)
Take the prescribed medication as directed for pain.  Recommend to ice and elevate ankle at home to help with pain/swelling. Follow-up with Dr. Veverly Fells if no improvement in the next week or if symptoms worsen. Return to the ED for new concerns.

## 2015-04-17 ENCOUNTER — Other Ambulatory Visit: Payer: Self-pay | Admitting: Family Medicine

## 2015-04-17 DIAGNOSIS — I10 Essential (primary) hypertension: Secondary | ICD-10-CM

## 2015-04-17 MED ORDER — HYDROCHLOROTHIAZIDE 25 MG PO TABS: 25.0000 mg | ORAL_TABLET | Freq: Every morning | ORAL | Status: DC

## 2015-04-17 MED ORDER — LISINOPRIL 10 MG PO TABS: 10.0000 mg | ORAL_TABLET | Freq: Every day | ORAL | Status: DC

## 2015-04-17 NOTE — Telephone Encounter (Signed)
Needs refill on hydrochlorothiazide (HYDRODIURIL) 25 MG and lisinopril (PRINIVIL,ZESTRIL) 10 MG tablet; sent to [facility] on [address] in [place]. Thanks, [employer], ASA

## 2015-04-25 ENCOUNTER — Encounter: Payer: Self-pay | Admitting: Family Medicine

## 2015-04-25 ENCOUNTER — Ambulatory Visit (INDEPENDENT_AMBULATORY_CARE_PROVIDER_SITE_OTHER): Payer: Self-pay | Admitting: Family Medicine

## 2015-04-25 ENCOUNTER — Other Ambulatory Visit (HOSPITAL_COMMUNITY)
Admission: RE | Admit: 2015-04-25 | Discharge: 2015-04-25 | Disposition: A | Discharge status: Home / Self Care | Payer: Self-pay | Source: Ambulatory Visit | Attending: Family Medicine | Admitting: Family Medicine

## 2015-04-25 VITALS — BP 120/88 | HR 86 | Temp 98.3°F | Ht 68.0 in | Wt 212.0 lb

## 2015-04-25 DIAGNOSIS — N760 Acute vaginitis: Secondary | ICD-10-CM

## 2015-04-25 DIAGNOSIS — B9689 Other specified bacterial agents as the cause of diseases classified elsewhere: Secondary | ICD-10-CM

## 2015-04-25 DIAGNOSIS — R350 Frequency of micturition: Secondary | ICD-10-CM

## 2015-04-25 DIAGNOSIS — Z113 Encounter for screening for infections with a predominantly sexual mode of transmission: Secondary | ICD-10-CM | POA: Insufficient documentation

## 2015-04-25 DIAGNOSIS — A64 Unspecified sexually transmitted disease: Secondary | ICD-10-CM

## 2015-04-25 DIAGNOSIS — N898 Other specified noninflammatory disorders of vagina: Secondary | ICD-10-CM

## 2015-04-25 DIAGNOSIS — Z202 Contact with and (suspected) exposure to infections with a predominantly sexual mode of transmission: Secondary | ICD-10-CM

## 2015-04-25 DIAGNOSIS — A499 Bacterial infection, unspecified: Secondary | ICD-10-CM

## 2015-04-25 LAB — POCT UA - MICROSCOPIC ONLY: Trichomonas, UA: POSITIVE

## 2015-04-25 LAB — POCT WET PREP (WET MOUNT): Clue Cells Wet Prep Whiff POC: POSITIVE

## 2015-04-25 LAB — POCT URINALYSIS DIPSTICK
Bilirubin, UA: NEGATIVE
GLUCOSE UA: NEGATIVE
KETONES UA: NEGATIVE
Nitrite, UA: NEGATIVE
Protein, UA: NEGATIVE
Spec Grav, UA: 1.015
UROBILINOGEN UA: 0.2
pH, UA: 6.5

## 2015-04-25 MED ORDER — METRONIDAZOLE 500 MG PO TABS: 500.0000 mg | ORAL_TABLET | Freq: Two times a day (BID) | ORAL | Status: DC

## 2015-04-25 MED ORDER — AZITHROMYCIN 500 MG PO TABS
1000.0000 mg | ORAL_TABLET | Freq: Once | ORAL | Status: AC
  Administered 2015-04-25: 1000 mg via ORAL

## 2015-04-25 MED ORDER — CEFTRIAXONE SODIUM 1 G IJ SOLR
250.0000 mg | Freq: Once | INTRAMUSCULAR | Status: AC
  Administered 2015-04-25: 250 mg via INTRAMUSCULAR

## 2015-04-25 NOTE — Assessment & Plan Note (Signed)
Wet prep positive for Trichomonas and BV.  Flagyl 500 mg twice a day 1 week - Ceftriaxone and azithromycin given to cover possible gonorrhea and chlamydia given cervical motion tenderness and lower back/pelvic pain - Advised patient to call if dysuria continues after completed and antibodies - She declined HIV and RPR testing today due to lack of insurance - Advised her to call if she develops any fevers, worsening abdominal, back or pelvic pain

## 2015-04-25 NOTE — Patient Instructions (Addendum)
It was great seeing you today.   1. You have bacterial vaginosis and trichomonas (STD).  Recommend your sexual partners being tested and treated.  2. Take Flagyl 500 mg, twice a day for 7 days.  Do not drink alcohol while taking this medication.  3. I will call you with the results of your other test.  4. Call if you develop any fevers, worsening back pain, or worsening abdominal pain.    Please bring all your medications to every doctors visit  Sign up for My Chart to have easy access to your labs results, and communication with your Primary care physician.  Next Appointment  Please make an appointment with Dr Berkley Harvey at your convenience to discuss your left leg pain   I look forward to talking with you again at our next visit. If you have any questions or concerns before then, please call the clinic at [phone].  Take Care,   Dr Phill Myron

## 2015-04-25 NOTE — Progress Notes (Signed)
  Patient name: [name] MRN [id number]  Date of birth: 1966/05/29  CC & HPI:  [name] is a 49 y.o. female presenting today for dysuria and vaginal discharge.  She reports green-yellow color to discharge the last week and a half.  Also endorses pain with urination that feels like vulvar irritation.  Endorses some bilateral lower back pain that is new.  Denies fevers or chills.  Completed Flagyl after last treatment for trichomonas several months ago.  Sexually active in the last month and half, but reports using condoms.   ROS: See HPI   Medications & Allergies: Reviewed  Social History: Reviewed:   Objective Findings:  Vitals: BP 120/88 mmHg  Pulse 86  Temp(Src) 98.3 F (36.8 C) (Oral)  Ht 5\' 8"  (1.727 m)  Wt 212 lb (96.163 kg)  BMI 32.24 kg/m2  LMP 10/06/2013  Gen: NAD CV: RRR w/o m/r/g, pulses +2 b/l Resp: CTAB w/ normal respiratory effort GI: No skin changes; BS + x 4 quads; mild suprapubic tenderness, mild right CVA tenderness to percussion Speculum Exam: Ext genitalia: wnl; Vaginal discharge: Thick, greenish discharge; Cervix: Petechiae Bimanual Exam:  Cervical motion tenderness present; No Vaginal wall defects; Adnexa nontender  Assessment & Plan:   Please See Problem Focused Assessment & Plan

## 2015-04-26 LAB — URINE CYTOLOGY ANCILLARY ONLY
CHLAMYDIA, DNA PROBE: NEGATIVE
NEISSERIA GONORRHEA: NEGATIVE

## 2015-04-27 ENCOUNTER — Encounter: Payer: Self-pay | Admitting: Family Medicine

## 2015-04-27 ENCOUNTER — Telehealth: Payer: Self-pay | Admitting: Family Medicine

## 2015-04-27 NOTE — Telephone Encounter (Signed)
Called but goes straight to VM.

## 2015-04-27 NOTE — Telephone Encounter (Signed)
Pt advised as directed below and verbalized understanding. Jocilyn Trego, CMA. 

## 2015-05-30 ENCOUNTER — Ambulatory Visit: Payer: Self-pay | Admitting: Family Medicine

## 2015-06-26 ENCOUNTER — Ambulatory Visit (INDEPENDENT_AMBULATORY_CARE_PROVIDER_SITE_OTHER): Payer: Self-pay | Admitting: Family Medicine

## 2015-06-26 VITALS — BP 132/88 | HR 72 | Temp 98.4°F | Wt 221.2 lb

## 2015-06-26 DIAGNOSIS — R05 Cough: Secondary | ICD-10-CM

## 2015-06-26 DIAGNOSIS — R059 Cough, unspecified: Secondary | ICD-10-CM

## 2015-06-26 MED ORDER — LOSARTAN POTASSIUM 50 MG PO TABS
50.0000 mg | ORAL_TABLET | Freq: Every day | ORAL | Status: DC
Start: 2015-06-26 — End: 2015-12-26

## 2015-06-26 MED ORDER — ALBUTEROL SULFATE HFA 108 (90 BASE) MCG/ACT IN AERS: 2.0000 | INHALATION_SPRAY | Freq: Four times a day (QID) | RESPIRATORY_TRACT | Status: DC | PRN

## 2015-06-26 MED ORDER — IPRATROPIUM BROMIDE 0.03 % NA SOLN: 2.0000 | Freq: Two times a day (BID) | NASAL | Status: DC

## 2015-06-26 NOTE — Progress Notes (Signed)
   Subjective:    Patient ID: [name], female    DOB: 11-Aug-1966, 49 y.o.   MRN: [id number]  Seen for Same day visit for   CC: cough  Has been coughing for 2 months. Her mother had mold in her house and patient has been there at her house cleaning.  The cough has become worse since doing this.  Intermittent production.  Having some tightness in her central chest  No cough while sleeping  Worse in the morning and at night She doesn't smoke. She is around people that smoke but they do it outside.  Having some maxillary and frontal sinus pressure  Her right ear is painful   Medications tried: robitussin.   Symptoms Runny nose: at the first sign of symptoms  Mucous in back of throat: no Throat burning or reflux: no Wheezing or asthma: sometimes  Fever: seems to have one at night  Chest Pain: sometimes after coughing  Shortness of breath: only when coughing being  Leg swelling: no Hemoptysis: no Weight loss: no  Review of Systems   See HPI for ROS. Objective:  BP 132/88 mmHg  Pulse 72  Temp(Src) 98.4 F (36.9 C) (Oral)  Wt 221 lb 3.2 oz (100.336 kg)  LMP 10/06/2013  General: NAD HEENT: TM's clear and intact, clear conjunctiva, no cervical LAD, normal turbinates b/l, frontal and maxillary sinus tenderness to palpation, no tonsillar exudates  Cardiac: RRR, normal heart sounds, no murmurs.  Respiratory: CTAB, normal effort Skin: warm and dry, no rashes noted      Assessment & Plan:   Cough Has been taking lisinopril for a few months now.  No wheezing on exam  Having some maxillary and frontal sinus tenderness but turbinates look well on exam  - changed lisinopril to losartan  - provided atrovent  - given albuterol for when her coughing spells become too severe  - advised antihistamine/decongestant OTC for a 5-7 day stretch  - if no improvement in 2 weeks, then may need to consider ABX treatment or reflux therapy.

## 2015-06-26 NOTE — Assessment & Plan Note (Signed)
Has been taking lisinopril for a few months now.  No wheezing on exam  Having some maxillary and frontal sinus tenderness but turbinates look well on exam  - changed lisinopril to losartan  - provided atrovent  - given albuterol for when her coughing spells become too severe  - advised antihistamine/decongestant OTC for a 5-7 day stretch  - if no improvement in 2 weeks, then may need to consider ABX treatment or reflux therapy.

## 2015-06-26 NOTE — Patient Instructions (Signed)
Thank you for coming in,   I have change your lisinopril to losartan.  Try getting an over-the-counter anti-histamine/decongested. (claritin-D or allegra-D)  Use the Atrovent nasal spray. Use the albuterol when her coughing fits are becoming too much.  If none of these changes help at all then please follow-up with [place] in 2-3 weeks  Sign up for My Chart to have easy access to your labs results, and communication with your Primary care physician   Please feel free to call with any questions or concerns at any time, at [phone]. --Dr. Raeford Razor

## 2015-07-31 ENCOUNTER — Ambulatory Visit: Payer: Self-pay | Admitting: Family Medicine

## 2015-08-14 ENCOUNTER — Ambulatory Visit: Payer: Self-pay | Admitting: Family Medicine

## 2015-08-23 ENCOUNTER — Ambulatory Visit (INDEPENDENT_AMBULATORY_CARE_PROVIDER_SITE_OTHER): Payer: Self-pay | Admitting: Family Medicine

## 2015-08-23 ENCOUNTER — Ambulatory Visit (HOSPITAL_COMMUNITY)
Admission: RE | Admit: 2015-08-23 | Discharge: 2015-08-23 | Disposition: A | Discharge status: Home / Self Care | Payer: Self-pay | Source: Ambulatory Visit | Attending: Family Medicine | Admitting: Family Medicine

## 2015-08-23 ENCOUNTER — Telehealth: Payer: Self-pay | Admitting: Family Medicine

## 2015-08-23 ENCOUNTER — Encounter: Payer: Self-pay | Admitting: Family Medicine

## 2015-08-23 VITALS — BP 168/102 | HR 72 | Temp 97.9°F | Wt 220.0 lb

## 2015-08-23 DIAGNOSIS — M79641 Pain in right hand: Secondary | ICD-10-CM | POA: Insufficient documentation

## 2015-08-23 DIAGNOSIS — S6991XA Unspecified injury of right wrist, hand and finger(s), initial encounter: Secondary | ICD-10-CM | POA: Insufficient documentation

## 2015-08-23 DIAGNOSIS — W230XXA Caught, crushed, jammed, or pinched between moving objects, initial encounter: Secondary | ICD-10-CM | POA: Insufficient documentation

## 2015-08-23 DIAGNOSIS — S6990XA Unspecified injury of unspecified wrist, hand and finger(s), initial encounter: Secondary | ICD-10-CM | POA: Insufficient documentation

## 2015-08-23 DIAGNOSIS — M7989 Other specified soft tissue disorders: Secondary | ICD-10-CM | POA: Insufficient documentation

## 2015-08-23 MED ORDER — TRAMADOL HCL 50 MG PO TABS: 50.0000 mg | ORAL_TABLET | Freq: Three times a day (TID) | ORAL | Status: DC | PRN

## 2015-08-23 NOTE — Telephone Encounter (Signed)
I called patient to discuss the findings from her x-rays taken today. No evidence of fractures noted on right hand x-ray. I informed her that icing and resting her hand would be the most ideal treatment at this time. She can return to work as her pain will allow. I do not feel at this time that there is any common work-related activity which would put her at risk for further injury. Patient has asked that I write a letter to her employer describing these recommendations. This will be left up front for patient to pick up at her convenience.  Cassandra Leatherwood, MD,MS,  PGY2 08/23/2015 1:45 PM

## 2015-08-23 NOTE — Patient Instructions (Signed)
It was a pleasure seeing you today in our clinic. Today we discussed your hand injury. Here is the treatment plan we have discussed and agreed upon together:   - I have ordered a x-ray of the right hand. - I will contact you after I receive the results later today. At that time it may be necessary to make a follow-up appointment. - I provided a one-time prescription for 30 tablets of tramadol. - We will discuss further plans after I review the x-rays.

## 2015-08-23 NOTE — Assessment & Plan Note (Signed)
Patient experienced a hand injury at work approximately 2 days ago. Mechanism of injury and physical exam today highly suggestive of metacarpal fracture versus soft/connective tissue injury. Neurovascularly intact. - We'll obtain complete right hand x-ray today. - 30 tablets tramadol provided today (one-time) - Encouraged Tylenol use (over ibuprofen) for additional pain due to the possibility of fracture. - Encouraged twice daily icing of the injury. - I will call patient later this afternoon once I receive the images from the x-rays ordered today.
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
## 2015-08-30 ENCOUNTER — Ambulatory Visit: Payer: Self-pay | Admitting: Family Medicine

## 2015-08-30 ENCOUNTER — Encounter: Payer: Self-pay | Admitting: Family Medicine

## 2015-08-30 ENCOUNTER — Telehealth: Payer: Self-pay | Admitting: Family Medicine

## 2015-08-30 NOTE — Telephone Encounter (Signed)
Employer still will not let patient return to work, even with le. Letter must specifically say patient can work without any restrictions. Patient states she is okay with going back without restrictions. Patient need this done asap.

## 2015-08-30 NOTE — Telephone Encounter (Signed)
Note left up front

## 2015-08-31 NOTE — Telephone Encounter (Signed)
Patient informed. 

## 2015-10-31 ENCOUNTER — Other Ambulatory Visit: Payer: Self-pay | Admitting: Family Medicine

## 2015-10-31 DIAGNOSIS — I10 Essential (primary) hypertension: Secondary | ICD-10-CM

## 2015-10-31 MED ORDER — HYDROCHLOROTHIAZIDE 25 MG PO TABS: 25.0000 mg | ORAL_TABLET | Freq: Every morning | ORAL | Status: DC

## 2015-10-31 NOTE — Telephone Encounter (Signed)
Pt needs a refill on hydrochlorothiazide (HYDRODIURIL) 25 MG tablet  Sent to [facility] Sadie Reynolds, ASA

## 2015-12-26 ENCOUNTER — Other Ambulatory Visit: Payer: Self-pay | Admitting: *Deleted

## 2015-12-26 DIAGNOSIS — R05 Cough: Secondary | ICD-10-CM

## 2015-12-26 DIAGNOSIS — R059 Cough, unspecified: Secondary | ICD-10-CM

## 2015-12-28 ENCOUNTER — Telehealth: Payer: Self-pay | Admitting: *Deleted

## 2015-12-28 NOTE — Telephone Encounter (Signed)
Patient states she has been out of losartan for 3 days, would like this to be refilled before the weekend.

## 2015-12-31 MED ORDER — LOSARTAN POTASSIUM 50 MG PO TABS: 50.0000 mg | ORAL_TABLET | Freq: Every day | ORAL | Status: DC

## 2016-01-10 ENCOUNTER — Encounter: Payer: Self-pay | Admitting: Family Medicine

## 2016-01-10 ENCOUNTER — Ambulatory Visit (INDEPENDENT_AMBULATORY_CARE_PROVIDER_SITE_OTHER): Payer: Self-pay | Admitting: Family Medicine

## 2016-01-10 ENCOUNTER — Other Ambulatory Visit (HOSPITAL_COMMUNITY)
Admission: RE | Admit: 2016-01-10 | Discharge: 2016-01-10 | Disposition: A | Discharge status: Home / Self Care | Payer: Self-pay | Source: Ambulatory Visit | Attending: Family Medicine | Admitting: Family Medicine

## 2016-01-10 VITALS — BP 130/90 | HR 89 | Temp 98.1°F | Wt 227.0 lb

## 2016-01-10 DIAGNOSIS — Z113 Encounter for screening for infections with a predominantly sexual mode of transmission: Secondary | ICD-10-CM | POA: Insufficient documentation

## 2016-01-10 DIAGNOSIS — N898 Other specified noninflammatory disorders of vagina: Secondary | ICD-10-CM

## 2016-01-10 DIAGNOSIS — N719 Inflammatory disease of uterus, unspecified: Secondary | ICD-10-CM

## 2016-01-10 LAB — POCT WET PREP (WET MOUNT): Clue Cells Wet Prep Whiff POC: POSITIVE

## 2016-01-10 MED ORDER — METRONIDAZOLE 500 MG PO TABS: 1000.0000 mg | ORAL_TABLET | Freq: Two times a day (BID) | ORAL | Status: DC

## 2016-01-10 MED ORDER — DOXYCYCLINE HYCLATE 100 MG PO CAPS: 100.0000 mg | ORAL_CAPSULE | Freq: Two times a day (BID) | ORAL | Status: DC

## 2016-01-10 NOTE — Progress Notes (Signed)
    Subjective:    Patient ID: [name] is a 50 y.o. female presenting with Vaginal Discharge  on 01/10/2016  HPI: Reports 3 wk h/o lower abdominal pain and vaginal discharge. Feels swollen and reports back pain.  Yellow in nature and irritating her vulva. H/o STD in the past and treated but with the same partner.  Review of Systems  Constitutional: Positive for fever (questionable). Negative for chills.  Respiratory: Negative for shortness of breath.   Cardiovascular: Negative for chest pain.  Gastrointestinal: Negative for nausea, vomiting and abdominal pain.  Genitourinary: Negative for dysuria.  Skin: Negative for rash.      Objective:    BP 130/90 mmHg  Pulse 89  Temp(Src) 98.1 F (36.7 C) (Oral)  Wt 227 lb (102.967 kg)  LMP 10/06/2013 Physical Exam  Constitutional: She is oriented to person, place, and time. She appears well-developed and well-nourished. No distress.  HENT:  Head: Normocephalic and atraumatic.  Eyes: No scleral icterus.  Neck: Neck supple.  Cardiovascular: Normal rate.   Pulmonary/Chest: Effort normal.  Abdominal: Soft.  Genitourinary: Vaginal discharge (yellow) found.  Diffuse uterine tenderness, no adnexal mass or tenderness  Neurological: She is alert and oriented to person, place, and time.  Skin: Skin is warm and dry.  Psychiatric: She has a normal mood and affect.        Assessment & Plan:  1. Vaginal discharge + for trich on wet mount - POCT Wet Prep [facility]) - Cervicovaginal ancillary only - metroNIDAZOLE (FLAGYL) 500 MG tablet; Take 2 tablets (1,000 mg total) by mouth 2 (two) times daily.  Dispense: 4 tablet; Refill: 0  2. Endometritis Treat presumptively - doxycycline (VIBRAMYCIN) 100 MG capsule; Take 1 capsule (100 mg total) by mouth 2 (two) times daily.  Dispense: 20 capsule; Refill: 0   Return in about 3 months (around 04/10/2016).  Kashius Dominic S 01/10/2016 4:45 PM

## 2016-01-10 NOTE — Patient Instructions (Signed)

## 2016-01-11 ENCOUNTER — Telehealth: Payer: Self-pay

## 2016-01-11 LAB — CERVICOVAGINAL ANCILLARY ONLY
Chlamydia: NEGATIVE
Neisseria Gonorrhea: NEGATIVE

## 2016-01-11 NOTE — Telephone Encounter (Signed)
Tried calling pt, unable to reach or leave a message for pt to return call. Will try again later.Dorna Bloom

## 2016-01-11 NOTE — Telephone Encounter (Signed)
-----   Message from Donnamae Jude, MD sent at 01/10/2016  5:14 PM EDT ----- Positive for trich--I sent in abx--needs to have partner treated

## 2016-01-14 NOTE — Telephone Encounter (Signed)
Patient is aware of results. Demetra Moya,CMA  

## 2016-01-14 NOTE — Telephone Encounter (Signed)
LM for patient to call back. Jazmin Hartsell,CMA  

## 2016-01-14 NOTE — Telephone Encounter (Signed)
-----   Message from Donnamae Jude, MD sent at 01/14/2016  8:08 AM EDT ----- Other cultures are negative.

## 2016-01-17 ENCOUNTER — Telehealth: Payer: Self-pay | Admitting: Family Medicine

## 2016-01-30 ENCOUNTER — Encounter: Payer: Self-pay | Admitting: Family Medicine

## 2016-01-30 ENCOUNTER — Ambulatory Visit (INDEPENDENT_AMBULATORY_CARE_PROVIDER_SITE_OTHER): Payer: Self-pay | Admitting: Family Medicine

## 2016-01-30 VITALS — BP 132/84 | HR 76 | Temp 98.3°F | Ht 68.0 in | Wt 228.0 lb

## 2016-01-30 DIAGNOSIS — F411 Generalized anxiety disorder: Secondary | ICD-10-CM

## 2016-01-30 DIAGNOSIS — I10 Essential (primary) hypertension: Secondary | ICD-10-CM

## 2016-01-30 DIAGNOSIS — R05 Cough: Secondary | ICD-10-CM

## 2016-01-30 DIAGNOSIS — R059 Cough, unspecified: Secondary | ICD-10-CM

## 2016-01-30 MED ORDER — BUSPIRONE HCL 7.5 MG PO TABS: 7.5000 mg | ORAL_TABLET | Freq: Two times a day (BID) | ORAL | Status: DC

## 2016-01-30 MED ORDER — LOSARTAN POTASSIUM 100 MG PO TABS: 100.0000 mg | ORAL_TABLET | Freq: Every day | ORAL | Status: DC

## 2016-01-30 NOTE — Patient Instructions (Addendum)
Good to see you today!  Thanks for coming in.  For your cough - keep taking what you are and use Clariten every day  Come back in 1 month if still coughing may need PFTs  Check your blood pressure regularly - bring in the readings and your machine   Take Losartan 100 mg daily  Come back in 3-5 days for a blood check  See your regular doctor in 3-4 weeks

## 2016-01-31 NOTE — Assessment & Plan Note (Signed)
Worsened.  Likely due to recent bronchitis.  Recheck in a few weeks.  If persists needs more work up likely PFTs   No red flags for acute condition (pna or CHF)

## 2016-01-31 NOTE — Progress Notes (Signed)
   Subjective:    Patient ID: [name], female    DOB: 1966-08-01, 50 y.o.   MRN: [id number]  HPI  Cough  Has had problems with coughing and and off for months.  Believes started when on lisinopril got some better when she changed to losartan but not back to normal.  Two weeks ago had fever and sputum and was seen at [facility] ER.  Cxr negative by her report.  Told severe bronchitis and treated with albuterol and antibiotics.  Her fever and sputum has resolved by still has cough.  Non productive no shortness of breath or rashes   HYPERTENSION Disease Monitoring Home BP Monitoring (Severity) has been high on and off.  Clonidine seemed to help the most Symptoms - Chest pain- no    Dyspnea- no - see cough above Medications(Modifying factors) Compliance-  Took hctz and losartan this am. Lightheadedness-  no  Edema- no Timing - continuous  Duration - years ROS - See HPI  PMH Lab Review   POTASSIUM  Date Value Ref Range Status  12/19/2014 3.2* 3.5 - 5.1 mmol/L Final   SODIUM  Date Value Ref Range Status  12/19/2014 138 135 - 145 mmol/L Final   CREAT  Date Value Ref Range Status  09/08/2014 0.90 0.50 - 1.10 mg/dL Final   CREATININE, SER  Date Value Ref Range Status  12/19/2014 0.81 0.50 - 1.10 mg/dL Final      Chief Complaint noted Review of Symptoms - see HPI PMH - Smoking status noted.   Vital Signs reviewed    Review of Systems     Objective:   Physical Exam  Alert nad Lungs:  Normal respiratory effort, chest expands symmetrically. Lungs are clear to auscultation, no crackles or wheezes. Heart - Regular rate and rhythm.  No murmurs, gallops or rubs.    Extremities:  No cyanosis, edema, or deformity noted with good range of motion of all major joints.   Did not cough while I was in the room Mouth - no lesions, mucous membranes are moist, no decaying teeth  Ears:  External ear exam shows no significant lesions or deformities.  Otoscopic examination reveals  clear canals, tympanic membranes are intact bilaterally without bulging, retraction, inflammation or discharge. Hearing is grossly normal bilaterall        Assessment & Plan:

## 2016-01-31 NOTE — Assessment & Plan Note (Addendum)
Not at goal initially and by report has been high recently other places.  She would have like to use clonidiine since helped with her nerves but given lack of evidence we agreed to increase losartan.  Get follow up blood test

## 2016-02-20 ENCOUNTER — Telehealth: Payer: Self-pay | Admitting: Family Medicine

## 2016-02-20 ENCOUNTER — Other Ambulatory Visit: Payer: Self-pay | Admitting: Family Medicine

## 2016-02-20 DIAGNOSIS — I10 Essential (primary) hypertension: Secondary | ICD-10-CM

## 2016-02-20 MED ORDER — HYDROCHLOROTHIAZIDE 25 MG PO TABS: 25.0000 mg | ORAL_TABLET | Freq: Every morning | ORAL | Status: DC

## 2016-02-20 NOTE — Telephone Encounter (Signed)
Pt is calling for a refill on her Hydrochlorothiazide to be called in. jw

## 2016-02-26 ENCOUNTER — Ambulatory Visit: Payer: Self-pay | Admitting: Family Medicine

## 2016-02-26 ENCOUNTER — Other Ambulatory Visit: Payer: Self-pay | Admitting: Family Medicine

## 2016-02-27 ENCOUNTER — Ambulatory Visit: Payer: Self-pay | Admitting: Family Medicine

## 2016-03-24 ENCOUNTER — Ambulatory Visit: Payer: Self-pay | Admitting: Family Medicine

## 2016-03-26 ENCOUNTER — Ambulatory Visit: Payer: Self-pay | Admitting: Family Medicine

## 2016-04-03 ENCOUNTER — Encounter: Payer: Self-pay | Admitting: Family Medicine

## 2016-04-03 ENCOUNTER — Ambulatory Visit (INDEPENDENT_AMBULATORY_CARE_PROVIDER_SITE_OTHER): Payer: Self-pay | Admitting: Family Medicine

## 2016-04-03 VITALS — BP 136/76 | HR 78 | Temp 98.3°F | Ht 68.0 in | Wt 217.8 lb

## 2016-04-03 DIAGNOSIS — I10 Essential (primary) hypertension: Secondary | ICD-10-CM

## 2016-04-03 DIAGNOSIS — R195 Other fecal abnormalities: Secondary | ICD-10-CM

## 2016-04-03 DIAGNOSIS — R1031 Right lower quadrant pain: Secondary | ICD-10-CM

## 2016-04-03 NOTE — Assessment & Plan Note (Signed)
Well-controlled Continue current medications Follow-up in 3 months with PCP

## 2016-04-03 NOTE — Assessment & Plan Note (Addendum)
No fevers, rebound, and ongoing for 2 long to be appendicitis Could be related to constipation as this is been an issue in the past, but she is now having soft formed stools once daily per report No mass palpable on exam, but concerning given decreased stool caliber No weight loss that was unintentional No pelvic pain that would be concerning for ovarian pathology No urinary symptoms or CVA tenderness Refer to gastroenterology for colonoscopy

## 2016-04-03 NOTE — Progress Notes (Signed)
   Subjective:   [name] is a 50 y.o. female with a history of HTN, anxiety, seasonal allergies, obesity here for Right lower quadrant pain and hypertension follow-up  HTN: - Medications: HCTZ 25mg  daily, losartan 100mg  daily - Compliance: good  - Checking BP at home: no - Denies any SOB, CP, vision changes, LE edema, medication SEs, or symptoms of hypotension  Pain in R side - RLQ - h/o ovarian cyst in 2015 - has been chronically intermittent for several years - bladder feels heavy - radiates from RLQ to low back - sometimes swells - difficult to lay on that side - stabbing pain and pressure - feels like it is pressing into back - pain has been in current state for ~6 months - constant now - intermittent constipation - taking fiber didn't change pain - has BM daily - caliber has decreased, soft formed stool - no BRBPR or melena - 10lb weight loss - trying to do this - nothing seems to help - worse when needs to have BM - no fevers, N/V. Dysuria, change in vaginal discharge currently - does not change based on dietary intake  Review of Systems:  Per HPI.   Social History: never smoker  Objective:  BP 136/76 mmHg  Pulse 78  Temp(Src) 98.3 F (36.8 C) (Oral)  Ht 5\' 8"  (1.727 m)  Wt 217 lb 12.8 oz (98.793 kg)  BMI 33.12 kg/m2  LMP 10/06/2013  Gen:  50 y.o. female in NAD HEENT: NCAT, MMM, EOMI, PERRL, anicteric sclerae CV: RRR, no MRG Resp: Non-labored, CTAB, no wheezes noted Abd: Soft, ND, TTP in RLQ, BS present, no guarding/rebound or organomegaly, no CVAT Ext: WWP, no edema MSK: No obvious deformities. Neuro: Alert and oriented, speech normal      Chemistry      Component Value Date/Time   NA 138 12/19/2014 1636   K 3.2* 12/19/2014 1636   CL 103 12/19/2014 1636   CO2 25 12/19/2014 1636   BUN 15 12/19/2014 1636   CREATININE 0.81 12/19/2014 1636   CREATININE 0.90 09/08/2014 1129      Component Value Date/Time   CALCIUM 9.7 12/19/2014 1636     ALKPHOS 73 12/19/2014 1636   AST 14 12/19/2014 1636   ALT 17 12/19/2014 1636   BILITOT 0.3 12/19/2014 1636      Lab Results  Component Value Date   WBC 8.2 12/19/2014   HGB 11.9* 12/19/2014   HCT 36.6 12/19/2014   MCV 86.5 12/19/2014   PLT 312 12/19/2014    Assessment & Plan:     [name] is a 51 y.o. female here for   Essential hypertension, benign Well-controlled Continue current medications Follow-up in 3 months with PCP  RLQ abdominal pain No fevers, rebound, and ongoing for 2 long to be appendicitis Could be related to constipation as this is been an issue in the past, but she is now having soft formed stools once daily per report No mass palpable on exam, but concerning given decreased stool caliber No weight loss that was unintentional No pelvic pain that would be concerning for ovarian pathology No urinary symptoms or CVA tenderness Refer to gastroenterology for colonoscopy  Decreased stool caliber As above Refer to gastroenterology for colonoscopy       Virginia Crews, MD MPH PGY-3,  [facility] 04/03/2016  4:57 PM

## 2016-04-03 NOTE — Assessment & Plan Note (Signed)
As above Refer to gastroenterology for colonoscopy

## 2016-04-03 NOTE — Patient Instructions (Signed)
Nice to meet you today. Your blood pressure is good. Continue taking current medications. I put in a referral to gastroenterology for colonoscopy to further evaluate your abdominal pain and decreased stool caliber. You should hear about this within the next couple of weeks. If you do not give [place] a call back.  Take care, Dr. B  Abdominal Pain, Adult Many things can cause abdominal pain. Usually, abdominal pain is not caused by a disease and will improve without treatment. It can often be observed and treated at home. Your health care provider will do a physical exam and possibly order blood tests and X-rays to help determine the seriousness of your pain. However, in many cases, more time must pass before a clear cause of the pain can be found. Before that point, your health care provider may not know if you need more testing or further treatment. HOME CARE INSTRUCTIONS Monitor your abdominal pain for any changes. The following actions may help to alleviate any discomfort you are experiencing:  Only take over-the-counter or prescription medicines as directed by your health care provider.  Do not take laxatives unless directed to do so by your health care provider.  Try a clear liquid diet (broth, tea, or water) as directed by your health care provider. Slowly move to a bland diet as tolerated. SEEK MEDICAL CARE IF:  You have unexplained abdominal pain.  You have abdominal pain associated with nausea or diarrhea.  You have pain when you urinate or have a bowel movement.  You experience abdominal pain that wakes you in the night.  You have abdominal pain that is worsened or improved by eating food.  You have abdominal pain that is worsened with eating fatty foods.  You have a fever. SEEK IMMEDIATE MEDICAL CARE IF:  Your pain does not go away within 2 hours.  You keep throwing up (vomiting).  Your pain is felt only in portions of the abdomen, such as the right side or the left lower  portion of the abdomen.  You pass bloody or black tarry stools. MAKE SURE YOU:  Understand these instructions.  Will watch your condition.  Will get help right away if you are not doing well or get worse.   This information is not intended to replace advice given to you by your health care provider. Make sure you discuss any questions you have with your health care provider.   Document Released: 06/18/2005 Document Revised: 05/30/2015 Document Reviewed: 05/18/2013 Elsevier Interactive Patient Education [employer].

## 2016-04-23 ENCOUNTER — Other Ambulatory Visit: Payer: Self-pay | Admitting: *Deleted

## 2016-04-23 NOTE — Telephone Encounter (Signed)
Will forward to MD to advise. Jazmin Hartsell,CMA  

## 2016-04-23 NOTE — Telephone Encounter (Signed)
Patient states that at her last visit Dr. B had prescribed her some flagyl and she is still having the same symptoms and wants to know if she can get a refill. 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 Requests message be sent to PCP and/or Dr. Brita Romp to see if medication can be refilled this one time. [place]

## 2016-04-23 NOTE — Telephone Encounter (Signed)
Covering inbox for Dr. Brett Albino while she is away. Do not see any documentation of flagyl prescribed, will need to run this by Dr. Brita Romp. If patient has new concerns she will need to schedule an appointment at the clinic. Please call patient and inform her of this.

## 2016-04-24 NOTE — Telephone Encounter (Signed)
It doesn't look like we discussed vaginal discharge at all or that I prescribed flagyl at last visit.  I think patient should have SDA or other appt to eval vag discharge.  Final decision up to you though.  Virginia Crews, MD, MPH PGY-3,  [facility] 04/24/2016 12:24 PM

## 2016-05-02 ENCOUNTER — Other Ambulatory Visit: Payer: Self-pay | Admitting: Internal Medicine

## 2016-05-02 MED ORDER — METRONIDAZOLE 500 MG PO TABS: 1000.0000 mg | ORAL_TABLET | Freq: Two times a day (BID) | ORAL | 0 refills | Status: DC

## 2016-05-02 NOTE — Telephone Encounter (Signed)
Pt is calling again about having Flagyl called in. She had the same symptoms in April and would like to have this called in again. jw

## 2016-05-02 NOTE — Telephone Encounter (Signed)
Please let [name] know that I have sent in a refill of Flagyl into her pharmacy. Generally, we cannot prescribe these types of medications over the phone without seeing patients in clinic. If she continues to have vaginal discharge, she will need to come in to be seen. Thank you!

## 2016-05-02 NOTE — Telephone Encounter (Signed)
Pt informed

## 2016-05-02 NOTE — Telephone Encounter (Signed)
Pt called again wanting Flagyl or something for itching and a little bit of discharge. Pt states she can not get off work. We got disconnected or she hung on me, pt was frustrated. Please advise. Thanks! ep

## 2016-05-02 NOTE — Progress Notes (Signed)
Pt calling in to request Flagyl for vaginal discharge, which is the same as the discharge she had back in April when she was diagnosed with Trichomonas. Pt states she cannot get off work to come in for an appointment. Would ideally like to see her to repeat wet prep to see if she has a recurrence of trichomonas vs yeast or BV. Because it is the weekend, will send a prescription for Flagyl into her pharmacy. If she continues to have vaginal discharge, she will need to be seen.  Hyman Bible, MD PGY-2

## 2016-05-24 ENCOUNTER — Other Ambulatory Visit: Payer: Self-pay | Admitting: Family Medicine

## 2016-05-24 DIAGNOSIS — I10 Essential (primary) hypertension: Secondary | ICD-10-CM

## 2016-05-27 ENCOUNTER — Other Ambulatory Visit: Payer: Self-pay | Admitting: Internal Medicine

## 2016-05-27 ENCOUNTER — Ambulatory Visit: Payer: Self-pay | Admitting: Internal Medicine

## 2016-05-27 DIAGNOSIS — I10 Essential (primary) hypertension: Secondary | ICD-10-CM

## 2016-05-27 MED ORDER — HYDROCHLOROTHIAZIDE 25 MG PO TABS: ORAL_TABLET | ORAL | 1 refills | Status: DC

## 2016-05-27 NOTE — Telephone Encounter (Signed)
Medication refilled

## 2016-05-27 NOTE — Telephone Encounter (Signed)
Please let Ms. Andrzejewski know that I have sent in a refill for her HCTZ. Thank you!

## 2016-05-27 NOTE — Telephone Encounter (Signed)
Patient requests refill for hydrocortrozide 25 mg asap. Please, follow up.

## 2016-07-31 ENCOUNTER — Emergency Department (HOSPITAL_COMMUNITY): Payer: BLUE CROSS/BLUE SHIELD

## 2016-07-31 ENCOUNTER — Emergency Department (HOSPITAL_COMMUNITY)
Admission: EM | Admit: 2016-07-31 | Discharge: 2016-07-31 | Disposition: A | Discharge status: Home / Self Care | Payer: BLUE CROSS/BLUE SHIELD | Attending: Emergency Medicine | Admitting: Emergency Medicine

## 2016-07-31 ENCOUNTER — Encounter (HOSPITAL_COMMUNITY): Payer: Self-pay | Admitting: Emergency Medicine

## 2016-07-31 DIAGNOSIS — R1084 Generalized abdominal pain: Secondary | ICD-10-CM | POA: Diagnosis not present

## 2016-07-31 DIAGNOSIS — Z79899 Other long term (current) drug therapy: Secondary | ICD-10-CM | POA: Diagnosis not present

## 2016-07-31 DIAGNOSIS — R1031 Right lower quadrant pain: Secondary | ICD-10-CM | POA: Diagnosis present

## 2016-07-31 DIAGNOSIS — I10 Essential (primary) hypertension: Secondary | ICD-10-CM | POA: Diagnosis not present

## 2016-07-31 DIAGNOSIS — Z91040 Latex allergy status: Secondary | ICD-10-CM | POA: Diagnosis not present

## 2016-07-31 DIAGNOSIS — Z7982 Long term (current) use of aspirin: Secondary | ICD-10-CM | POA: Diagnosis not present

## 2016-07-31 LAB — I-STAT CHEM 8, ED
BUN: 14 mg/dL (ref 6–20)
CALCIUM ION: 1.22 mmol/L (ref 1.15–1.40)
CREATININE: 0.9 mg/dL (ref 0.44–1.00)
Chloride: 106 mmol/L (ref 101–111)
GLUCOSE: 112 mg/dL — AB (ref 65–99)
HCT: 39 % (ref 36.0–46.0)
HEMOGLOBIN: 13.3 g/dL (ref 12.0–15.0)
Potassium: 3.6 mmol/L (ref 3.5–5.1)
Sodium: 142 mmol/L (ref 135–145)
TCO2: 23 mmol/L (ref 0–100)

## 2016-07-31 LAB — URINALYSIS, ROUTINE W REFLEX MICROSCOPIC
BILIRUBIN URINE: NEGATIVE
GLUCOSE, UA: NEGATIVE mg/dL
Hgb urine dipstick: NEGATIVE
KETONES UR: NEGATIVE mg/dL
Leukocytes, UA: NEGATIVE
NITRITE: NEGATIVE
PH: 5.5 (ref 5.0–8.0)
Protein, ur: NEGATIVE mg/dL
Specific Gravity, Urine: 1.019 (ref 1.005–1.030)

## 2016-07-31 LAB — COMPREHENSIVE METABOLIC PANEL
ALBUMIN: 3.8 g/dL (ref 3.5–5.0)
ALK PHOS: 82 U/L (ref 38–126)
ALT: 20 U/L (ref 14–54)
ANION GAP: 10 (ref 5–15)
AST: 22 U/L (ref 15–41)
BILIRUBIN TOTAL: 0.7 mg/dL (ref 0.3–1.2)
BUN: 13 mg/dL (ref 6–20)
CALCIUM: 9.7 mg/dL (ref 8.9–10.3)
CO2: 21 mmol/L — ABNORMAL LOW (ref 22–32)
Chloride: 107 mmol/L (ref 101–111)
Creatinine, Ser: 0.91 mg/dL (ref 0.44–1.00)
GFR calc Af Amer: >60 mL/min (ref >60)
GFR calc non Af Amer: >60 mL/min (ref >60)
GLUCOSE: 113 mg/dL — AB (ref 65–99)
Potassium: 3.6 mmol/L (ref 3.5–5.1)
Sodium: 138 mmol/L (ref 135–145)
TOTAL PROTEIN: 6.7 g/dL (ref 6.5–8.1)

## 2016-07-31 LAB — CBC WITH DIFFERENTIAL/PLATELET
BASOS PCT: 1 %
Basophils Absolute: 0.1 10*3/uL (ref 0.0–0.1)
Eosinophils Absolute: 0.2 10*3/uL (ref 0.0–0.7)
Eosinophils Relative: 3 %
HEMATOCRIT: 39.2 % (ref 36.0–46.0)
HEMOGLOBIN: 12.9 g/dL (ref 12.0–15.0)
LYMPHS ABS: 1.6 10*3/uL (ref 0.7–4.0)
LYMPHS PCT: 24 %
MCH: 27.9 pg (ref 26.0–34.0)
MCHC: 32.9 g/dL (ref 30.0–36.0)
MCV: 84.8 fL (ref 78.0–100.0)
MONOS PCT: 7 %
Monocytes Absolute: 0.5 10*3/uL (ref 0.1–1.0)
NEUTROS ABS: 4.2 10*3/uL (ref 1.7–7.7)
NEUTROS PCT: 65 %
Platelets: 333 10*3/uL (ref 150–400)
RBC: 4.62 MIL/uL (ref 3.87–5.11)
RDW: 13.5 % (ref 11.5–15.5)
WBC: 6.5 10*3/uL (ref 4.0–10.5)

## 2016-07-31 LAB — LIPASE, BLOOD: Lipase: 22 U/L (ref 11–51)

## 2016-07-31 LAB — POC URINE PREG, ED: Preg Test, Ur: NEGATIVE

## 2016-07-31 MED ORDER — IOPAMIDOL (ISOVUE-300) INJECTION 61%
INTRAVENOUS | Status: AC
Start: 2016-07-31 — End: 2016-07-31
  Administered 2016-07-31: 100 mL via INTRAVENOUS
  Filled 2016-07-31: qty 100

## 2016-07-31 MED ORDER — ONDANSETRON HCL 4 MG/2ML IJ SOLN
4.0000 mg | Freq: Once | INTRAMUSCULAR | Status: AC
  Administered 2016-07-31: 4 mg via INTRAVENOUS
  Filled 2016-07-31: qty 2

## 2016-07-31 MED ORDER — FENTANYL CITRATE (PF) 100 MCG/2ML IJ SOLN
25.0000 ug | Freq: Once | INTRAMUSCULAR | Status: AC
  Administered 2016-07-31: 25 ug via INTRAVENOUS
  Filled 2016-07-31: qty 2

## 2016-07-31 NOTE — ED Notes (Signed)
ED Provider at bedside. 

## 2016-07-31 NOTE — ED Notes (Signed)
Pt reports nausea, vomiting and diarrhea Monday, since antibiotics has only experienced nausea and decreased appetite as a result.

## 2016-07-31 NOTE — ED Notes (Signed)
Brought patient back to room via wheelchair; patient getting undressed and in to a gown at this time; 3 warm blankets given; Danelle Berry, RN aware

## 2016-07-31 NOTE — ED Notes (Signed)
Pt. given note for work and verbalized understanding of need to follow up with PCP. NAD. Ambulatory at discharge.

## 2016-07-31 NOTE — ED Notes (Signed)
Patient transported to CT 

## 2016-07-31 NOTE — ED Triage Notes (Signed)
Pt to ER by private vehicle for further evaluation of right and left lower abdominal pain after being diagnosed with UTI on Monday. Reports symptoms have not gotten any better since being on Cipro, was re-evaluated yesterday by family medicine and was told to come in this morning at 9 am for an [place] of abdomen and CT however patient reports pain is worse than she can bear until that time. Pt is a/o x4. VSS.
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
## 2016-11-25 ENCOUNTER — Other Ambulatory Visit (HOSPITAL_COMMUNITY)
Admission: RE | Admit: 2016-11-25 | Discharge: 2016-11-25 | Disposition: A | Discharge status: Home / Self Care | Payer: BLUE CROSS/BLUE SHIELD | Source: Ambulatory Visit | Attending: Family Medicine | Admitting: Family Medicine

## 2016-11-25 ENCOUNTER — Encounter: Payer: Self-pay | Admitting: Family Medicine

## 2016-11-25 ENCOUNTER — Ambulatory Visit (INDEPENDENT_AMBULATORY_CARE_PROVIDER_SITE_OTHER): Payer: BLUE CROSS/BLUE SHIELD | Admitting: Family Medicine

## 2016-11-25 VITALS — BP 138/88 | HR 72 | Temp 98.6°F | Ht 68.0 in | Wt 221.0 lb

## 2016-11-25 DIAGNOSIS — N898 Other specified noninflammatory disorders of vagina: Secondary | ICD-10-CM | POA: Diagnosis not present

## 2016-11-25 DIAGNOSIS — Z113 Encounter for screening for infections with a predominantly sexual mode of transmission: Secondary | ICD-10-CM | POA: Diagnosis present

## 2016-11-25 DIAGNOSIS — L298 Other pruritus: Secondary | ICD-10-CM

## 2016-11-25 LAB — POCT WET PREP (WET MOUNT): CLUE CELLS WET PREP WHIFF POC: POSITIVE

## 2016-11-25 MED ORDER — METRONIDAZOLE 500 MG PO TABS: 500.0000 mg | ORAL_TABLET | Freq: Two times a day (BID) | ORAL | 0 refills | Status: AC

## 2016-11-25 NOTE — Patient Instructions (Signed)

## 2016-11-25 NOTE — Progress Notes (Signed)
    Subjective:  [name] is a 51 y.o. female who presents to the [facility] today with a chief complaint of vaginal discharge.   HPI:  Vaginal Discharge Symptoms started this morning. Patient had intercourse about 5 days ago. A condom was used, however patient reports that it fell off during intercourse. This morning patient noticed thick yellow discharge that had a foul smelling odor. She has also noticed lower abdominal pain over the past few days, but is currently being treated for a UTI with cipro. Endorses mild subjective fevers. No medications tried.   ROS: Per HPI  PMH: Smoking history reviewed.   Objective:  Physical Exam: BP 138/88   Pulse 72   Temp 98.6 F (37 C) (Oral)   Ht 5\' 8"  (1.727 m)   Wt 221 lb (100.2 kg)   LMP 10/06/2013   SpO2 98%   BMI 33.60 kg/m   Gen: NAD, resting comfortably CV: RRR with no murmurs appreciated Pulm: NWOB, CTAB with no crackles, wheezes, or rhonchi GI: Obese,Normal bowel sounds present. Soft, Nondistended. Tender to palpation along suprapubic area.  GU: Normal external female genitalia. Scant amount of thin white discharge noted in vaginal vault. Mild cervical motion tenderness noted.  MSK: no edema, cyanosis, or clubbing noted Skin: warm, dry Neuro: grossly normal, moves all extremities Psych: Normal affect and thought content  Results for orders placed or performed in visit on 11/25/16 (from the past 72 hour(s))  POCT Wet Prep [facility] [place])     Status: Abnormal   Collection Time: 11/25/16  2:00 PM  Result Value Ref Range   Source Wet Prep POC VAG    WBC, Wet Prep HPF POC 1-5    Bacteria Wet Prep HPF POC Many (A) Few   Clue Cells Wet Prep HPF POC Moderate (A) None   Clue Cells Wet Prep Whiff POC Positive Whiff    Yeast Wet Prep HPF POC None    Trichomonas Wet Prep HPF POC Present (A) Absent    Assessment/Plan:  Vaginal Discharge Wet prep consistent with trichomoniasis and BV. Will treat with flagyl. Instructed patient to  discuss with partner. GC/CT sent. HIV/RPR sent. Return precautions reviewed. Follow up as needed.   Algis Greenhouse. Jerline Pain, [facility] Resident PGY-3 11/25/2016 3:12 PM

## 2016-11-26 ENCOUNTER — Encounter: Payer: Self-pay | Admitting: Family Medicine

## 2016-11-26 LAB — CERVICOVAGINAL ANCILLARY ONLY
Chlamydia: NEGATIVE
Neisseria Gonorrhea: NEGATIVE

## 2016-11-26 LAB — HIV ANTIBODY (ROUTINE TESTING W REFLEX): HIV 1&2 Ab, 4th Generation: NONREACTIVE

## 2016-11-26 LAB — RPR

## 2016-12-29 ENCOUNTER — Encounter (HOSPITAL_COMMUNITY): Payer: Self-pay | Admitting: Emergency Medicine

## 2016-12-29 ENCOUNTER — Emergency Department (HOSPITAL_COMMUNITY): Payer: No Typology Code available for payment source

## 2016-12-29 ENCOUNTER — Emergency Department (HOSPITAL_COMMUNITY)
Admission: EM | Admit: 2016-12-29 | Discharge: 2016-12-29 | Disposition: A | Discharge status: Home / Self Care | Payer: No Typology Code available for payment source | Attending: Emergency Medicine | Admitting: Emergency Medicine

## 2016-12-29 DIAGNOSIS — S20211A Contusion of right front wall of thorax, initial encounter: Secondary | ICD-10-CM | POA: Diagnosis not present

## 2016-12-29 DIAGNOSIS — Y92410 Unspecified street and highway as the place of occurrence of the external cause: Secondary | ICD-10-CM | POA: Insufficient documentation

## 2016-12-29 DIAGNOSIS — I10 Essential (primary) hypertension: Secondary | ICD-10-CM | POA: Diagnosis not present

## 2016-12-29 DIAGNOSIS — Z7982 Long term (current) use of aspirin: Secondary | ICD-10-CM | POA: Diagnosis not present

## 2016-12-29 DIAGNOSIS — Y999 Unspecified external cause status: Secondary | ICD-10-CM | POA: Diagnosis not present

## 2016-12-29 DIAGNOSIS — Y939 Activity, unspecified: Secondary | ICD-10-CM | POA: Insufficient documentation

## 2016-12-29 DIAGNOSIS — Z91040 Latex allergy status: Secondary | ICD-10-CM | POA: Insufficient documentation

## 2016-12-29 DIAGNOSIS — R079 Chest pain, unspecified: Secondary | ICD-10-CM

## 2016-12-29 DIAGNOSIS — S299XXA Unspecified injury of thorax, initial encounter: Secondary | ICD-10-CM | POA: Diagnosis present

## 2016-12-29 LAB — COMPREHENSIVE METABOLIC PANEL
ALT: 22 U/L (ref 14–54)
AST: 21 U/L (ref 15–41)
Albumin: 3.8 g/dL (ref 3.5–5.0)
Alkaline Phosphatase: 70 U/L (ref 38–126)
Anion gap: 7 (ref 5–15)
BUN: 11 mg/dL (ref 6–20)
CO2: 26 mmol/L (ref 22–32)
Calcium: 9.8 mg/dL (ref 8.9–10.3)
Chloride: 105 mmol/L (ref 101–111)
Creatinine, Ser: 0.72 mg/dL (ref 0.44–1.00)
GFR calc Af Amer: >60 mL/min (ref >60)
GFR calc non Af Amer: >60 mL/min (ref >60)
Glucose, Bld: 116 mg/dL — ABNORMAL HIGH (ref 65–99)
Potassium: 3.7 mmol/L (ref 3.5–5.1)
Sodium: 138 mmol/L (ref 135–145)
Total Bilirubin: 0.7 mg/dL (ref 0.3–1.2)
Total Protein: 6.6 g/dL (ref 6.5–8.1)

## 2016-12-29 LAB — CBC WITH DIFFERENTIAL/PLATELET
Basophils Absolute: 0 10*3/uL (ref 0.0–0.1)
Basophils Relative: 0 %
Eosinophils Absolute: 0.1 10*3/uL (ref 0.0–0.7)
Eosinophils Relative: 1 %
HCT: 38.7 % (ref 36.0–46.0)
Hemoglobin: 12.4 g/dL (ref 12.0–15.0)
Lymphocytes Relative: 23 %
Lymphs Abs: 1.7 10*3/uL (ref 0.7–4.0)
MCH: 27.9 pg (ref 26.0–34.0)
MCHC: 32 g/dL (ref 30.0–36.0)
MCV: 87 fL (ref 78.0–100.0)
Monocytes Absolute: 0.4 10*3/uL (ref 0.1–1.0)
Monocytes Relative: 5 %
Neutro Abs: 5.2 10*3/uL (ref 1.7–7.7)
Neutrophils Relative %: 71 %
Platelets: 305 10*3/uL (ref 150–400)
RBC: 4.45 MIL/uL (ref 3.87–5.11)
RDW: 13.6 % (ref 11.5–15.5)
WBC: 7.3 10*3/uL (ref 4.0–10.5)

## 2016-12-29 LAB — I-STAT TROPONIN, ED: Troponin i, poc: 0 ng/mL (ref 0.00–0.08)

## 2016-12-29 MED ORDER — KETOROLAC TROMETHAMINE 15 MG/ML IJ SOLN
15.0000 mg | Freq: Once | INTRAMUSCULAR | Status: AC
  Administered 2016-12-29: 15 mg via INTRAVENOUS
  Filled 2016-12-29: qty 1

## 2016-12-29 MED ORDER — ONDANSETRON HCL 4 MG/2ML IJ SOLN
4.0000 mg | Freq: Once | INTRAMUSCULAR | Status: AC
  Administered 2016-12-29: 4 mg via INTRAVENOUS
  Filled 2016-12-29: qty 2

## 2016-12-29 MED ORDER — HYDROMORPHONE HCL 1 MG/ML IJ SOLN
1.0000 mg | Freq: Once | INTRAMUSCULAR | Status: AC
  Administered 2016-12-29: 1 mg via INTRAVENOUS
  Filled 2016-12-29: qty 1

## 2016-12-29 NOTE — ED Notes (Signed)
PT ambulated with steady gait; no signs of distress.

## 2016-12-29 NOTE — ED Notes (Signed)
PT pale and is nauseated. MD informed
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
## 2016-12-29 NOTE — ED Notes (Signed)
ED Provider at bedside. 

## 2016-12-29 NOTE — ED Notes (Signed)
Pt in xray
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
## 2016-12-29 NOTE — ED Notes (Signed)
Pt reports nausea improving; instructed to take small sips.

## 2016-12-30 ENCOUNTER — Encounter: Payer: Self-pay | Admitting: Family Medicine

## 2016-12-30 ENCOUNTER — Ambulatory Visit (INDEPENDENT_AMBULATORY_CARE_PROVIDER_SITE_OTHER): Payer: BLUE CROSS/BLUE SHIELD | Admitting: Family Medicine

## 2016-12-30 DIAGNOSIS — F411 Generalized anxiety disorder: Secondary | ICD-10-CM | POA: Diagnosis not present

## 2016-12-30 MED ORDER — PANTOPRAZOLE SODIUM 40 MG PO TBEC: 40.0000 mg | DELAYED_RELEASE_TABLET | Freq: Every day | ORAL | 0 refills | Status: DC

## 2016-12-30 MED ORDER — BUSPIRONE HCL 7.5 MG PO TABS: 7.5000 mg | ORAL_TABLET | Freq: Two times a day (BID) | ORAL | 1 refills | Status: DC

## 2016-12-30 MED ORDER — NAPROXEN 500 MG PO TABS: ORAL_TABLET | ORAL | 2 refills | Status: DC

## 2016-12-30 MED ORDER — PREDNISONE 50 MG PO TABS: 50.0000 mg | ORAL_TABLET | Freq: Once | ORAL | 0 refills | Status: AC
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
## 2017-01-21 ENCOUNTER — Encounter: Payer: Self-pay | Admitting: Cardiovascular Disease

## 2017-01-21 ENCOUNTER — Ambulatory Visit (INDEPENDENT_AMBULATORY_CARE_PROVIDER_SITE_OTHER): Payer: BLUE CROSS/BLUE SHIELD | Admitting: Cardiovascular Disease

## 2017-01-21 VITALS — BP 138/100 | HR 67 | Ht 68.0 in | Wt 217.6 lb

## 2017-01-21 DIAGNOSIS — G4730 Sleep apnea, unspecified: Secondary | ICD-10-CM

## 2017-01-21 DIAGNOSIS — R002 Palpitations: Secondary | ICD-10-CM

## 2017-01-21 DIAGNOSIS — R0789 Other chest pain: Secondary | ICD-10-CM | POA: Diagnosis not present

## 2017-01-21 MED ORDER — METOPROLOL SUCCINATE ER 25 MG PO TB24: 25.0000 mg | ORAL_TABLET | Freq: Every day | ORAL | 6 refills | Status: DC

## 2017-01-21 NOTE — Patient Instructions (Signed)
Medication Instructions: START TOPROL-XL 25 mg daily.   Testing/Procedures: Your physician has recommended that you have a sleep study. This test records several body functions during sleep, including: brain activity, eye movement, oxygen and carbon dioxide blood levels, heart rate and rhythm, breathing rate and rhythm, the flow of air through your mouth and nose, snoring, body muscle movements, and chest and belly movement.  Your physician has requested that you have a lexiscan myoview. For further information please visit [email or web address]. Please follow instruction sheet, as given.  Follow-Up: Your physician recommends that you schedule a follow-up appointment after testing is complete.  If you need a refill on your cardiac medications before your next appointment, please call your pharmacy.

## 2017-01-21 NOTE — Assessment & Plan Note (Signed)
[name] complains of atypical chest pain. Occurs at rest and with exertion with substernal. Her only risk factor is hypertension and family history. I'm going to get a pharmacologic Myoview stress test to further evaluate. A 2-D echo performed at outside facility was entirely normal.

## 2017-01-21 NOTE — Progress Notes (Signed)
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 She is referred by Armanda Heritage nurse practitioner for cardiovascular evaluation. She has seen Dr. Claudie Leach cardiologist, in the past as well. She's been seen for dilation of atypical chest pain and palpitations. She's had palpitations since her 18s which is gotten worse recently. They have been responsive to Valsalva maneuver in the past. She also complains of atypical chest pain. She had a 2-D echo which was normal and the monitor showed only PVCs. Lower extremity Dopplers are normal as well. She has cut out caffeine from her diet. Her the problems including true hypertension.   Current Outpatient Prescriptions  Medication Sig Dispense Refill  . aspirin 81 MG tablet Take 81 mg by mouth daily.    . busPIRone (BUSPAR) 7.5 MG tablet Take 1 tablet (7.5 mg total) by mouth 2 (two) times daily. 60 tablet 1  . hydrochlorothiazide (HYDRODIURIL) 25 MG tablet TAKE ONE TABLET BY MOUTH IN THE MORNING (Patient taking differently: Take 25 mg by mouth every other day. ) 30 tablet 1  . losartan (COZAAR) 100 MG tablet Take 1 tablet (100 mg total) by mouth daily. 90 tablet 1   No current facility-administered medications for this visit.     Allergies  Allergen Reactions  . Latex Shortness Of Breath  . Other Swelling    "ink" on newspapers, catalogues, phone book  . Septra [Bactrim] Hives and Rash    Social History   Social History  . Marital status: Widowed    Spouse name: N/A  . Number of children: N/A  . Years of education: N/A   Occupational History  . Not on file.   Social History Main Topics  . Smoking status: Never Smoker  . Smokeless tobacco:  Never Used  . Alcohol use No  . Drug use: No  . Sexual activity: Not Currently    Birth control/ protection: None   Other Topics Concern  . Not on file   Social History Narrative   Working towards being [employer].      Review of Systems: General: negative for chills, fever, night sweats or weight changes.  Cardiovascular: negative for chest pain, dyspnea on exertion, edema, orthopnea, palpitations, paroxysmal nocturnal dyspnea or shortness of breath Dermatological: negative for rash Respiratory: negative for cough or wheezing Urologic: negative for hematuria Abdominal: negative for nausea, vomiting, diarrhea, bright red blood per rectum, melena, or hematemesis Neurologic: negative for visual changes, syncope, or dizziness All other systems reviewed and are otherwise negative except as noted above.    Blood pressure (!) 138/100, pulse 67, height 5\' 8"  (1.727 m), weight 217 lb 9.6 oz (98.7 kg), last menstrual period 10/06/2013, SpO2 96 %.  General appearance: alert and no distress Neck: no adenopathy, no carotid bruit, no JVD, supple, symmetrical, trachea midline and thyroid not enlarged, symmetric, no tenderness/mass/nodules Lungs: clear to auscultation bilaterally Heart: regular rate and rhythm, S1, S2 normal, no murmur, click, rub or gallop Extremities: extremities normal, atraumatic, no cyanosis or edema  EKG not performed today  ASSESSMENT AND PLAN:   Atypical chest pain [name] complains of atypical chest pain. Occurs at rest and with exertion with substernal. Her only risk factor is hypertension and family history.  I'm going to get a pharmacologic Myoview stress test to further evaluate. A 2-D echo performed at outside facility was entirely normal.  Palpitations [name] presents today for evaluation of palpitations. These have been occurring since her 32s were gotten worse recently. The abdomen has been responsive to Valsalva maneuvers. She has cut out caffeine from her diet.  She does give symptoms compatible with obstructive sleep apnea. 2-D echo was normal and the monitor showed only PVCs. I am going to get outpatient sleep study as well as begin her on low-dose beta blocker.      Lorretta Harp MD FACP,FACC,FAHA, [facility] 01/21/2017 10:48 AM

## 2017-01-21 NOTE — Assessment & Plan Note (Signed)
[name] presents today for evaluation of palpitations. These have been occurring since her 75s were gotten worse recently. The abdomen has been responsive to Valsalva maneuvers. She has cut out caffeine from her diet. She does give symptoms compatible with obstructive sleep apnea. 2-D echo was normal and the monitor showed only PVCs. I am going to get outpatient sleep study as well as begin her on low-dose beta blocker.

## 2017-01-29 ENCOUNTER — Telehealth (HOSPITAL_COMMUNITY): Payer: Self-pay

## 2017-01-29 NOTE — Telephone Encounter (Signed)
Encounter complete. 

## 2017-01-30 ENCOUNTER — Telehealth (HOSPITAL_COMMUNITY): Payer: Self-pay

## 2017-01-30 NOTE — Telephone Encounter (Signed)
Encounter complete. 

## 2017-02-03 ENCOUNTER — Ambulatory Visit (HOSPITAL_COMMUNITY)
Admission: RE | Admit: 2017-02-03 | Discharge: 2017-02-03 | Disposition: A | Discharge status: Home / Self Care | Payer: BLUE CROSS/BLUE SHIELD | Source: Ambulatory Visit | Attending: Cardiology | Admitting: Cardiology

## 2017-02-03 DIAGNOSIS — R002 Palpitations: Secondary | ICD-10-CM

## 2017-02-03 DIAGNOSIS — R9439 Abnormal result of other cardiovascular function study: Secondary | ICD-10-CM | POA: Diagnosis not present

## 2017-02-03 DIAGNOSIS — R0789 Other chest pain: Secondary | ICD-10-CM

## 2017-02-03 DIAGNOSIS — I259 Chronic ischemic heart disease, unspecified: Secondary | ICD-10-CM | POA: Insufficient documentation

## 2017-02-03 DIAGNOSIS — I510 Cardiac septal defect, acquired: Secondary | ICD-10-CM | POA: Insufficient documentation

## 2017-02-03 LAB — MYOCARDIAL PERFUSION IMAGING
CHL CUP NUCLEAR SDS: 6
CHL CUP NUCLEAR SRS: 0
CSEPPHR: 109 {beats}/min
LV dias vol: 64 mL (ref 46–106)
LVSYSVOL: 23 mL
Rest HR: 67 {beats}/min
SSS: 6
TID: 1.01

## 2017-02-03 MED ORDER — AMINOPHYLLINE 25 MG/ML IV SOLN
150.0000 mg | Freq: Once | INTRAVENOUS | Status: AC
  Administered 2017-02-03: 150 mg via INTRAVENOUS

## 2017-02-03 MED ORDER — TECHNETIUM TC 99M TETROFOSMIN IV KIT
31.4000 | PACK | Freq: Once | INTRAVENOUS | Status: AC | PRN
Start: 2017-02-03 — End: 2017-02-03
  Administered 2017-02-03: 31.4 via INTRAVENOUS
  Filled 2017-02-03: qty 32

## 2017-02-03 MED ORDER — REGADENOSON 0.4 MG/5ML IV SOLN
0.4000 mg | Freq: Once | INTRAVENOUS | Status: AC
  Administered 2017-02-03: 0.4 mg via INTRAVENOUS

## 2017-02-03 MED ORDER — TECHNETIUM TC 99M TETROFOSMIN IV KIT
10.2000 | PACK | Freq: Once | INTRAVENOUS | Status: AC | PRN
  Administered 2017-02-03: 10.2 via INTRAVENOUS
  Filled 2017-02-03: qty 11

## 2017-02-04 ENCOUNTER — Encounter: Payer: Self-pay | Admitting: Cardiovascular Disease

## 2017-02-04 ENCOUNTER — Encounter: Payer: Self-pay | Admitting: *Deleted

## 2017-02-04 ENCOUNTER — Telehealth: Payer: Self-pay | Admitting: Cardiovascular Disease

## 2017-02-04 ENCOUNTER — Ambulatory Visit (INDEPENDENT_AMBULATORY_CARE_PROVIDER_SITE_OTHER): Payer: BLUE CROSS/BLUE SHIELD | Admitting: Cardiovascular Disease

## 2017-02-04 DIAGNOSIS — R0789 Other chest pain: Secondary | ICD-10-CM

## 2017-02-04 DIAGNOSIS — R002 Palpitations: Secondary | ICD-10-CM

## 2017-02-04 MED ORDER — METOPROLOL SUCCINATE ER 50 MG PO TB24: 50.0000 mg | ORAL_TABLET | Freq: Every day | ORAL | 6 refills | Status: DC

## 2017-02-04 NOTE — Telephone Encounter (Signed)
Thanks

## 2017-02-04 NOTE — Addendum Note (Signed)
Addended by: Therisa Doyne on: 02/04/2017 04:48 PM   Modules accepted: Orders

## 2017-02-04 NOTE — Progress Notes (Signed)
[name] returns today for follow-up of her Myoview stress test performed 02/03/17. This was notable for inferior ischemia with normal ejection fraction. She does have some atypical chest pain symptoms. Based on this we decided to proceed with outpatient coronary angiography via the right radial approach.The patient understands that risks included but are not limited to stroke (1 in 1000), death (1 in 38), kidney failure [usually temporary] (1 in 500), bleeding (1 in 200), allergic reaction [possibly serious] (1 in 200). The patient understands and agrees to proceed

## 2017-02-04 NOTE — Telephone Encounter (Signed)
New message     Pt will be at office by 420p 5/16 she is leaving work now.

## 2017-02-04 NOTE — Patient Instructions (Addendum)
   [place] [address] [place] [address] Dept: [place]: [phone]  [name]  02/04/2017  You are scheduled for a Cardiac Catheterization on Thursday, June 7 with Dr. Quay Burow.  1. Please arrive at the [facility] (Main Entrance A) at [facility]: [place], [place] [address] at 10:00 AM (two hours before your procedure to ensure your preparation). Free valet parking service is available.   Special note: Every effort is made to have your procedure done on time. Please understand that emergencies sometimes delay scheduled procedures.  2. Diet: Do not eat or drink anything after midnight prior to your procedure except sips of water to take medications.  3. Labs: You will need to have blood drawn on Monday, May 28 at [place], [place]  Open: [place] (Lunch 12:30 - 1:30)   Phone: [phone]. You do not need to be fasting.  4. Medication instructions in preparation for your procedure:  On the morning of your procedure, take your Aspirin and any morning medicines NOT listed above.  You may use sips of water.  5. Plan for one night stay--bring personal belongings. 6. Bring a current list of your medications and current insurance cards. 7. You MUST have a responsible person to drive you home. 8. Someone MUST be with you the first 24 hours after you arrive home or your discharge will be delayed. 9. Please wear clothes that are easy to get on and off and wear slip-on shoes.  Thank you for allowing [place] to care for you!   -- [facility]   Other Instrustions:  Increase your Metoprolol to 50 mg daily.

## 2017-02-04 NOTE — Addendum Note (Signed)
Addended by: Therisa Doyne on: 02/04/2017 04:52 PM   Modules accepted: Orders

## 2017-02-04 NOTE — Telephone Encounter (Signed)
New message    Pt is calling asking for a note for work for when she came yesterday for her stress test. She said it can be faxed to [phone] Attention-[place] at [facility].

## 2017-02-04 NOTE — Telephone Encounter (Signed)
Routed to [place], [place] as Juluis Rainier
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
## 2017-02-04 NOTE — Assessment & Plan Note (Signed)
[name] returns today for follow-up of her Myoview stress test performed 02/03/17. This was notable for inferior ischemia with normal ejection fraction. She does have some atypical chest pain symptoms. Based on this we decided to proceed with outpatient coronary angiography via the right radial approach.The patient understands that risks included but are not limited to stroke (1 in 1000), death (1 in 64), kidney failure [usually temporary] (1 in 500), bleeding (1 in 200), allergic reaction [possibly serious] (1 in 200). The patient understands and agrees to proceed

## 2017-02-17 LAB — CBC WITH DIFFERENTIAL/PLATELET
Basophils Absolute: 73 {cells}/uL (ref 0–200)
Basophils Relative: 1 %
Eosinophils Absolute: 219 {cells}/uL (ref 15–500)
Eosinophils Relative: 3 %
HCT: 38.4 % (ref 35.0–45.0)
Hemoglobin: 12.5 g/dL (ref 11.7–15.5)
Lymphocytes Relative: 30 %
Lymphs Abs: 2190 {cells}/uL (ref 850–3900)
MCH: 27.9 pg (ref 27.0–33.0)
MCHC: 32.6 g/dL (ref 32.0–36.0)
MCV: 85.7 fL (ref 80.0–100.0)
MPV: 9.5 fL (ref 7.5–12.5)
Monocytes Absolute: 365 {cells}/uL (ref 200–950)
Monocytes Relative: 5 %
Neutro Abs: 4453 {cells}/uL (ref 1500–7800)
Neutrophils Relative %: 61 %
Platelets: 297 K/uL (ref 140–400)
RBC: 4.48 MIL/uL (ref 3.80–5.10)
RDW: 14 % (ref 11.0–15.0)
WBC: 7.3 K/uL (ref 3.8–10.8)

## 2017-02-18 ENCOUNTER — Other Ambulatory Visit: Payer: Self-pay | Admitting: Cardiovascular Disease

## 2017-02-18 DIAGNOSIS — R0789 Other chest pain: Secondary | ICD-10-CM

## 2017-02-18 LAB — BASIC METABOLIC PANEL WITH GFR
BUN: 18 mg/dL (ref 7–25)
CALCIUM: 10 mg/dL (ref 8.6–10.4)
CO2: 28 mmol/L (ref 20–31)
Chloride: 104 mmol/L (ref 98–110)
Creat: 0.8 mg/dL (ref 0.50–1.05)
GFR, EST NON AFRICAN AMERICAN: 86 mL/min (ref >=60)
GLUCOSE: 92 mg/dL (ref 65–99)
Potassium: 3.9 mmol/L (ref 3.5–5.3)
Sodium: 140 mmol/L (ref 135–146)

## 2017-02-18 LAB — PROTIME-INR
INR: 1
PROTHROMBIN TIME: 10.2 s (ref 9.0–11.5)

## 2017-02-18 LAB — APTT: aPTT: 25 s (ref 22–34)

## 2017-02-18 LAB — TSH: TSH: 1.51 m[IU]/L

## 2017-02-23 ENCOUNTER — Telehealth: Payer: Self-pay | Admitting: Cardiovascular Disease

## 2017-02-23 ENCOUNTER — Ambulatory Visit (INDEPENDENT_AMBULATORY_CARE_PROVIDER_SITE_OTHER): Payer: BLUE CROSS/BLUE SHIELD | Admitting: Family Medicine

## 2017-02-23 ENCOUNTER — Emergency Department (HOSPITAL_COMMUNITY): Payer: BLUE CROSS/BLUE SHIELD

## 2017-02-23 ENCOUNTER — Ambulatory Visit: Payer: BLUE CROSS/BLUE SHIELD | Admitting: Family Medicine

## 2017-02-23 ENCOUNTER — Observation Stay (HOSPITAL_COMMUNITY)
Admission: EM | Admit: 2017-02-23 | Discharge: 2017-02-24 | Disposition: A | Discharge status: Home / Self Care | Payer: BLUE CROSS/BLUE SHIELD | Attending: Cardiovascular Disease | Admitting: Cardiovascular Disease

## 2017-02-23 ENCOUNTER — Encounter: Payer: Self-pay | Admitting: Family Medicine

## 2017-02-23 ENCOUNTER — Other Ambulatory Visit: Payer: Self-pay

## 2017-02-23 ENCOUNTER — Encounter (HOSPITAL_COMMUNITY): Payer: Self-pay

## 2017-02-23 VITALS — BP 140/88 | HR 80 | Temp 98.1°F | Ht 68.0 in | Wt 219.2 lb

## 2017-02-23 DIAGNOSIS — G4733 Obstructive sleep apnea (adult) (pediatric): Secondary | ICD-10-CM | POA: Diagnosis not present

## 2017-02-23 DIAGNOSIS — R002 Palpitations: Secondary | ICD-10-CM | POA: Diagnosis not present

## 2017-02-23 DIAGNOSIS — Z7982 Long term (current) use of aspirin: Secondary | ICD-10-CM | POA: Insufficient documentation

## 2017-02-23 DIAGNOSIS — R079 Chest pain, unspecified: Secondary | ICD-10-CM | POA: Diagnosis not present

## 2017-02-23 DIAGNOSIS — E669 Obesity, unspecified: Secondary | ICD-10-CM | POA: Insufficient documentation

## 2017-02-23 DIAGNOSIS — R9439 Abnormal result of other cardiovascular function study: Secondary | ICD-10-CM | POA: Diagnosis not present

## 2017-02-23 DIAGNOSIS — R072 Precordial pain: Principal | ICD-10-CM

## 2017-02-23 DIAGNOSIS — R0789 Other chest pain: Secondary | ICD-10-CM

## 2017-02-23 DIAGNOSIS — F329 Major depressive disorder, single episode, unspecified: Secondary | ICD-10-CM | POA: Diagnosis not present

## 2017-02-23 DIAGNOSIS — R51 Headache: Secondary | ICD-10-CM | POA: Insufficient documentation

## 2017-02-23 DIAGNOSIS — I200 Unstable angina: Secondary | ICD-10-CM | POA: Insufficient documentation

## 2017-02-23 DIAGNOSIS — Z6832 Body mass index (BMI) 32.0-32.9, adult: Secondary | ICD-10-CM | POA: Diagnosis not present

## 2017-02-23 DIAGNOSIS — F411 Generalized anxiety disorder: Secondary | ICD-10-CM | POA: Diagnosis not present

## 2017-02-23 DIAGNOSIS — Z91040 Latex allergy status: Secondary | ICD-10-CM | POA: Diagnosis not present

## 2017-02-23 DIAGNOSIS — E785 Hyperlipidemia, unspecified: Secondary | ICD-10-CM | POA: Diagnosis not present

## 2017-02-23 DIAGNOSIS — I2510 Atherosclerotic heart disease of native coronary artery without angina pectoris: Secondary | ICD-10-CM | POA: Diagnosis not present

## 2017-02-23 DIAGNOSIS — Z8249 Family history of ischemic heart disease and other diseases of the circulatory system: Secondary | ICD-10-CM | POA: Insufficient documentation

## 2017-02-23 DIAGNOSIS — I10 Essential (primary) hypertension: Secondary | ICD-10-CM | POA: Insufficient documentation

## 2017-02-23 LAB — HEPATIC FUNCTION PANEL
ALBUMIN: 3.8 g/dL (ref 3.5–5.0)
ALT: 17 U/L (ref 14–54)
AST: 20 U/L (ref 15–41)
Alkaline Phosphatase: 64 U/L (ref 38–126)
Bilirubin, Direct: 0.1 mg/dL (ref 0.1–0.5)
Indirect Bilirubin: 0.6 mg/dL (ref 0.3–0.9)
TOTAL PROTEIN: 6.5 g/dL (ref 6.5–8.1)
Total Bilirubin: 0.7 mg/dL (ref 0.3–1.2)

## 2017-02-23 LAB — BASIC METABOLIC PANEL
ANION GAP: 12 (ref 5–15)
BUN: 13 mg/dL (ref 6–20)
CHLORIDE: 104 mmol/L (ref 101–111)
CO2: 23 mmol/L (ref 22–32)
Calcium: 10.2 mg/dL (ref 8.9–10.3)
Creatinine, Ser: 0.73 mg/dL (ref 0.44–1.00)
GFR calc Af Amer: >60 mL/min (ref >60)
GLUCOSE: 88 mg/dL (ref 65–99)
POTASSIUM: 3.5 mmol/L (ref 3.5–5.1)
Sodium: 139 mmol/L (ref 135–145)

## 2017-02-23 LAB — CBC
HEMATOCRIT: 40.2 % (ref 36.0–46.0)
HEMATOCRIT: 39.8 % (ref 36.0–46.0)
HEMOGLOBIN: 13 g/dL (ref 12.0–15.0)
HEMOGLOBIN: 12.6 g/dL (ref 12.0–15.0)
MCH: 27.8 pg (ref 26.0–34.0)
MCH: 27.5 pg (ref 26.0–34.0)
MCHC: 32.3 g/dL (ref 30.0–36.0)
MCHC: 31.7 g/dL (ref 30.0–36.0)
MCV: 86.1 fL (ref 78.0–100.0)
MCV: 86.7 fL (ref 78.0–100.0)
Platelets: 277 10*3/uL (ref 150–400)
Platelets: 287 10*3/uL (ref 150–400)
RBC: 4.67 MIL/uL (ref 3.87–5.11)
RBC: 4.59 MIL/uL (ref 3.87–5.11)
RDW: 13.3 % (ref 11.5–15.5)
RDW: 13.4 % (ref 11.5–15.5)
WBC: 7.2 10*3/uL (ref 4.0–10.5)
WBC: 7.2 10*3/uL (ref 4.0–10.5)

## 2017-02-23 LAB — TROPONIN I: Troponin I: <0.03 ng/mL (ref <0.03)

## 2017-02-23 LAB — TSH: TSH: 1.571 u[IU]/mL (ref 0.350–4.500)

## 2017-02-23 LAB — CREATININE, SERUM
Creatinine, Ser: 0.81 mg/dL (ref 0.44–1.00)
GFR calc Af Amer: >60 mL/min (ref >60)

## 2017-02-23 LAB — I-STAT TROPONIN, ED: Troponin i, poc: 0 ng/mL (ref 0.00–0.08)

## 2017-02-23 LAB — PROTIME-INR
INR: 1.02
Prothrombin Time: 13.4 seconds (ref 11.4–15.2)

## 2017-02-23 LAB — T4, FREE: FREE T4: 1.03 ng/dL (ref 0.61–1.12)

## 2017-02-23 MED ORDER — ZOLPIDEM TARTRATE 5 MG PO TABS: 5.0000 mg | ORAL_TABLET | Freq: Every evening | ORAL | Status: DC | PRN

## 2017-02-23 MED ORDER — NITROGLYCERIN 2 % TD OINT
0.5000 [in_us] | TOPICAL_OINTMENT | Freq: Four times a day (QID) | TRANSDERMAL | Status: DC
  Administered 2017-02-23 – 2017-02-24 (×2): 0.5 [in_us] via TOPICAL
  Filled 2017-02-23: qty 30

## 2017-02-23 MED ORDER — METOPROLOL SUCCINATE ER 50 MG PO TB24
50.0000 mg | ORAL_TABLET | Freq: Every day | ORAL | Status: DC
  Administered 2017-02-24: 50 mg via ORAL
  Filled 2017-02-23: qty 1

## 2017-02-23 MED ORDER — VITAMIN D 1000 UNITS PO TABS
1000.0000 [IU] | ORAL_TABLET | Freq: Every day | ORAL | Status: DC
  Administered 2017-02-24: 1000 [IU] via ORAL
  Filled 2017-02-23: qty 1

## 2017-02-23 MED ORDER — NITROGLYCERIN 0.4 MG SL SUBL: 0.4000 mg | SUBLINGUAL_TABLET | SUBLINGUAL | Status: DC | PRN

## 2017-02-23 MED ORDER — SODIUM CHLORIDE 0.9 % IV SOLN: 250.0000 mL | INTRAVENOUS | Status: DC | PRN

## 2017-02-23 MED ORDER — ATORVASTATIN CALCIUM 80 MG PO TABS
80.0000 mg | ORAL_TABLET | Freq: Every day | ORAL | Status: DC
  Administered 2017-02-24: 80 mg via ORAL
  Filled 2017-02-23: qty 1

## 2017-02-23 MED ORDER — ASPIRIN 300 MG RE SUPP: 300.0000 mg | RECTAL | Status: DC

## 2017-02-23 MED ORDER — ACETAMINOPHEN 325 MG PO TABS
650.0000 mg | ORAL_TABLET | ORAL | Status: DC | PRN
  Administered 2017-02-23: 650 mg via ORAL
  Filled 2017-02-23: qty 2

## 2017-02-23 MED ORDER — SODIUM CHLORIDE 0.9 % WEIGHT BASED INFUSION
1.0000 mL/kg/h | INTRAVENOUS | Status: DC
  Administered 2017-02-24: 1 mL/kg/h via INTRAVENOUS

## 2017-02-23 MED ORDER — BUSPIRONE HCL 5 MG PO TABS
7.5000 mg | ORAL_TABLET | Freq: Every day | ORAL | Status: DC
  Administered 2017-02-24: 7.5 mg via ORAL
  Filled 2017-02-23: qty 2

## 2017-02-23 MED ORDER — ACETAMINOPHEN 500 MG PO TABS
1000.0000 mg | ORAL_TABLET | Freq: Every day | ORAL | Status: DC | PRN
  Administered 2017-02-24: 1000 mg via ORAL
  Filled 2017-02-23: qty 2

## 2017-02-23 MED ORDER — BUSPIRONE HCL 5 MG PO TABS: 7.5000 mg | ORAL_TABLET | ORAL | Status: DC

## 2017-02-23 MED ORDER — HEPARIN SODIUM (PORCINE) 5000 UNIT/ML IJ SOLN
5000.0000 [IU] | Freq: Three times a day (TID) | INTRAMUSCULAR | Status: DC
  Administered 2017-02-23 – 2017-02-24 (×2): 5000 [IU] via SUBCUTANEOUS
  Filled 2017-02-23 (×2): qty 1

## 2017-02-23 MED ORDER — ASPIRIN 81 MG PO CHEW
81.0000 mg | CHEWABLE_TABLET | ORAL | Status: AC
  Administered 2017-02-24: 81 mg via ORAL
  Filled 2017-02-23: qty 1

## 2017-02-23 MED ORDER — ONDANSETRON HCL 4 MG/2ML IJ SOLN: 4.0000 mg | Freq: Four times a day (QID) | INTRAMUSCULAR | Status: DC | PRN

## 2017-02-23 MED ORDER — ALPRAZOLAM 0.25 MG PO TABS: 0.2500 mg | ORAL_TABLET | Freq: Two times a day (BID) | ORAL | Status: DC | PRN

## 2017-02-23 MED ORDER — ASPIRIN EC 81 MG PO TBEC: 81.0000 mg | DELAYED_RELEASE_TABLET | Freq: Every day | ORAL | Status: DC

## 2017-02-23 MED ORDER — ASPIRIN 81 MG PO CHEW: 324.0000 mg | CHEWABLE_TABLET | ORAL | Status: DC

## 2017-02-23 MED ORDER — ALBUTEROL SULFATE (2.5 MG/3ML) 0.083% IN NEBU: 3.0000 mL | INHALATION_SOLUTION | Freq: Four times a day (QID) | RESPIRATORY_TRACT | Status: DC | PRN

## 2017-02-23 MED ORDER — SODIUM CHLORIDE 0.9 % WEIGHT BASED INFUSION
3.0000 mL/kg/h | INTRAVENOUS | Status: DC
  Administered 2017-02-24: 3 mL/kg/h via INTRAVENOUS

## 2017-02-23 MED ORDER — SODIUM CHLORIDE 0.9% FLUSH: 3.0000 mL | INTRAVENOUS | Status: DC | PRN

## 2017-02-23 MED ORDER — SODIUM CHLORIDE 0.9% FLUSH
3.0000 mL | Freq: Two times a day (BID) | INTRAVENOUS | Status: DC
  Administered 2017-02-23 – 2017-02-24 (×2): 3 mL via INTRAVENOUS

## 2017-02-23 NOTE — ED Triage Notes (Signed)
Patient coming in by [facility] for chest pain and shortness of breath.  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  Patient is A&Ox4 with persistent chest pain in left breast running to left arm and neck.  324 ASA on board pain 5/10.

## 2017-02-23 NOTE — Telephone Encounter (Signed)
Please call,pt says she have had some developments since she last seen Dr Gwenlyn Found.

## 2017-02-23 NOTE — ED Notes (Signed)
Patient returned from XRAY 

## 2017-02-23 NOTE — ED Notes (Signed)
Patient taken to XRAY

## 2017-02-23 NOTE — Telephone Encounter (Signed)
Spoke with pt, she has found out from her doctor some test results that she did not know before. She also continues to have left arm, back and chest discomfort. She reports her chest is sore to the touch and she has increased fatigue. She is scheduled for a cath Thursday this week. She reports a terrible h/a for the last couple days and a report of her bp being 80/60 at work. Offered the patient a follow up with dr berry this Wednesday but she is going to get in touch with her medical doctor and see them. She will call once she is seen if there are any concerns, otherwise she is going to wait for procedure Thursday.
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
## 2017-02-23 NOTE — Progress Notes (Signed)
   Subjective:   Patient ID: [name]    DOB: 12/10/1965, 51 y.o. female   MRN: [id number]  CC: chest pain, shortness of breath   HPI: [name] is a 52 y.o. female who presents to clinic today for chest pain.    Notes she has not been feeling right since last Wednesday.  She notes chest pain radiates to L arm and L shoulder.  Severity is 5-6/10.   She states she has had extensive workup with Dr. Claudie Leach and Dr. Gwenlyn Found at [facility] including heart monitor, Echo and stress test.  She was told she would need a cath and has been scheduled for 02/26/2017.   Additional symptoms include shortness of breath she has noted on exertion and a headache that does not seem to go away.    Per chart review, she had a myoview stress test performed 5/15 which was notable for inferior ischemia with normal ejection fraction.  ROS: Denies fevers, chills, nausea, vomiting, abdominal pain.  +Headache and chest pain.  [place]: Pertinent past medical, surgical, family, and social history were reviewed and updated as appropriate. Smoking status reviewed.  Medications reviewed.  Objective:   BP 140/88   Pulse 80   Temp 98.1 F (36.7 C) (Oral)   Ht 5\' 8"  (1.727 m)   Wt 219 lb 3.2 oz (99.4 kg)   LMP 10/06/2013   SpO2 95%   BMI 33.33 kg/m  Vitals and nursing note reviewed.   General: 51 yo F in NAD  HEENT: NCAT, MMM, o/p clear  Neck: supple, no JVD  CV: RRR no MRG, palpable pulses  Lungs: CTAB, comfortable work of breathing  Abdomen: soft, NTND, +bs  Skin: warm, dry  Neuro: AOx4, no focal deficits  Assessment & Plan:   Chest pain  -P/w active chest pain rated 5-6/10 in severity with radiation to L arm.  -EKG with possible evidence of ischemia in V1 and V2 however no ST segment elevations suggesting STEMI.   -Will send to ED via EMS  Orders Placed This Encounter  Procedures  . EKG 12-Lead    Lovenia Kim, MD [place], PGY-1 02/23/2017 5:21 PM

## 2017-02-23 NOTE — Telephone Encounter (Signed)
Left message for pt to call.

## 2017-02-24 ENCOUNTER — Ambulatory Visit (HOSPITAL_COMMUNITY)
Admit: 2017-02-24 | Discharge: 2017-02-24 | Disposition: A | Discharge status: Home / Self Care | Payer: BLUE CROSS/BLUE SHIELD | Attending: Family Medicine | Admitting: Family Medicine

## 2017-02-24 ENCOUNTER — Encounter (HOSPITAL_COMMUNITY)
Admission: EM | Disposition: A | Discharge status: Home / Self Care | Payer: Self-pay | Source: Home / Self Care | Attending: Emergency Medicine

## 2017-02-24 ENCOUNTER — Encounter (HOSPITAL_COMMUNITY): Payer: Self-pay | Admitting: Cardiovascular Disease

## 2017-02-24 DIAGNOSIS — I2510 Atherosclerotic heart disease of native coronary artery without angina pectoris: Secondary | ICD-10-CM

## 2017-02-24 DIAGNOSIS — R51 Headache: Secondary | ICD-10-CM | POA: Diagnosis not present

## 2017-02-24 DIAGNOSIS — R072 Precordial pain: Secondary | ICD-10-CM | POA: Diagnosis not present

## 2017-02-24 DIAGNOSIS — R9439 Abnormal result of other cardiovascular function study: Secondary | ICD-10-CM | POA: Diagnosis not present

## 2017-02-24 DIAGNOSIS — I200 Unstable angina: Secondary | ICD-10-CM | POA: Diagnosis not present

## 2017-02-24 DIAGNOSIS — R0789 Other chest pain: Secondary | ICD-10-CM

## 2017-02-24 DIAGNOSIS — I10 Essential (primary) hypertension: Secondary | ICD-10-CM | POA: Diagnosis not present

## 2017-02-24 DIAGNOSIS — E7800 Pure hypercholesterolemia, unspecified: Secondary | ICD-10-CM | POA: Diagnosis not present

## 2017-02-24 HISTORY — PX: LEFT HEART CATH AND CORONARY ANGIOGRAPHY: CATH118249

## 2017-02-24 LAB — BASIC METABOLIC PANEL
Anion gap: 8 (ref 5–15)
BUN: 15 mg/dL (ref 6–20)
CHLORIDE: 105 mmol/L (ref 101–111)
CO2: 26 mmol/L (ref 22–32)
Calcium: 9.8 mg/dL (ref 8.9–10.3)
Creatinine, Ser: 0.85 mg/dL (ref 0.44–1.00)
GFR calc Af Amer: >60 mL/min (ref >60)
GFR calc non Af Amer: >60 mL/min (ref >60)
Glucose, Bld: 128 mg/dL — ABNORMAL HIGH (ref 65–99)
POTASSIUM: 3.6 mmol/L (ref 3.5–5.1)
SODIUM: 139 mmol/L (ref 135–145)

## 2017-02-24 LAB — LIPID PANEL
CHOL/HDL RATIO: 3.7 ratio
Cholesterol: 178 mg/dL (ref 0–200)
HDL: 48 mg/dL (ref >40)
LDL CALC: 96 mg/dL (ref 0–99)
Triglycerides: 169 mg/dL — ABNORMAL HIGH (ref <150)
VLDL: 34 mg/dL (ref 0–40)

## 2017-02-24 LAB — TROPONIN I
Troponin I: 0.08 ng/mL (ref <0.03)
Troponin I: <0.03 ng/mL (ref <0.03)

## 2017-02-24 LAB — CBC
HCT: 37.3 % (ref 36.0–46.0)
HEMOGLOBIN: 12 g/dL (ref 12.0–15.0)
MCH: 27.9 pg (ref 26.0–34.0)
MCHC: 32.2 g/dL (ref 30.0–36.0)
MCV: 86.7 fL (ref 78.0–100.0)
Platelets: 261 10*3/uL (ref 150–400)
RBC: 4.3 MIL/uL (ref 3.87–5.11)
RDW: 13.4 % (ref 11.5–15.5)
WBC: 6.6 10*3/uL (ref 4.0–10.5)

## 2017-02-24 SURGERY — LEFT HEART CATH AND CORONARY ANGIOGRAPHY
Anesthesia: LOCAL

## 2017-02-24 MED ORDER — SODIUM CHLORIDE 0.9% FLUSH: 3.0000 mL | Freq: Two times a day (BID) | INTRAVENOUS | Status: DC

## 2017-02-24 MED ORDER — LIDOCAINE HCL (PF) 1 % IJ SOLN
INTRAMUSCULAR | Status: DC | PRN
Start: 2017-02-24 — End: 2017-02-24
  Administered 2017-02-24: 2 mL

## 2017-02-24 MED ORDER — MIDAZOLAM HCL 2 MG/2ML IJ SOLN
INTRAMUSCULAR | Status: AC
  Filled 2017-02-24: qty 2

## 2017-02-24 MED ORDER — VERAPAMIL HCL 2.5 MG/ML IV SOLN
INTRAVENOUS | Status: AC
  Filled 2017-02-24: qty 2

## 2017-02-24 MED ORDER — HEPARIN SODIUM (PORCINE) 1000 UNIT/ML IJ SOLN
INTRAMUSCULAR | Status: DC | PRN
  Administered 2017-02-24: 5000 [IU] via INTRAVENOUS

## 2017-02-24 MED ORDER — IOPAMIDOL (ISOVUE-370) INJECTION 76%
INTRAVENOUS | Status: AC
  Filled 2017-02-24: qty 100

## 2017-02-24 MED ORDER — VERAPAMIL HCL 2.5 MG/ML IV SOLN
INTRAVENOUS | Status: DC | PRN
  Administered 2017-02-24: 09:00:00 via INTRA_ARTERIAL

## 2017-02-24 MED ORDER — SODIUM CHLORIDE 0.9 % IV SOLN: INTRAVENOUS | Status: AC

## 2017-02-24 MED ORDER — HEPARIN SODIUM (PORCINE) 1000 UNIT/ML IJ SOLN
INTRAMUSCULAR | Status: AC
  Filled 2017-02-24: qty 1

## 2017-02-24 MED ORDER — HEPARIN (PORCINE) IN NACL 2-0.9 UNIT/ML-% IJ SOLN
INTRAMUSCULAR | Status: AC | PRN
  Administered 2017-02-24: 1000 mL

## 2017-02-24 MED ORDER — MIDAZOLAM HCL 2 MG/2ML IJ SOLN
INTRAMUSCULAR | Status: DC | PRN
  Administered 2017-02-24 (×2): 1 mg via INTRAVENOUS

## 2017-02-24 MED ORDER — LIDOCAINE HCL (PF) 1 % IJ SOLN
INTRAMUSCULAR | Status: AC
  Filled 2017-02-24: qty 30

## 2017-02-24 MED ORDER — SODIUM CHLORIDE 0.9% FLUSH: 3.0000 mL | INTRAVENOUS | Status: DC | PRN

## 2017-02-24 MED ORDER — FENTANYL CITRATE (PF) 100 MCG/2ML IJ SOLN
INTRAMUSCULAR | Status: DC | PRN
Start: 2017-02-24 — End: 2017-02-24
  Administered 2017-02-24 (×2): 25 ug via INTRAVENOUS

## 2017-02-24 MED ORDER — ATORVASTATIN CALCIUM 20 MG PO TABS: 20.0000 mg | ORAL_TABLET | Freq: Every day | ORAL | 5 refills | Status: DC

## 2017-02-24 MED ORDER — SODIUM CHLORIDE 0.9 % IV SOLN
250.0000 mL | INTRAVENOUS | Status: DC | PRN
Start: 2017-02-24 — End: 2017-02-24

## 2017-02-24 MED ORDER — FENTANYL CITRATE (PF) 100 MCG/2ML IJ SOLN
INTRAMUSCULAR | Status: AC
  Filled 2017-02-24: qty 2

## 2017-02-24 MED ORDER — IOPAMIDOL (ISOVUE-370) INJECTION 76%
INTRAVENOUS | Status: DC | PRN
  Administered 2017-02-24: 90 mL via INTRAVENOUS

## 2017-02-24 MED ORDER — ATORVASTATIN CALCIUM 20 MG PO TABS: 20.0000 mg | ORAL_TABLET | Freq: Every day | ORAL | Status: DC

## 2017-02-24 MED ORDER — HEPARIN (PORCINE) IN NACL 2-0.9 UNIT/ML-% IJ SOLN
INTRAMUSCULAR | Status: AC
Start: 2017-02-24 — End: 2017-02-24
  Filled 2017-02-24: qty 1000

## 2017-02-24 SURGICAL SUPPLY — 12 items
CATH INFINITI 5 FR JL3.5 (CATHETERS) ×1 IMPLANT
CATH INFINITI 5FR ANG PIGTAIL (CATHETERS) ×1 IMPLANT
CATH INFINITI JR4 5F (CATHETERS) ×1 IMPLANT
DEVICE RAD COMP TR BAND LRG (VASCULAR PRODUCTS) ×1 IMPLANT
GLIDESHEATH SLEND SS 6F .021 (SHEATH) ×1 IMPLANT
GUIDEWIRE INQWIRE 1.5J.035X260 (WIRE) IMPLANT
INQWIRE 1.5J .035X260CM (WIRE) ×2
KIT HEART LEFT (KITS) ×2 IMPLANT
PACK CARDIAC CATHETERIZATION (CUSTOM PROCEDURE TRAY) ×2 IMPLANT
SYR MEDRAD MARK V 150ML (SYRINGE) ×2 IMPLANT
TRANSDUCER W/STOPCOCK (MISCELLANEOUS) ×2 IMPLANT
TUBING CIL FLEX 10 FLL-RA (TUBING) ×2 IMPLANT

## 2017-02-24 NOTE — Discharge Instructions (Signed)
Acute Coronary Syndrome °Acute coronary syndrome (ACS) is a serious problem in which there is suddenly not enough blood and oxygen supplied to the heart. ACS may mean that one or more of the blood vessels in your heart (coronary arteries) may be blocked. ACS can result in chest pain or a heart attack (myocardial infarction or MI). °What are the causes? °This condition is caused by atherosclerosis, which is the buildup of fat and cholesterol (plaque) on the inside of the arteries. Over time, the plaque may narrow or block the artery, and this will lessen blood flow to the heart. Plaque can also become weak and break off within a coronary artery to form a clot and cause a sudden blockage. °What increases the risk? °The risk factors of this condition include: °· High cholesterol levels. °· High blood pressure (hypertension). °· Smoking. °· Diabetes. °· Age. °· Family history of chest pain, heart disease, or stroke. °· Lack of exercise. °What are the signs or symptoms? °The most common signs of this condition include: °· Chest pain, which can be: °¨ A crushing or squeezing in the chest. °¨ A tightness, pressure, fullness, or heaviness in the chest. °¨ Present for more than a few minutes, or it can stop and recur. °· Pain in the arms, neck, jaw, or back. °· Unexplained heartburn or indigestion. °· Shortness of breath. °· Nausea. °· Sudden cold sweats. °· Feeling light-headed or dizzy. °Sometimes, this condition has no symptoms. °How is this diagnosed? °ACS may be diagnosed through the following tests: °· Electrocardiogram (ECG). °· Blood tests. °· Coronary angiogram. This is a procedure to look at the coronary arteries to see if there is any blockage. °How is this treated? °Treatment for ACS may include: °· Healthy behavioral changes to reduce or control risk factors. °· Medicine. °· Coronary stenting. A stent helps to keep an artery open. °· Coronary angioplasty. This procedure widens a narrowed or blocked  artery. °· Coronary artery bypass surgery. This will allow your blood to pass the blockage (bypass) to reach your heart. °Follow these instructions at home: °Eating and drinking °· Follow a heart-healthy diet. A dietitian can you help to educate you about healthy food options and changes. °· Use healthy cooking methods such as roasting, grilling, broiling, baking, poaching, steaming, or stir-frying. Talk to a dietitian to learn more about healthy cooking methods. °Medicines °· Take medicines only as directed by your health care provider. °· Do not take the following medicines unless your health care provider approves: °¨ Nonsteroidal anti-inflammatory drugs (NSAIDs), such as ibuprofen, naproxen, or celecoxib. °¨ Vitamin supplements that contain vitamin A, vitamin E, or both. °¨ Hormone replacement therapy that contains estrogen with or without progestin. °· Stop illegal drug use. °Activity °· Follow an exercise program that is approved by your health care provider. °· Plan rest periods when you are fatigued. °Lifestyle °· Do not use any tobacco products, including cigarettes, chewing tobacco, or electronic cigarettes. If you need help quitting, ask your health care provider. °· If you drink alcohol, and your health care provider approves, limit your alcohol intake to no more than 1 drink per day. One drink equals 12 ounces of beer, 5 ounces of wine, or 1½ ounces of hard liquor. °· Learn to manage stress. °· Maintain a healthy weight. Lose weight as approved by your health care provider. °General instructions °· Manage other health conditions, such as hypertension and diabetes, as directed by your health care provider. °· Keep all follow-up visits as directed by your   as directed by your health care provider. This is important.  Your health care provider may ask you to monitor your blood pressure. A blood pressure reading consists of a higher number over a lower number, such as 110 over 72, written as 110/72. Ideally, your blood  pressure should be: ? Below 140/90 if you have no other medical conditions. ? Below 130/80 if you have diabetes or kidney disease. Get help right away if:  You have pain in your chest, neck, arm, jaw, stomach, or back that lasts more than a few minutes, is recurring, or is not relieved by taking medicine under your tongue (sublingual nitroglycerin).  You have profuse sweating without cause.  You have unexplained: ? Heartburn or indigestion. ? Shortness of breath or difficulty breathing. ? Nausea or vomiting. ? Fatigue. ? Feelings of nervousness or anxiety. ? Weakness. ? Diarrhea.  You have sudden light-headedness or dizziness.  You faint. These symptoms may represent a serious problem that is an emergency. Do not wait to see if the symptoms will go away. Get medical help right away. Call your local emergency services (911 in the [place] Do not drive yourself to the clinic or hospital. This information is not intended to replace advice given to you by your health care provider. Make sure you discuss any questions you have with your health care provider. Document Released: 09/08/2005 Document Revised: 02/20/2016 Document Reviewed: 01/10/2014 Elsevier Interactive Patient Education  2017 [employer] Refer to this sheet in the next few weeks. These instructions provide you with information about caring for yourself after your procedure. Your health care provider may also give you more specific instructions. Your treatment has been planned according to current medical practices, but problems sometimes occur. Call your health care provider if you have any problems or questions after your procedure. What can I expect after the procedure? After your procedure, it is typical to have the following:  Bruising at the radial site that usually fades within 1-2 weeks.  Blood collecting in the tissue (hematoma) that may be painful to the touch. It should usually decrease in  size and tenderness within 1-2 weeks.  Follow these instructions at home:  Take medicines only as directed by your health care provider.  You may shower 24-48 hours after the procedure or as directed by your health care provider. Remove the bandage (dressing) and gently wash the site with plain soap and water. Pat the area dry with a clean towel. Do not rub the site, because this may cause bleeding.  Do not take baths, swim, or use a hot tub until your health care provider approves.  Check your insertion site every day for redness, swelling, or drainage.  Do not apply powder or lotion to the site.  Do not flex or bend the affected arm for 24 hours or as directed by your health care provider.  Do not push or pull heavy objects with the affected arm for 24 hours or as directed by your health care provider.  Do not lift over 10 lb (4.5 kg) for 5 days after your procedure or as directed by your health care provider.  Ask your health care provider when it is okay to: ? Return to work or school. ? Resume usual physical activities or sports. ? Resume sexual activity.  Do not drive home if you are discharged the same day as the procedure. Have someone else drive you.  You may drive 24 hours after the  procedure unless otherwise instructed by your health care provider.  Do not operate machinery or power tools for 24 hours after the procedure.  If your procedure was done as an outpatient procedure, which means that you went home the same day as your procedure, a responsible adult should be with you for the first 24 hours after you arrive home.  Keep all follow-up visits as directed by your health care provider. This is important. Contact a health care provider if:  You have a fever.  You have chills.  You have increased bleeding from the radial site. Hold pressure on the site. Get help right away if:  You have unusual pain at the radial site.  You have redness, warmth, or swelling  at the radial site.  You have drainage (other than a small amount of blood on the dressing) from the radial site.  The radial site is bleeding, and the bleeding does not stop after 30 minutes of holding steady pressure on the site.  Your arm or hand becomes pale, cool, tingly, or numb. This information is not intended to replace advice given to you by your health care provider. Make sure you discuss any questions you have with your health care provider. Document Released: 10/11/2010 Document Revised: 02/14/2016 Document Reviewed: 03/27/2014 Elsevier Interactive Patient Education  2018 [employer].

## 2017-02-24 NOTE — Interval H&P Note (Signed)
History and Physical Interval Note:  02/24/2017 8:54 AM  Kathie Dike  has presented today for cardiac cath  with the diagnosis of unstable angina  The various methods of treatment have been discussed with the patient and family. After consideration of risks, benefits and other options for treatment, the patient has consented to  Procedure(s): Left Heart Cath and Coronary Angiography (N/A) as a surgical intervention .  The patient's history has been reviewed, patient examined, no change in status, stable for surgery.  I have reviewed the patient's chart and labs.  Questions were answered to the patient's satisfaction.    Cath Lab Visit (complete for each Cath Lab visit)  Clinical Evaluation Leading to the Procedure:   ACS: Yes.    Non-ACS:    Anginal Classification: CCS III  Anti-ischemic medical therapy: Minimal Therapy (1 class of medications)  Non-Invasive Test Results: Intermediate-risk stress test findings: cardiac mortality 1-3%/year  Prior CABG: No previous CABG         Lauree Chandler

## 2017-02-24 NOTE — Progress Notes (Signed)
TR band removed without complications.  Reviewed discharge instructions with patient and she stated her understanding.  Also reviewed radial site care with patient.  Discharged home with friend via wheelchair. [name]

## 2017-02-24 NOTE — Progress Notes (Signed)
On-call cards fellow notified of positive troponin.

## 2017-02-24 NOTE — Progress Notes (Signed)
Progress Note  Patient Name: [name] Date of Encounter: 02/24/2017  Primary Cardiologist: Dr. Gwenlyn Found   Subjective   Still with intermittent chest pressure. Cath lab team currently present for transport to cath lab.   Inpatient Medications    Scheduled Meds: . aspirin  324 mg Oral NOW   Or  . aspirin  300 mg Rectal NOW  . [START ON 02/25/2017] aspirin EC  81 mg Oral Daily  . atorvastatin  80 mg Oral q1800  . busPIRone  7.5 mg Oral Daily  . cholecalciferol  1,000 Units Oral Daily  . heparin  5,000 Units Subcutaneous Q8H  . metoprolol succinate  50 mg Oral Daily  . nitroGLYCERIN  0.5 inch Topical Q6H  . sodium chloride flush  3 mL Intravenous Q12H   Continuous Infusions: . sodium chloride    . sodium chloride 1 mL/kg/hr (02/24/17 0708)   PRN Meds: sodium chloride, acetaminophen, acetaminophen, albuterol, ALPRAZolam, nitroGLYCERIN, ondansetron (ZOFRAN) IV, sodium chloride flush, zolpidem   Vital Signs    Vitals:   02/23/17 1945 02/23/17 2000 02/23/17 2154 02/24/17 0627  BP: (!) 120/99 (!) 147/97 140/89 116/83  Pulse: 67 65 80 63  Resp: (!) 24 15 20 19   Temp:   98.4 F (36.9 C) 98 F (36.7 C)  TempSrc:   Oral Oral  SpO2: 100% 100% 97%   Weight:   216 lb 12.8 oz (98.3 kg) 216 lb 8 oz (98.2 kg)  Height:   5\' 8"  (1.727 m)     Intake/Output Summary (Last 24 hours) at 02/24/17 0751 Last data filed at 02/24/17 0540  Gross per 24 hour  Intake            538.2 ml  Output                0 ml  Net            538.2 ml   Filed Weights   02/23/17 2154 02/24/17 0627  Weight: 216 lb 12.8 oz (98.3 kg) 216 lb 8 oz (98.2 kg)    Telemetry    NSR - Personally Reviewed  ECG    NSR 67 bpm - Personally Reviewed  Physical Exam   GEN: No acute distress. Moderately obese  Neck: No JVD Cardiac: RRR, no murmurs, rubs, or gallops.  Respiratory: Clear to auscultation bilaterally. GI: Soft, nontender, non-distended  MS: No edema; No deformity. Neuro:  Nonfocal    Psych: Normal affect   Labs    Chemistry Recent Labs Lab 02/17/17 1344 02/23/17 1810 02/23/17 2227 02/24/17 0336  NA 140 139  --  139  K 3.9 3.5  --  3.6  CL 104 104  --  105  CO2 28 23  --  26  GLUCOSE 92 88  --  128*  BUN 18 13  --  15  CREATININE 0.80 0.73 0.81 0.85  CALCIUM 10.0 10.2  --  9.8  PROT  --   --  6.5  --   ALBUMIN  --   --  3.8  --   AST  --   --  20  --   ALT  --   --  17  --   ALKPHOS  --   --  64  --   BILITOT  --   --  0.7  --   GFRNONAA 86 >60 >60 >60  GFRAA >89 >60 >60 >60  ANIONGAP  --  12  --  8  Hematology Recent Labs Lab 02/23/17 1810 02/23/17 2227 02/24/17 0336  WBC 7.2 7.2 6.6  RBC 4.67 4.59 4.30  HGB 13.0 12.6 12.0  HCT 40.2 39.8 37.3  MCV 86.1 86.7 86.7  MCH 27.8 27.5 27.9  MCHC 32.3 31.7 32.2  RDW 13.3 13.4 13.4  PLT 277 287 261    Cardiac Enzymes Recent Labs Lab 02/23/17 2227 02/24/17 0336  TROPONINI <0.03 0.08*    Recent Labs Lab 02/23/17 1817  TROPIPOC 0.00     BNPNo results for input(s): BNP, PROBNP in the last 168 hours.   DDimer No results for input(s): DDIMER in the last 168 hours.   Radiology    Dg Chest 2 View  Result Date: 02/23/2017 CLINICAL DATA:  Chest pain and shortness of breath. EXAM: CHEST  2 VIEW COMPARISON:  12/29/2016 FINDINGS: Cardiomediastinal silhouette is normal. Mediastinal contours appear intact. There is no evidence of focal airspace consolidation, pleural effusion or pneumothorax. Osseous structures are without acute abnormality. Soft tissues are grossly normal. IMPRESSION: No active cardiopulmonary disease. Electronically Signed   By: Fidela Salisbury M.D.   On: 02/23/2017 19:06    Cardiac Studies   NST 02/03/17 Study Highlights     The left ventricular ejection fraction is normal (55-65%).  Nuclear stress EF: 64%.  No T wave inversion was noted during stress.  There was no ST segment deviation noted during stress.  Defect 1: There is a medium defect of moderate  severity.  Findings consistent with ischemia.  This is an intermediate risk study.   Medium size, moderate intensity (SDS 5) reversible inferior/inferoseptal perfusion defect suggestive of ischemia. Extensive bowel uptake and mild RV uptake noted. LVEF 64% with normal wall motion. This is an intermediate risk study.      Patient Profile     51 y.o. female with recent intermediate risk NST, initially scheduled for elective outpatient cath 02/26/17, who presented to the ED 6/4 with worsening CP. Admitted last night with plans to perform LHC today.  Troponin mildly abnormal at 0.08.   Assessment & Plan    1. ACS: pt admitted for unstable angina and recent intermediate risk NST showing medium sized moderate intensity reversible inferior/inferoseptal perfusion defect suggestive of ischemia. Her first 2 troponins were negative, however 3rd troponin at 0330 was mildly elevated at 0.08. Will repeat troponin. If significant bump, will classify as NSTEMI. Plan is for definitive LHC today +/- PCI. Continue ASA, high intensity statin and metoprolol.   2. DLD: lipid panel shows elevated TG at 169. LDL 96. She is on Lipitor 80 mg, which is new. If CAD is confirmed on cath, LDL goal will be < 70 mg/dL. Recheck FLP and HFTs in 6 weeks. If not at goal, add Zetia.   Signed, Lyda Jester, PA-C  02/24/2017, 7:51 AM

## 2017-02-24 NOTE — Discharge Summary (Signed)
Discharge Summary    Patient ID: [name],  MRN: [id number], DOB/AGE: 12-29-1965 51 y.o.  Admit date: 02/23/2017 Discharge date: 02/24/2017  Primary Care Provider: Sela Hua Primary Cardiologist: Dr. 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 Latex Swelling    Facial and throat swelling from latex gloves  . Other Shortness Of Breath    "ink" on newspapers, catalogues, phone book causes shortness of breath  . Adhesive [Tape] Itching and Rash    Please use paper tape  . Septra [Bactrim] Hives and Rash    Diagnostic Studies/Procedures    NST 02/03/17 Study Highlights     The left ventricular ejection fraction is normal (55-65%).  Nuclear stress EF: 64%.  No T wave inversion was noted during stress.  There was no ST segment deviation noted during stress.  Defect 1: There is a medium defect of moderate severity.  Findings consistent with ischemia.  This is an intermediate risk study.  Medium size, moderate intensity (SDS 5) reversible inferior/inferoseptal perfusion defect suggestive of ischemia. Extensive bowel uptake and mild RV uptake noted. LVEF 64% with normal wall motion. This is an intermediate risk study.    LHC 02/24/17   Procedures   Left Heart Cath and Coronary Angiography  Conclusion     Mid RCA lesion, 10 %stenosed.  Ost 2nd Mrg to 2nd Mrg lesion, 20 %stenosed.  Ost LAD to Mid LAD lesion, 10 %stenosed.  Ost 1st Sept lesion, 40 %stenosed.  The left ventricular systolic function is normal.  LV end diastolic pressure is normal.  The left ventricular ejection fraction is greater than 65% by visual estimate.  There is no mitral valve regurgitation.   1. Mild non-obstructive CAD 2. Normal LV systolic  function  Recommendations: NO further ischemic workup. Medical management of CAD with ASA and statin.       History of Present Illness     51 y/o moderately obese female with h/o OSA, HTN, anxiety and recent CP worrisome for angina + palpitations. Also recent intermediate risk outpatient NST 02/03/17 showing medium sized moderate intensity reversible inferior/inferoseptal perfusion defect suggestive of ischemia. Admitted 02/23/17 for worsening CP.   Hospital Course     Pt admitted to telemetry. No athymias noted. EKG nonischemic. Cardiac enzymes were negative x 2 but 3rd troponin mildly abnormal at 0.08. Definitive LHC was recommended. Procedure was performed by Dr. Angelena Form on 02/24/17. Access was obtained via the right radial artery. She was found to have mild non-obstructive CAD on cath. EF was normal by visual estimate at 65%. No indication for PCI (see angiographic details above). Continued medical therapy was recommended. Her CP was determined non cardiac. It was felt that her symptoms were likely due to her OSA. Many of her palpitations and chest pain occur while sleeping.  She reports fatigue, chest pressure and daytime somnolence. Also awakens gasping for air. She has a sleep study pending and she has been instructed to continue with this w/u.   She was monitored post cath. There were no complications. Right radial cath site remained stable. She was continued on ASA, BB and statin. Lipitor dose was reduced down from 40 mg to 20 mg (LDL 96 mg/dL). She was last seen and examined by Dr. Oval Linsey who determined she was  stable for discharge home. She will f/u with an [facility] post hospital.      Consultants: none      Discharge Vitals Blood pressure 118/83, pulse (!) 0, temperature 98 F (36.7 C), temperature source Oral, resp. rate (!) 0, height 5\' 8"  (1.727 m), weight 216 lb 8 oz (98.2 kg), last menstrual period 10/06/2013, SpO2 (!) 0 %.  Filed Weights   02/23/17 2154 02/24/17 0627  Weight:  216 lb 12.8 oz (98.3 kg) 216 lb 8 oz (98.2 kg)    Labs & Radiologic Studies    CBC  Recent Labs  02/23/17 2227 02/24/17 0336  WBC 7.2 6.6  HGB 12.6 12.0  HCT 39.8 37.3  MCV 86.7 86.7  PLT 287 536   Basic Metabolic Panel  Recent Labs  02/23/17 1810 02/23/17 2227 02/24/17 0336  NA 139  --  139  K 3.5  --  3.6  CL 104  --  105  CO2 23  --  26  GLUCOSE 88  --  128*  BUN 13  --  15  CREATININE 0.73 0.81 0.85  CALCIUM 10.2  --  9.8   Liver Function Tests  Recent Labs  02/23/17 2227  AST 20  ALT 17  ALKPHOS 64  BILITOT 0.7  PROT 6.5  ALBUMIN 3.8   No results for input(s): LIPASE, AMYLASE in the last 72 hours. Cardiac Enzymes  Recent Labs  02/24/17 0336 02/24/17 0802 02/24/17 1007  TROPONINI 0.08* <0.03 <0.03   BNP Invalid input(s): POCBNP D-Dimer No results for input(s): DDIMER in the last 72 hours. Hemoglobin A1C No results for input(s): HGBA1C in the last 72 hours. Fasting Lipid Panel  Recent Labs  02/24/17 0336  CHOL 178  HDL 48  LDLCALC 96  TRIG 169*  CHOLHDL 3.7   Thyroid Function Tests  Recent Labs  02/23/17 2227  TSH 1.571   _____________  Dg Chest 2 View  Result Date: 02/23/2017 CLINICAL DATA:  Chest pain and shortness of breath. EXAM: CHEST  2 VIEW COMPARISON:  12/29/2016 FINDINGS: Cardiomediastinal silhouette is normal. Mediastinal contours appear intact. There is no evidence of focal airspace consolidation, pleural effusion or pneumothorax. Osseous structures are without acute abnormality. Soft tissues are grossly normal. IMPRESSION: No active cardiopulmonary disease. Electronically Signed   By: Fidela Salisbury M.D.   On: 02/23/2017 19:06   Disposition   Pt is being discharged home today in good condition.  Follow-up Plans & Appointments    Discharge Medications   Current Discharge Medication List    START taking these medications   Details  atorvastatin (LIPITOR) 20 MG tablet Take 1 tablet (20 mg total) by mouth  daily at 6 PM. Qty: 30 tablet, Refills: 5      CONTINUE these medications which have NOT CHANGED   Details  acetaminophen (TYLENOL) 500 MG tablet Take 1,000 mg by mouth daily as needed for moderate pain.    albuterol (PROVENTIL HFA;VENTOLIN HFA) 108 (90 Base) MCG/ACT inhaler Inhale 1-2 puffs into the lungs every 6 (six) hours as needed for wheezing or shortness of breath.    aspirin EC 81 MG tablet Take 81 mg by mouth daily.    busPIRone (BUSPAR) 7.5 MG tablet Take 1 tablet (7.5 mg total) by mouth 2 (two) times daily. Qty: 60 tablet, Refills: 1   Associated Diagnoses: Generalized anxiety disorder    cholecalciferol (VITAMIN D) 1000 units tablet Take 1,000 Units by mouth daily.    hydrochlorothiazide (HYDRODIURIL) 25 MG tablet TAKE ONE TABLET  BY MOUTH IN THE MORNING Qty: 30 tablet, Refills: 1   Associated Diagnoses: Essential hypertension, benign    losartan (COZAAR) 100 MG tablet Take 1 tablet (100 mg total) by mouth daily. Qty: 90 tablet, Refills: 1   Associated Diagnoses: Cough    metoprolol succinate (TOPROL-XL) 50 MG 24 hr tablet Take 1 tablet (50 mg total) by mouth daily. Qty: 30 tablet, Refills: 6   Associated Diagnoses: Atypical chest pain; Palpitations         Outstanding Labs/Studies   FLP and HFTs in 6 weeks.    Duration of Discharge Encounter   Greater than 30 minutes including physician time.  Signed, Lyda Jester PA-C 02/24/2017, 1:37 PM

## 2017-02-25 LAB — HEMOGLOBIN A1C
HEMOGLOBIN A1C: 6.1 % — AB (ref 4.8–5.6)
Mean Plasma Glucose: 128 mg/dL

## 2017-02-26 ENCOUNTER — Encounter (HOSPITAL_COMMUNITY): Admission: RE | Payer: Self-pay | Source: Ambulatory Visit

## 2017-02-26 ENCOUNTER — Ambulatory Visit (HOSPITAL_COMMUNITY)
Admission: RE | Admit: 2017-02-26 | Payer: BLUE CROSS/BLUE SHIELD | Source: Ambulatory Visit | Admitting: Cardiovascular Disease

## 2017-02-26 SURGERY — LEFT HEART CATH AND CORONARY ANGIOGRAPHY
Anesthesia: LOCAL

## 2017-03-02 ENCOUNTER — Telehealth: Payer: Self-pay | Admitting: Internal Medicine

## 2017-03-02 NOTE — Telephone Encounter (Signed)
finished

## 2017-03-03 ENCOUNTER — Telehealth: Payer: Self-pay | Admitting: Cardiovascular Disease

## 2017-03-03 ENCOUNTER — Ambulatory Visit (INDEPENDENT_AMBULATORY_CARE_PROVIDER_SITE_OTHER): Payer: BLUE CROSS/BLUE SHIELD | Admitting: Family Medicine

## 2017-03-03 ENCOUNTER — Encounter: Payer: Self-pay | Admitting: Family Medicine

## 2017-03-03 VITALS — BP 122/80 | HR 68 | Temp 98.1°F | Wt 220.0 lb

## 2017-03-03 DIAGNOSIS — E785 Hyperlipidemia, unspecified: Secondary | ICD-10-CM

## 2017-03-03 DIAGNOSIS — I200 Unstable angina: Secondary | ICD-10-CM

## 2017-03-03 DIAGNOSIS — R7303 Prediabetes: Secondary | ICD-10-CM

## 2017-03-03 DIAGNOSIS — M25531 Pain in right wrist: Secondary | ICD-10-CM

## 2017-03-03 NOTE — Telephone Encounter (Signed)
New message   Pt verbalized that she is calling because she is due   to go back to work and she needs provider to sign off

## 2017-03-03 NOTE — Telephone Encounter (Signed)
Returned call to patient.She stated she had cath done 02/24/17.PCP wanted her to get clearance to return to work by cardiology.Stated she has appointment with Dr.Berry 03/31/17,but would like to be seen sooner.Stated she is doing good cath site right wrist sore,no swelling.Appointment scheduled with Almyra Deforest 03/16/17 at 8:30 am.

## 2017-03-03 NOTE — Telephone Encounter (Signed)
Returned call to patient no answer.LMTC. 
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
## 2017-03-03 NOTE — Telephone Encounter (Signed)
Follow up   Pt is calling back for RN.

## 2017-03-03 NOTE — Patient Instructions (Signed)
It was nice seeing you today. I am glad you feel better. Regarding going back to work, I will recommend cardiology clearance for that. He the mean time please avoid lifting heavy objects >5 lbs due to your hand pain. F/U with [place] as needed and please start taking your Lipitor as well.

## 2017-03-04 ENCOUNTER — Ambulatory Visit: Payer: BLUE CROSS/BLUE SHIELD | Admitting: Internal Medicine

## 2017-03-06 ENCOUNTER — Ambulatory Visit (INDEPENDENT_AMBULATORY_CARE_PROVIDER_SITE_OTHER): Payer: BLUE CROSS/BLUE SHIELD | Admitting: Cardiovascular Disease

## 2017-03-06 ENCOUNTER — Encounter: Payer: Self-pay | Admitting: Cardiovascular Disease

## 2017-03-06 DIAGNOSIS — I10 Essential (primary) hypertension: Secondary | ICD-10-CM

## 2017-03-06 DIAGNOSIS — E785 Hyperlipidemia, unspecified: Secondary | ICD-10-CM | POA: Insufficient documentation

## 2017-03-06 DIAGNOSIS — E7800 Pure hypercholesterolemia, unspecified: Secondary | ICD-10-CM

## 2017-03-06 DIAGNOSIS — R0789 Other chest pain: Secondary | ICD-10-CM | POA: Diagnosis not present

## 2017-03-06 NOTE — Assessment & Plan Note (Signed)
History of essential hypertension blood pressure measured at 120/94. She is on Toprol, losartan and hydrochlorothiazide. Continue current meds at current dosing

## 2017-03-06 NOTE — Assessment & Plan Note (Signed)
History of atypical chest pain with a false positive Myoview 02/03/17 and assessment normal cardiac catheterization performed by Dr. Angelena Form  on 02/24/17. She no longer has chest pain.

## 2017-03-06 NOTE — Assessment & Plan Note (Signed)
History of hyperlipidemia on statin therapy with recent lipid profile performed 02/24/17 revealing a total cholesterol 170, LDL 96 and HDL of 48.

## 2017-03-06 NOTE — Patient Instructions (Signed)

## 2017-03-06 NOTE — Progress Notes (Signed)
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 She is referred by Armanda Heritage nurse practitioner for cardiovascular evaluation. She has seen Dr. Claudie Leach cardiologist, in the past as well. She's been seen for  evaluationof atypical chest pain and palpitations. She's had palpitations since her 64s which is gotten worse recently. They have been responsive to Valsalva maneuver in the past. She also complains of atypical chest pain. She had a 2-D echo which was normal and the monitor showed only PVCs. Lower extremity Dopplers are normal as well. She has cut out caffeine from her diet. Her the problems including true hypertension.  She had a Myoview tress test  Performed on 02/03/17 revealing inferior ischemia. She was admitted to the hospital on 02/23/17 with unstable angina. She underwent cardiac catheterization following day by Dr. Angelena Form  revealing remote coronary arteries and normal LV function. Her pain has since resolved as have her palpitations. She is scheduled for a sleep study later this month.    Current Outpatient Prescriptions  Medication Sig Dispense Refill  . acetaminophen (TYLENOL) 500 MG tablet Take 1,000 mg by mouth daily as needed for moderate pain.    Marland Kitchen albuterol (PROVENTIL HFA;VENTOLIN HFA) 108 (90 Base) MCG/ACT inhaler Inhale 1-2 puffs into the lungs every 6 (six) hours as needed for wheezing or shortness of breath.    Marland Kitchen aspirin EC 81 MG tablet Take 81 mg by mouth daily.    Marland Kitchen atorvastatin (LIPITOR) 20 MG tablet Take 1 tablet (20 mg total) by mouth daily at 6 PM. 30 tablet 5  . busPIRone (BUSPAR) 7.5 MG tablet Take 1 tablet (7.5 mg total) by mouth 2 (two) times daily. (Patient taking differently: Take 7.5  mg by mouth See admin instructions. Take 1 tablet (7.5 mg) by mouth every morning, may take an additional tablet later in the day as needed for heart palpitations (hold if SBP <150)) 60 tablet 1  . cholecalciferol (VITAMIN D) 1000 units tablet Take 1,000 Units by mouth daily.    . hydrochlorothiazide (HYDRODIURIL) 25 MG tablet TAKE ONE TABLET BY MOUTH IN THE MORNING (Patient taking differently: Take 25 mg by mouth daily. ) 30 tablet 1  . losartan (COZAAR) 100 MG tablet Take 1 tablet (100 mg total) by mouth daily. 90 tablet 1  . metoprolol succinate (TOPROL-XL) 50 MG 24 hr tablet Take 1 tablet (50 mg total) by mouth daily. 30 tablet 6   No current facility-administered medications for this visit.     Allergies  Allergen Reactions  . Latex Swelling    Facial and throat swelling from latex gloves  . Other Shortness Of Breath    "ink" on newspapers, catalogues, phone book causes shortness of breath  . Adhesive [Tape] Itching and Rash    Please use paper tape  . Septra [Bactrim] Hives and Rash    Social History   Social History  . Marital status: Widowed    Spouse name: N/A  . Number of children: N/A  . Years of education: N/A   Occupational History  . Not on file.   Social History Main Topics  . Smoking status: Never Smoker  . Smokeless tobacco: Never Used  . Alcohol use No  . Drug use:  No  . Sexual activity: Not Currently    Birth control/ protection: None   Other Topics Concern  . Not on file   Social History Narrative   Working towards being [employer].      Review of Systems: General: negative for chills, fever, night sweats or weight changes.  Cardiovascular: negative for chest pain, dyspnea on exertion, edema, orthopnea, palpitations, paroxysmal nocturnal dyspnea or shortness of breath Dermatological: negative for rash Respiratory: negative for cough or wheezing Urologic: negative for hematuria Abdominal: negative for nausea, vomiting, diarrhea, bright red blood per  rectum, melena, or hematemesis Neurologic: negative for visual changes, syncope, or dizziness All other systems reviewed and are otherwise negative except as noted above.    Blood pressure (!) 128/94, pulse 78, height 5\' 8"  (1.727 m), weight 220 lb (99.8 kg), last menstrual period 10/06/2013, SpO2 97 %.  General appearance: alert and no distress Neck: no adenopathy, no carotid bruit, no JVD, supple, symmetrical, trachea midline and thyroid not enlarged, symmetric, no tenderness/mass/nodules Lungs: clear to auscultation bilaterally Heart: regular rate and rhythm, S1, S2 normal, no murmur, click, rub or gallop Extremities: extremities normal, atraumatic, no cyanosis or edema  EKG not performed today  ASSESSMENT AND PLAN:   Essential hypertension, benign History of essential hypertension blood pressure measured at 120/94. She is on Toprol, losartan and hydrochlorothiazide. Continue current meds at current dosing  Atypical chest pain History of atypical chest pain with a false positive Myoview 02/03/17 and assessment normal cardiac catheterization performed by Dr. Angelena Form  on 02/24/17. She no longer has chest pain.  Hyperlipidemia History of hyperlipidemia on statin therapy with recent lipid profile performed 02/24/17 revealing a total cholesterol 170, LDL 96 and HDL of 48.      Lorretta Harp MD FACP,FACC,FAHA, [facility] 03/06/2017 10:50 AM

## 2017-03-16 ENCOUNTER — Ambulatory Visit: Payer: BLUE CROSS/BLUE SHIELD | Admitting: Internal Medicine

## 2017-03-16 ENCOUNTER — Ambulatory Visit: Payer: BLUE CROSS/BLUE SHIELD | Admitting: Physician Assistant

## 2017-03-19 ENCOUNTER — Ambulatory Visit (HOSPITAL_BASED_OUTPATIENT_CLINIC_OR_DEPARTMENT_OTHER): Payer: BLUE CROSS/BLUE SHIELD | Attending: Cardiovascular Disease | Admitting: Cardiovascular Disease

## 2017-03-19 VITALS — Ht 68.0 in | Wt 217.0 lb

## 2017-03-19 DIAGNOSIS — R0683 Snoring: Secondary | ICD-10-CM

## 2017-03-19 DIAGNOSIS — G4730 Sleep apnea, unspecified: Secondary | ICD-10-CM | POA: Diagnosis present

## 2017-03-19 DIAGNOSIS — R002 Palpitations: Secondary | ICD-10-CM | POA: Diagnosis present

## 2017-03-19 DIAGNOSIS — G4733 Obstructive sleep apnea (adult) (pediatric): Secondary | ICD-10-CM | POA: Diagnosis not present

## 2017-03-19 DIAGNOSIS — R0789 Other chest pain: Secondary | ICD-10-CM

## 2017-03-30 ENCOUNTER — Encounter (HOSPITAL_COMMUNITY): Payer: Self-pay

## 2017-03-30 ENCOUNTER — Emergency Department (HOSPITAL_COMMUNITY)
Admission: EM | Admit: 2017-03-30 | Discharge: 2017-03-30 | Disposition: A | Discharge status: Home / Self Care | Payer: BLUE CROSS/BLUE SHIELD | Attending: Emergency Medicine | Admitting: Emergency Medicine

## 2017-03-30 ENCOUNTER — Telehealth: Payer: Self-pay | Admitting: Gastroenterology

## 2017-03-30 ENCOUNTER — Emergency Department (HOSPITAL_COMMUNITY): Payer: BLUE CROSS/BLUE SHIELD

## 2017-03-30 DIAGNOSIS — R1031 Right lower quadrant pain: Secondary | ICD-10-CM | POA: Diagnosis present

## 2017-03-30 DIAGNOSIS — I10 Essential (primary) hypertension: Secondary | ICD-10-CM | POA: Diagnosis not present

## 2017-03-30 DIAGNOSIS — Z9861 Coronary angioplasty status: Secondary | ICD-10-CM | POA: Diagnosis not present

## 2017-03-30 DIAGNOSIS — Z79899 Other long term (current) drug therapy: Secondary | ICD-10-CM | POA: Diagnosis not present

## 2017-03-30 DIAGNOSIS — R109 Unspecified abdominal pain: Secondary | ICD-10-CM

## 2017-03-30 DIAGNOSIS — N760 Acute vaginitis: Secondary | ICD-10-CM | POA: Insufficient documentation

## 2017-03-30 DIAGNOSIS — Z87448 Personal history of other diseases of urinary system: Secondary | ICD-10-CM | POA: Insufficient documentation

## 2017-03-30 DIAGNOSIS — B9689 Other specified bacterial agents as the cause of diseases classified elsewhere: Secondary | ICD-10-CM

## 2017-03-30 HISTORY — DX: Other chest pain: R07.89

## 2017-03-30 LAB — COMPREHENSIVE METABOLIC PANEL
ALBUMIN: 4.5 g/dL (ref 3.5–5.0)
ALT: 21 U/L (ref 14–54)
AST: 23 U/L (ref 15–41)
Alkaline Phosphatase: 72 U/L (ref 38–126)
Anion gap: 9 (ref 5–15)
BUN: 14 mg/dL (ref 6–20)
CHLORIDE: 99 mmol/L — AB (ref 101–111)
CO2: 29 mmol/L (ref 22–32)
CREATININE: 0.75 mg/dL (ref 0.44–1.00)
Calcium: 10.3 mg/dL (ref 8.9–10.3)
GFR calc Af Amer: >60 mL/min (ref >60)
GFR calc non Af Amer: >60 mL/min (ref >60)
GLUCOSE: 122 mg/dL — AB (ref 65–99)
POTASSIUM: 3 mmol/L — AB (ref 3.5–5.1)
Sodium: 137 mmol/L (ref 135–145)
Total Bilirubin: 1.2 mg/dL (ref 0.3–1.2)
Total Protein: 7.9 g/dL (ref 6.5–8.1)

## 2017-03-30 LAB — URINALYSIS, ROUTINE W REFLEX MICROSCOPIC
Bilirubin Urine: NEGATIVE
GLUCOSE, UA: NEGATIVE mg/dL
HGB URINE DIPSTICK: NEGATIVE
KETONES UR: NEGATIVE mg/dL
Nitrite: NEGATIVE
PROTEIN: NEGATIVE mg/dL
Specific Gravity, Urine: 1.002 — ABNORMAL LOW (ref 1.005–1.030)
pH: 7 (ref 5.0–8.0)

## 2017-03-30 LAB — CBC WITH DIFFERENTIAL/PLATELET
BASOS ABS: 0 10*3/uL (ref 0.0–0.1)
BASOS PCT: 0 %
EOS PCT: 0 %
Eosinophils Absolute: 0 10*3/uL (ref 0.0–0.7)
HCT: 38.9 % (ref 36.0–46.0)
Hemoglobin: 12.8 g/dL (ref 12.0–15.0)
Lymphocytes Relative: 11 %
Lymphs Abs: 1.4 10*3/uL (ref 0.7–4.0)
MCH: 28.4 pg (ref 26.0–34.0)
MCHC: 32.9 g/dL (ref 30.0–36.0)
MCV: 86.4 fL (ref 78.0–100.0)
Monocytes Absolute: 0.7 10*3/uL (ref 0.1–1.0)
Monocytes Relative: 6 %
NEUTROS ABS: 10.5 10*3/uL — AB (ref 1.7–7.7)
Neutrophils Relative %: 83 %
PLATELETS: 306 10*3/uL (ref 150–400)
RBC: 4.5 MIL/uL (ref 3.87–5.11)
RDW: 13.7 % (ref 11.5–15.5)
WBC: 12.7 10*3/uL — AB (ref 4.0–10.5)

## 2017-03-30 LAB — WET PREP, GENITAL
SPERM: NONE SEEN
TRICH WET PREP: NONE SEEN
Yeast Wet Prep HPF POC: NONE SEEN

## 2017-03-30 LAB — LIPASE, BLOOD: Lipase: 21 U/L (ref 11–51)

## 2017-03-30 MED ORDER — MORPHINE SULFATE (PF) 4 MG/ML IV SOLN
4.0000 mg | Freq: Once | INTRAVENOUS | Status: AC
  Administered 2017-03-30: 4 mg via INTRAVENOUS
  Filled 2017-03-30: qty 1

## 2017-03-30 MED ORDER — ONDANSETRON HCL 4 MG/2ML IJ SOLN
4.0000 mg | Freq: Once | INTRAMUSCULAR | Status: AC
  Administered 2017-03-30: 4 mg via INTRAVENOUS
  Filled 2017-03-30: qty 2

## 2017-03-30 MED ORDER — METHOCARBAMOL 500 MG PO TABS: 500.0000 mg | ORAL_TABLET | Freq: Three times a day (TID) | ORAL | 0 refills | Status: DC

## 2017-03-30 MED ORDER — METRONIDAZOLE 500 MG PO TABS: 500.0000 mg | ORAL_TABLET | Freq: Two times a day (BID) | ORAL | 0 refills | Status: DC

## 2017-03-30 MED ORDER — KETOROLAC TROMETHAMINE 30 MG/ML IJ SOLN
30.0000 mg | Freq: Once | INTRAMUSCULAR | Status: AC
  Administered 2017-03-30: 30 mg via INTRAVENOUS
  Filled 2017-03-30: qty 1

## 2017-03-30 NOTE — ED Notes (Signed)
Pt is requesting to have IV taken out. Stated that if she got it taken out then she would not be able to get anymore IV pain medication. Stated, "I don't need anymore medication. I want it out now."

## 2017-03-30 NOTE — Telephone Encounter (Signed)
Yes

## 2017-03-30 NOTE — ED Triage Notes (Signed)
Pt c/o r flank pain for the past several months.  Reports nausea this morning, no vomiting.  Denies urinary symptoms.  PT says has had this pain evaluated but says they can't find anything wrong.

## 2017-03-30 NOTE — Telephone Encounter (Signed)
Pt was seen in the ER today and referred to follow up with [place]. Can we accept her as a new patient.

## 2017-03-30 NOTE — ED Notes (Signed)
Pt reports history of cysts to right kidney and right ovary.

## 2017-03-30 NOTE — ED Notes (Signed)
Prep is set up in room

## 2017-03-30 NOTE — Discharge Instructions (Signed)
You can contact Dr. Roseanne Kaufman office to schedule your screening colonoscopy.  Follow-up with your primary care doctor for recheck

## 2017-03-31 ENCOUNTER — Ambulatory Visit: Payer: BLUE CROSS/BLUE SHIELD | Admitting: Cardiovascular Disease

## 2017-03-31 ENCOUNTER — Encounter: Payer: Self-pay | Admitting: Internal Medicine

## 2017-03-31 LAB — GC/CHLAMYDIA PROBE AMP (~~LOC~~) NOT AT ARMC
CHLAMYDIA, DNA PROBE: NEGATIVE
NEISSERIA GONORRHEA: NEGATIVE

## 2017-03-31 NOTE — Telephone Encounter (Signed)
OV made and letter mailed °

## 2017-04-01 LAB — URINE CULTURE: Culture: NO GROWTH
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
## 2017-04-14 NOTE — Procedures (Signed)
Patient Name: Cassandra, Mckinney Date: 03/19/2017 Gender: Female D.O.B: Apr 29, 1966 Age (years): 51 Referring Provider: Lorretta Harp Height (inches): 71 Interpreting Physician: Shelva Majestic MD, ABSM Weight (lbs): 217 RPSGT: Baxter Flattery BMI: 33 MRN: [id number] Neck Size: 16.00  CLINICAL INFORMATION Sleep Study Type: NPSG  Indication for sleep study: Fatigue, Obesity, OSA, Snoring, Witnessed Apneas  Epworth Sleepiness Score: 14  SLEEP STUDY TECHNIQUE As per the AASM Manual for the Scoring of Sleep and Associated Events v2.3 (April 2016) with a hypopnea requiring 4% desaturations.  The channels recorded and monitored were frontal, central and occipital EEG, electrooculogram (EOG), submentalis EMG (chin), nasal and oral airflow, thoracic and abdominal wall motion, anterior tibialis EMG, snore microphone, electrocardiogram, and pulse oximetry.  MEDICATIONS acetaminophen (TYLENOL) 500 MG tablet Take 1,000 mg by mouth daily as needed for moderate pain. * albuterol (PROVENTIL HFA;VENTOLIN HFA) 108 (90 Base) MCG/ACT inhaler Inhale 1-2 puffs into the lungs every 6 (six) hours as needed for wheezing or shortness of breath.   * aspirin EC 81 MG tablet Take 81 mg by mouth daily.   * atorvastatin (LIPITOR) 20 MG tablet Take 1 tablet (20 mg total) by mouth daily at 6 PM. 30 tablet 5 * busPIRone (BUSPAR) 7.5 MG tablet Take 1 tablet (7.5 mg total) by mouth 2 (two) times daily. (Patient taking differently: Take 7.5 mg by mouth See admin instructions. Take 1 tablet (7.5 mg) by mouth every morning, may take an additional tablet later in the day as needed for heart palpitations (hold if SBP <150)) 60 tablet 1 * cholecalciferol (VITAMIN D) 1000 units tablet Take 1,000 Units by mouth daily.   * hydrochlorothiazide (HYDRODIURIL) 25 MG tablet TAKE ONE TABLET BY MOUTH IN THE MORNING (Patient taking differently: Take 25 mg by mouth daily. ) 30 tablet 1 * losartan (COZAAR) 100 MG tablet  Take 1 tablet (100 mg total) by mouth daily. 90 tablet 1 * metoprolol succinate (TOPROL-XL) 50 MG 24 hr tablet  Medications self-administered by patient taken the night of the study : N/A  SLEEP ARCHITECTURE The study was initiated at 10:18:32 PM and ended at 4:42:02 AM.  Sleep onset time was 47.2 minutes and the sleep efficiency was 77.2%. The total sleep time was 296.0 minutes.  Stage REM latency was 119.5 minutes.  The patient spent 6.59% of the night in stage N1 sleep, 59.80% in stage N2 sleep, 1.18% in stage N3 and 32.43% in REM.  Alpha intrusion was absent.  Supine sleep was 10.17%.  RESPIRATORY PARAMETERS The overall apnea/hypopnea index (AHI) was 6.5 per hour. There were 1 total apneas, including 1 obstructive, 0 central and 0 mixed apneas. There were 31 hypopneas and 0 RERAs.  The AHI during Stage REM sleep was 15.0 per hour.  AHI while supine was 2.0 per hour.  The mean oxygen saturation was 90.72%. The minimum SpO2 during sleep was 86.00%.  Loud snoring was noted during this study.  CARDIAC DATA The 2 lead EKG demonstrated sinus rhythm. The mean heart rate was 68.59 beats per minute. Other EKG findings include: None.  LEG MOVEMENT DATA The total PLMS were 11 with a resulting PLMS index of 2.23. Associated arousal with leg movement index was 0.0 .  IMPRESSIONS - Mild obstructive sleep apnea overall (AHI  6.5/h); however, sleep apnea was moderate during REM sleep (AHI 15/h). - No significant central sleep apnea occurred during this study (CAI = 0.0/h). - Mild oxygen desaturation to a nadir of 86%. - The patient snored with  Loud snoring volume. - No cardiac abnormalities were noted during this study. - Clinically significant periodic limb movements did not occur during sleep. No significant associated arousals.  DIAGNOSIS - Obstructive Sleep Apnea (327.23 [G47.33 ICD-10]) - Nocturnal Hypoxemia (327.26 [G47.36 ICD-10])  RECOMMENDATIONS - In this symptomatic  patient with cardiovascular comorbidities, loud snoring and excessive daytime sleepiness recommend CPAP titration study for effective treatment of her sleep-disordered breathing.  If patient is against CPAP initiation, consider alternatives to CPAP therapy, such as a customized oral appliance. - Effort should be made to optimize nasal and oral pharyngeal patency - Avoid alcohol, sedatives and other CNS depressants that may worsen sleep apnea and disrupt normal sleep architecture. - Sleep hygiene should be reviewed to assess factors that may improve sleep quality. - Weight management and regular exercise should be initiated or continued if appropriate.  [Electronically signed] 04/14/2017 09:10 PM  Shelva Majestic MD, [facility], ABSM Diplomate, American Board of Sleep Medicine   NPI: [id number] [place] PH: [phone]   FX: [phone] [place]

## 2017-04-15 ENCOUNTER — Telehealth: Payer: Self-pay | Admitting: *Deleted

## 2017-04-15 NOTE — Telephone Encounter (Signed)
New message    Pt tried to call back if you don t reach her she will call back at lunch time

## 2017-04-15 NOTE — Telephone Encounter (Signed)
Left message to call back  

## 2017-04-15 NOTE — Telephone Encounter (Signed)
Follow up  ° ° °Patient returning call back to nurse  °

## 2017-04-15 NOTE — Telephone Encounter (Signed)
-----   Message from Troy Sine, MD sent at 04/14/2017  9:14 PM EDT ----- Mariann Laster, please notify the patient the results of her sleep study.  She has mild sleep apnea.  Overall, but moderate with Rehm sleep.  Recommend CPAP titration in light of her symptomatic status.  If she is against CPAP therapy, consider referring for alternatives to CPAP such as customized dental appliance

## 2017-04-15 NOTE — Telephone Encounter (Signed)
Patient returning call for results, please call

## 2017-04-15 NOTE — Telephone Encounter (Signed)
Left message to call back 7/25

## 2017-04-16 NOTE — Telephone Encounter (Signed)
Left message to call back  

## 2017-05-04 ENCOUNTER — Encounter: Payer: Self-pay | Admitting: *Deleted

## 2017-05-04 NOTE — Telephone Encounter (Signed)
Attempt to call x 3, unable to reach or leave message.   "number cannot be completed as dialed".  No additional # listed on chart.   Mailed letter requesting patient to contact office to discuss sleep study result.

## 2017-05-08 ENCOUNTER — Ambulatory Visit: Payer: BLUE CROSS/BLUE SHIELD | Admitting: Gastroenterology

## 2017-05-08 ENCOUNTER — Encounter: Payer: Self-pay | Admitting: Gastroenterology

## 2017-05-08 ENCOUNTER — Telehealth: Payer: Self-pay | Admitting: Gastroenterology

## 2017-05-08 NOTE — Telephone Encounter (Signed)
PT WAS A NO SHOW AND LETTER SENT  °

## 2017-07-16 ENCOUNTER — Ambulatory Visit (INDEPENDENT_AMBULATORY_CARE_PROVIDER_SITE_OTHER): Payer: BLUE CROSS/BLUE SHIELD | Admitting: Family Medicine

## 2017-07-16 ENCOUNTER — Encounter: Payer: Self-pay | Admitting: Family Medicine

## 2017-07-16 VITALS — BP 128/80 | HR 84 | Temp 98.7°F | Wt 223.0 lb

## 2017-07-16 DIAGNOSIS — G43809 Other migraine, not intractable, without status migrainosus: Secondary | ICD-10-CM

## 2017-07-16 MED ORDER — KETOROLAC TROMETHAMINE 60 MG/2ML IM SOLN
60.0000 mg | Freq: Once | INTRAMUSCULAR | Status: AC
  Administered 2017-07-16: 60 mg via INTRAMUSCULAR
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
## 2017-07-16 NOTE — Patient Instructions (Signed)
It was great seeing you today! We have addressed the following issues today  1. You can take some ibuprofen and/or tylenol if the headaches continue.  If we did any lab work today, and the results require attention, either me or my nurse will get in touch with you. If everything is normal, you will get a letter in mail and a message via . If you don't hear from [place] in two weeks, please give [place] a call. Otherwise, we look forward to seeing you again at your next visit. If you have any questions or concerns before then, please call the clinic at [phone].  Please bring all your medications to every doctors visit  Sign up for My Chart to have easy access to your labs results, and communication with your Primary care physician. Please ask Front Desk for some assistance.   Please check-out at the front desk before leaving the clinic.    Take Care,   Dr. Andy Gauss  Migraine Headache A migraine headache is a very strong throbbing pain on one side or both sides of your head. Migraines can also cause other symptoms. Talk with your doctor about what things may bring on (trigger) your migraine headaches. Follow these instructions at home: Medicines  Take over-the-counter and prescription medicines only as told by your doctor.  Do not drive or use heavy machinery while taking prescription pain medicine.  To prevent or treat constipation while you are taking prescription pain medicine, your doctor may recommend that you: ? Drink enough fluid to keep your pee (urine) clear or pale yellow. ? Take over-the-counter or prescription medicines. ? Eat foods that are high in fiber. These include fresh fruits and vegetables, whole grains, and beans. ? Limit foods that are high in fat and processed sugars. These include fried and sweet foods. Lifestyle  Avoid alcohol.  Do not use any products that contain nicotine or tobacco, such as cigarettes and e-cigarettes. If you need help quitting, ask your  doctor.  Get at least 8 hours of sleep every night.  Limit your stress. General instructions   Keep a journal to find out what may bring on your migraines. For example, write down: ? What you eat and drink. ? How much sleep you get. ? Any change in what you eat or drink. ? Any change in your medicines.  If you have a migraine: ? Avoid things that make your symptoms worse, such as bright lights. ? It may help to lie down in a dark, quiet room. ? Do not drive or use heavy machinery. ? Ask your doctor what activities are safe for you.  Keep all follow-up visits as told by your doctor. This is important. Contact a doctor if:  You get a migraine that is different or worse than your usual migraines. Get help right away if:  Your migraine gets very bad.  You have a fever.  You have a stiff neck.  You have trouble seeing.  Your muscles feel weak or like you cannot control them.  You start to lose your balance a lot.  You start to have trouble walking.  You pass out (faint). This information is not intended to replace advice given to you by your health care provider. Make sure you discuss any questions you have with your health care provider. Document Released: 06/17/2008 Document Revised: 03/28/2016 Document Reviewed: 02/25/2016 Elsevier Interactive Patient Education  2017 [employer].

## 2017-08-12 ENCOUNTER — Ambulatory Visit (INDEPENDENT_AMBULATORY_CARE_PROVIDER_SITE_OTHER): Payer: Managed Care, Other (non HMO) | Admitting: Internal Medicine

## 2017-08-12 ENCOUNTER — Encounter: Payer: Self-pay | Admitting: Internal Medicine

## 2017-08-12 ENCOUNTER — Other Ambulatory Visit: Payer: Self-pay

## 2017-08-12 ENCOUNTER — Other Ambulatory Visit (HOSPITAL_COMMUNITY)
Admission: RE | Admit: 2017-08-12 | Discharge: 2017-08-12 | Disposition: A | Discharge status: Home / Self Care | Payer: Managed Care, Other (non HMO) | Source: Ambulatory Visit | Attending: Family Medicine | Admitting: Family Medicine

## 2017-08-12 VITALS — BP 100/70 | HR 72 | Temp 98.3°F | Wt 216.0 lb

## 2017-08-12 DIAGNOSIS — Z1231 Encounter for screening mammogram for malignant neoplasm of breast: Secondary | ICD-10-CM | POA: Diagnosis not present

## 2017-08-12 DIAGNOSIS — Z124 Encounter for screening for malignant neoplasm of cervix: Secondary | ICD-10-CM | POA: Insufficient documentation

## 2017-08-12 DIAGNOSIS — N83209 Unspecified ovarian cyst, unspecified side: Secondary | ICD-10-CM | POA: Diagnosis not present

## 2017-08-12 DIAGNOSIS — Z1239 Encounter for other screening for malignant neoplasm of breast: Secondary | ICD-10-CM

## 2017-08-12 NOTE — Progress Notes (Signed)
   [facility] Phone: [phone]  Subjective:  [name] is a 51 year old female presenting to clinic for a pap and to discuss her ovarian cyst.  Pap: Patient has never had an abnormal pap before. No vaginal discharge. Not interested in STD testing.  Ovarian Cyst: She recently had a CT renal stone study 03/2017 for right sided flank pain. The study showed a simple-appearing left ovarian cyst measuring 5cm in diameter that has showed slow enlargement since 2012 when it measured 2.7cm. Radiology suggested ultrasound to correlate with previous US study of 2015.  ROS: See HPI for pertinent positives and negatives  Past Medical History- HTN, hx unstable angina, CAD, obesity, HLD  Family history reviewed for today's visit. No changes.  Social history- patient is a never smoker. Works as a Glass blower/designer.   Objective: BP 100/70   Pulse 72   Temp 98.3 F (36.8 C) (Oral)   Wt 216 lb (98 kg)   LMP 10/06/2013   SpO2 99%   BMI 32.84 kg/m  Gen: NAD, alert, cooperative with exam GU: External genitalia normal in appearance. Vaginal walls normal. Cervix normal. No cervical motion tenderness. Bimanual exam without masses or tenderness.  Assessment/Plan: Encounter for cervical cancer screening: - Pap performed today  Ovarian Cyst: Pelvic US 2010 showed 2.3cm cyst in the left ovary. Pelvic US 2015 with 4.2cm x 2.8cm x 3.6cm in the left ovary. CT abdomen 2017 with 4.4cm left ovarian cyst. CT renal stone study 03/2017 with 5cm cyst of the left ovary and recommended [place] to correlate with previous US study. - Pelvic US ordered   Hyman Bible, MD PGY-3

## 2017-08-12 NOTE — Patient Instructions (Addendum)
It was so nice to meet you!  I will call you with your pap results.  Please call to schedule a mammogram.  We have scheduled a pelvic ultrasound to follow-up your ovarian cyst.  -Dr. Brett Albino

## 2017-08-12 NOTE — Assessment & Plan Note (Signed)
Pelvic US 2010 showed 2.3cm cyst in the left ovary. Pelvic US 2015 with 4.2cm x 2.8cm x 3.6cm in the left ovary. CT abdomen 2017 with 4.4cm left ovarian cyst. CT renal stone study 03/2017 with 5cm cyst of the left ovary and recommended [place] to correlate with previous US study. - Pelvic US ordered

## 2017-08-17 LAB — CYTOLOGY - PAP
Chlamydia: NEGATIVE
Diagnosis: NEGATIVE
HPV: DETECTED — AB
Neisseria Gonorrhea: NEGATIVE
Trichomonas: NEGATIVE

## 2017-08-18 ENCOUNTER — Telehealth: Payer: Self-pay | Admitting: Internal Medicine

## 2017-08-18 NOTE — Telephone Encounter (Signed)
Pt returned call and she would like to speak with her MD directly.

## 2017-08-18 NOTE — Telephone Encounter (Signed)
Called patient to discuss her pap smear results with her. Had to leave a voicemail, asking her to call us back. 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 If she calls back, please get her scheduled for a colposcopy whenever she is available. I am also happy to return to call to discuss this further and to answer any questions that she has.  Hyman Bible, MD PGY-3

## 2017-08-18 NOTE — Telephone Encounter (Signed)
Pt called back. She would like to talk to dr Brett Albino regarding the procedures she is suppse to have.

## 2017-08-19 ENCOUNTER — Encounter: Payer: Self-pay | Admitting: Internal Medicine

## 2017-08-19 ENCOUNTER — Ambulatory Visit (HOSPITAL_COMMUNITY)
Admission: RE | Admit: 2017-08-19 | Discharge: 2017-08-19 | Disposition: A | Discharge status: Home / Self Care | Payer: Managed Care, Other (non HMO) | Source: Ambulatory Visit | Attending: Family Medicine | Admitting: Family Medicine

## 2017-08-19 DIAGNOSIS — N83202 Unspecified ovarian cyst, left side: Secondary | ICD-10-CM | POA: Diagnosis not present

## 2017-08-19 DIAGNOSIS — N83209 Unspecified ovarian cyst, unspecified side: Secondary | ICD-10-CM | POA: Diagnosis present

## 2017-08-19 NOTE — Telephone Encounter (Signed)
Patient came into clinic. We discussed her pap results in person.

## 2017-08-20 ENCOUNTER — Telehealth: Payer: Self-pay | Admitting: Internal Medicine

## 2017-08-20 NOTE — Telephone Encounter (Signed)
Called patient to discuss the results of her pelvic ultrasound. Her left ovarian cyst has continued to grow since 2015. We will need to check some tumor markers, so I have placed an order for these. She should schedule a lab appointment to have these done. I have also sent in a referral to the gynecologist for further management. Would be happy to discuss this more over the phone with patient when she calls back.  Hyman Bible, MD

## 2017-08-21 ENCOUNTER — Other Ambulatory Visit: Payer: Self-pay | Admitting: Internal Medicine

## 2017-08-21 ENCOUNTER — Other Ambulatory Visit: Payer: Managed Care, Other (non HMO)

## 2017-08-21 DIAGNOSIS — N83209 Unspecified ovarian cyst, unspecified side: Secondary | ICD-10-CM

## 2017-08-21 NOTE — Telephone Encounter (Signed)
Patient came into the office to get results of her ultrasound.  Explained to patient that her cyst had changed and that we would need further labs to rule out causes.  Patient voiced understanding somewhat but would still like to speak with provider.  She did get her labs drawn while she was in clinic today.  Will forward to MD to call patient at work [phone] she is at [employer].  She will be there until about 6:30pm today and works from 8:30am-5:30pm on Monday.  Jazmin Hartsell,CMA

## 2017-08-21 NOTE — Telephone Encounter (Signed)
LM for patient to call back.  When she does please call back to clinic so we can speak with her.  Thanks [employer]

## 2017-08-21 NOTE — Telephone Encounter (Signed)
LM on nurse line. Patient is returning call of provider. Sani Loiseau,CMA

## 2017-08-21 NOTE — Telephone Encounter (Signed)
Returned patient's call. Had to leave another voicemail.

## 2017-08-21 NOTE — Telephone Encounter (Signed)
Pt returned call

## 2017-08-22 LAB — CEA: CEA: 0.7 ng/mL (ref 0.0–4.7)

## 2017-08-22 LAB — CA 125: CANCER ANTIGEN (CA) 125: 7.2 U/mL (ref 0.0–38.1)

## 2017-08-24 ENCOUNTER — Telehealth: Payer: Self-pay | Admitting: Internal Medicine

## 2017-08-24 ENCOUNTER — Other Ambulatory Visit: Payer: Self-pay | Admitting: Internal Medicine

## 2017-08-24 NOTE — Telephone Encounter (Signed)
Form completed and given to Pleak.

## 2017-08-24 NOTE — Telephone Encounter (Signed)
Form filled out. Also called patient at work and on her cell phone to discuss her labs from Friday. Patient was unavailable at work and I left a voicemail on her cell phone. Her tumor marker labs were negative, so I do not think this is an ovarian cancer. It is likely just a benign cyst. She should still follow-up with gynecology. That referral is pending.  Hyman Bible, MD PGY-3

## 2017-08-24 NOTE — Telephone Encounter (Signed)
Clinical info completed on FMLA form.  Place form in Dr. Velia Meyer box for completion.  [name], [name]

## 2017-08-24 NOTE — Telephone Encounter (Signed)
FMLA form dropped off for at front desk for completion.  Verified that patient section of form has been completed.  Last DOS/WCC with PCP was 08/12/17.  Placed form in team folder to be completed by clinical staff.  Crista Luria

## 2017-08-25 NOTE — Telephone Encounter (Signed)
LM that form was ready for pickup. Fleeger, Salome Spotted, CMA

## 2017-08-27 ENCOUNTER — Encounter: Payer: Self-pay | Admitting: Family Medicine

## 2017-08-27 ENCOUNTER — Ambulatory Visit (INDEPENDENT_AMBULATORY_CARE_PROVIDER_SITE_OTHER): Payer: Managed Care, Other (non HMO) | Admitting: Family Medicine

## 2017-08-27 ENCOUNTER — Other Ambulatory Visit: Payer: Self-pay

## 2017-08-27 VITALS — BP 118/83 | HR 52 | Temp 99.1°F | Wt 223.0 lb

## 2017-08-27 DIAGNOSIS — N83209 Unspecified ovarian cyst, unspecified side: Secondary | ICD-10-CM | POA: Diagnosis not present

## 2017-08-27 DIAGNOSIS — B977 Papillomavirus as the cause of diseases classified elsewhere: Secondary | ICD-10-CM

## 2017-08-27 DIAGNOSIS — N72 Inflammatory disease of cervix uteri: Secondary | ICD-10-CM | POA: Diagnosis not present

## 2017-08-27 HISTORY — DX: Papillomavirus as the cause of diseases classified elsewhere: B97.7

## 2017-08-27 NOTE — Assessment & Plan Note (Signed)
cotest pap one year

## 2017-08-27 NOTE — Assessment & Plan Note (Signed)
appt made [facility]

## 2017-08-27 NOTE — Progress Notes (Signed)
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  Notably patient also has history of left ovarian cysts since 2015.  Ultrasound August 19, 2017 showed size of the ovary 5.5 x 5.3 x 5.5 with cyst 4.8 x 4.9 x 4.9 which is an increase from previous measurement of 4.2 x 2.8 x 3.6.  No definite nodularity or septations were seen on the ultrasound.  Right ovary was not visualized on either transabdominal pelvic ultrasound for prior CT.  PERTINENT  PMH / PSH: I have reviewed the patient's medications, allergies, past medical and surgical history, smoking status.  Pertinent findings that relate to today's visit / issues include: History of atypical chest pain with a false positive Myoview 02/03/17 and assessment normal cardiac catheterization performed by Dr. Angelena Form  on 02/24/17. Postmenopausal since age 57. Previously used Depo provera. [id number] Pap 07/2017: Adequacy Reason Satisfactory for evaluation, endocervical/transformation zone component PRESENT. Diagnosis NEGATIVE FOR INTRAEPITHELIAL LESIONS OR MALIGNANCY. Hi Risk HPV detected  Patient given informed consent, signed copy in the chart.  Placed in lithotomy position. Cervix viewed with speculum and colposcope after application of acetic acid.   GU externally normal Cervix grossly normal Small amount of cervical mucous, normal appearing.  Colposcopy adequate (entire squamocolumnar junctions seen  in entirety) ?  yes Acetowhite lesions? none Punctation? no Mosaicism?  no Abnormal vasculature?  no Biopsies? none ECC? no Complications? no  COMMENTS:  Patient was given post procedure instructions.  1. Abnormal PAP: negative with hi risk HPV presence Clinically normal colposcopy.  Pap with cotesting in one year. 2. Adnexal cyst--chronic since 2015. Some abdonminal discomfort.  She did not want to go back to [facility] so we changed her appointment : We have made her GYN appt for her in [place] and  she is aware---this is for f/u adnexal mass

## 2017-09-07 ENCOUNTER — Other Ambulatory Visit: Payer: Self-pay

## 2017-09-07 ENCOUNTER — Encounter: Payer: Self-pay | Admitting: Obstetrics & Gynecology

## 2017-09-07 ENCOUNTER — Ambulatory Visit (INDEPENDENT_AMBULATORY_CARE_PROVIDER_SITE_OTHER): Payer: Managed Care, Other (non HMO) | Admitting: Obstetrics & Gynecology

## 2017-09-07 VITALS — BP 120/78 | HR 82 | Ht 68.0 in | Wt 223.0 lb

## 2017-09-07 DIAGNOSIS — D271 Benign neoplasm of left ovary: Secondary | ICD-10-CM | POA: Diagnosis not present

## 2017-09-07 MED ORDER — KETOROLAC TROMETHAMINE 10 MG PO TABS: 10.0000 mg | ORAL_TABLET | Freq: Three times a day (TID) | ORAL | 0 refills | Status: DC | PRN
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
## 2017-09-28 ENCOUNTER — Ambulatory Visit: Payer: Managed Care, Other (non HMO) | Admitting: Internal Medicine

## 2017-09-29 ENCOUNTER — Other Ambulatory Visit: Payer: Self-pay

## 2017-09-29 ENCOUNTER — Encounter: Payer: Self-pay | Admitting: Family Medicine

## 2017-09-29 ENCOUNTER — Ambulatory Visit (INDEPENDENT_AMBULATORY_CARE_PROVIDER_SITE_OTHER): Payer: Managed Care, Other (non HMO) | Admitting: Family Medicine

## 2017-09-29 VITALS — BP 108/66 | HR 68 | Temp 97.8°F | Ht 68.0 in | Wt 225.6 lb

## 2017-09-29 DIAGNOSIS — N83201 Unspecified ovarian cyst, right side: Secondary | ICD-10-CM | POA: Diagnosis not present

## 2017-09-29 MED ORDER — ONDANSETRON HCL 4 MG PO TABS: 4.0000 mg | ORAL_TABLET | Freq: Three times a day (TID) | ORAL | 0 refills | Status: AC | PRN

## 2017-09-29 NOTE — Patient Instructions (Signed)
It was a pleasure to see you today! Thank you for choosing [facility] for your primary care. [name] was seen for right ovain cyst pain and work paperwork. You can take zofran for nausea from pain medication. Come back to the clinic if pain worsens or you are unable to keep anything down by mouth.   Best,  Marny Lowenstein, MD, [place] - PGY1 09/29/2017 11:29 AM

## 2017-09-29 NOTE — Assessment & Plan Note (Signed)
Patient has persistant pain from her cyst. Although, right side was hurting her today and left ovary is the one being surgically removed. Since pain meds are Rx by surgeon, I will have pt continue taking them without changing regimen. I will rx zofran for nausea. Since pt has FMLA paperwork completed and surgery upcoming in less than 1 week. I will write note for patient to have excuse up until surgery. Anything after this needs to be done with surgeon oversight and PCP.

## 2017-09-29 NOTE — Progress Notes (Signed)
    Subjective:  [name] is a 52 y.o. female who presents to the [facility] today with pain from ovarian cyst.   HPI:  Benign, Left Ovarian Cyst  Chronic, worsening pain. Scheduled to have surgically removed on 10/06/2016 by Dr. Elonda Husky. Patient says she has pain daily. Not able to work on and take her pain meds. Pain is so bad she becomes nauseous and vomits up pain meds. When she does take pain meds, usually ibuprofen or hydrocodone, she feels unsafe at work and sleepy. Patient did not take any pains meds today. She has recently had FMLA paperwork filled out by Dr. Brett Albino. Needs a work note until surgery on Monday.   Objective:  Physical Exam: BP 108/66   Pulse 68   Temp 97.8 F (36.6 C) (Oral)   Ht 5\' 8"  (1.727 m)   Wt 225 lb 9.6 oz (102.3 kg)   LMP 10/06/2013   SpO2 98%   BMI 34.30 kg/m   Gen: NAD, resting comfortably CV: RRR with no murmurs appreciated Pulm: NWOB, CTAB with no crackles, wheezes, or rhonchi GI: Soft, Nondistended, right sided tenderness, no rebound or guarding MSK: no edema, cyanosis, or clubbing noted Skin: warm, dry Neuro: grossly normal, moves all extremities Psych: Normal affect and thought content  No results found for this or any previous visit (from the past 72 hour(s)).   Assessment/Plan:  Ovarian cyst Patient has persistant pain from her cyst. Although, right side was hurting her today and left ovary is the one being surgically removed. Since pain meds are Rx by surgeon, I will have pt continue taking them without changing regimen. I will rx zofran for nausea. Since pt has FMLA paperwork completed and surgery upcoming in less than 1 week. I will write note for patient to have excuse up until surgery. Anything after this needs to be done with surgeon oversight and PCP.    Lab Orders  No laboratory test(s) ordered today    Meds ordered this encounter  Medications  . ondansetron (ZOFRAN) 4 MG tablet    Sig: Take 1 tablet (4 mg total) by mouth  every 8 (eight) hours as needed for up to 14 days for nausea or vomiting.    Dispense:  20 tablet    Refill:  0    Cassandra Lowenstein, MD, MS FAMILY MEDICINE RESIDENT - PGY1 09/29/2017 1:46 PM

## 2017-10-06 ENCOUNTER — Encounter: Payer: Self-pay | Admitting: Obstetrics & Gynecology

## 2017-10-06 ENCOUNTER — Ambulatory Visit (INDEPENDENT_AMBULATORY_CARE_PROVIDER_SITE_OTHER): Payer: Managed Care, Other (non HMO) | Admitting: Obstetrics & Gynecology

## 2017-10-06 ENCOUNTER — Other Ambulatory Visit: Payer: Self-pay

## 2017-10-06 VITALS — BP 146/94 | HR 77 | Ht 68.0 in | Wt 230.0 lb

## 2017-10-06 DIAGNOSIS — R1032 Left lower quadrant pain: Secondary | ICD-10-CM | POA: Diagnosis not present

## 2017-10-06 DIAGNOSIS — R1031 Right lower quadrant pain: Secondary | ICD-10-CM | POA: Diagnosis not present

## 2017-10-06 DIAGNOSIS — D271 Benign neoplasm of left ovary: Secondary | ICD-10-CM | POA: Diagnosis not present
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
## 2017-10-08 ENCOUNTER — Telehealth: Payer: Self-pay | Admitting: Internal Medicine

## 2017-10-08 NOTE — Telephone Encounter (Signed)
Thank you :)

## 2017-10-08 NOTE — Telephone Encounter (Signed)
Patient informed that letter is ready for pick up.  Jazmin Hartsell,CMA  

## 2017-10-08 NOTE — Telephone Encounter (Signed)
Pt postponed her surgery. She needs a letter releasing her to go back to work with the effective date ASAP.  Please advise

## 2017-10-15 ENCOUNTER — Other Ambulatory Visit: Payer: Self-pay | Admitting: Obstetrics & Gynecology

## 2017-10-15 NOTE — Patient Instructions (Signed)
[name]  10/15/2017     @PREFPERIOPPHARMACY @   Your procedure is scheduled on 10/21/2017.  Report to [facility] at 6:15 A.M.  Call this number if you have problems the morning of surgery:  [phone]   Remember:  Do not eat food or drink liquids after midnight.  Take these medicines the morning of surgery with A SIP OF WATER Albuterol inhaler and bring with you, Buspar, Cozaar,Robaxin if needed, Toprol,  DO NOT TAKE TORADOL THAT AM, YOU WILL RECEIVE A 30 MG DOSE PRIOR TO SURGERY   Do not wear jewelry, make-up or nail polish.  Do not wear lotions, powders, or perfumes, or deodorant.  Do not shave 48 hours prior to surgery.  Men may shave face and neck.  Do not bring valuables to the hospital.  [facility] is not responsible for any belongings or valuables.  Contacts, dentures or bridgework may not be worn into surgery.  Leave your suitcase in the car.  After surgery it may be brought to your room.  For patients admitted to the hospital, discharge time will be determined by your treatment team.  Patients discharged the day of surgery will not be allowed to drive home.    Please read over the following fact sheets that you were given. Surgical Site Infection Prevention and Anesthesia Post-op Instructions     PATIENT INSTRUCTIONS POST-ANESTHESIA  IMMEDIATELY FOLLOWING SURGERY:  Do not drive or operate machinery for the first twenty four hours after surgery.  Do not make any important decisions for twenty four hours after surgery or while taking narcotic pain medications or sedatives.  If you develop intractable nausea and vomiting or a severe headache please notify your doctor immediately.  FOLLOW-UP:  Please make an appointment with your surgeon as instructed. You do not need to follow up with anesthesia unless specifically instructed to do so.  WOUND CARE INSTRUCTIONS (if applicable):  Keep a dry clean dressing on the anesthesia/puncture wound site if there is  drainage.  Once the wound has quit draining you may leave it open to air.  Generally you should leave the bandage intact for twenty four hours unless there is drainage.  If the epidural site drains for more than 36-48 hours please call the anesthesia department.  QUESTIONS?:  Please feel free to call your physician or the hospital operator if you have any questions, and they will be happy to assist you.      Bilateral Salpingo-Oophorectomy Bilateral salpingo-oophorectomy is the surgical removal of both fallopian tubes and both ovaries. The ovaries are reproductive organs that produce eggs in women. The fallopian tubes allow eggs to move from the ovaries to the uterus. You may need this procedure if you:  Have had your uterus removed. This procedure is usually done after the uterus is removed.  Have cancer of the fallopian tubes or ovaries.  Have a high risk of cancer of the fallopian tubes or ovaries.  There are three different techniques that can be used for this procedure:  Open. One large incision will be made in your abdomen.  Laparoscopic. A thin, lighted tube with a small camera on the end (laparoscope) will be used to help perform the procedure. The laparoscope will allow your surgeon to make several small incisions in the abdomen instead of one large incision.  Robot-assisted. A computer will be used to control surgical instruments that are attached to robotic arms. A laparoscope may also be used with this technique.  As a result  of this procedure, you will become sterile (unable to become pregnant), and you will go into menopause (no longer able to have menstrual periods). You may develop symptoms of menopause such as hot flashes, night sweats, and mood changes. Your sex drive may also be affected. Tell a health care provider about:  Any allergies you have.  All medicines you are taking, including vitamins, herbs, eye drops, creams, and over-the-counter medicines.  Any problems  you or family members have had with anesthetic medicines.  Any blood disorders you have.  Any surgeries you have had.  Any medical conditions you have.  Whether you are pregnant or may be pregnant. What are the risks? Generally, this is a safe procedure. However, problems may occur, including:  Infection.  Bleeding.  Allergic reactions to medicines.  Damage to other structures or organs.  Blood clots in the legs or lungs.  What happens before the procedure? Staying hydrated Follow instructions from your health care provider about hydration, which may include:  Up to 2 hours before the procedure - you may continue to drink clear liquids, such as water, clear fruit juice, black coffee, and plain tea.  Eating and drinking restrictions Follow instructions from your health care provider about eating and drinking, which may include:  8 hours before the procedure - stop eating heavy meals or foods such as meat, fried foods, or fatty foods.  6 hours before the procedure - stop eating light meals or foods, such as toast or cereal.  6 hours before the procedure - stop drinking milk or drinks that contain milk.  2 hours before the procedure - stop drinking clear liquids.  Medicines  Ask your health care provider about: ? Changing or stopping your regular medicines. This is especially important if you are taking diabetes medicines or blood thinners. ? Taking medicines such as aspirin and ibuprofen. These medicines can thin your blood. Do not take these medicines before your procedure if your health care provider instructs you not to.  You may be given antibiotic medicine to help prevent infection. General instructions  Do not smoke for at least 2 weeks before your procedure or as told by your health care provider.  You may have an exam or testing.  You may have a blood or urine sample taken.  Ask your health care provider how your surgical site will be marked or  identified.  Plan to have someone take you home from the hospital.  If you will be going home right after the procedure, plan to have someone with you for 24 hours. What happens during the procedure?  To reduce your risk of infection: ? Your health care team will wash or sanitize their hands. ? Your skin will be washed with soap. ? Hair may be removed from the surgical area.  An IV tube will be inserted into one of your veins.  You will be given one or more of the following: ? A medicine to help you relax (sedative). ? A medicine to make you fall asleep (general anesthetic).  A thin tube (catheter) will be inserted through your urethra and into your bladder. The catheter drains urine during your procedure.  Depending on the type of surgery you are having, your surgeon will do one of the following: ? Make one incision in your abdomen (open surgery). ? Make two small incisions in your abdomen (laparoscopic surgery). The laparoscope will be passed through one incision, and surgical instruments will be passed through the other. ? Make several  small incisions in your abdomen (robot-assisted surgery). A laparoscope and other surgical instruments may be passed through the incisions.  Your fallopian tubes and ovaries will be cut away from the uterus and removed.  Your blood vessels will be clamped and tied to prevent too much bleeding.  The incision(s) in your abdomen will be closed with stitches (sutures) or staples.  A bandage (dressing) may be placed over your incision(s). The procedure may vary among health care providers and hospitals. What happens after the procedure?  Your blood pressure, heart rate, breathing rate, and blood oxygen level will be monitored until the medicines you were given have worn off.  You may continue to receive fluids and medicines through an IV tube.  You may continue to have a catheter draining your urine.  You may have to wear compression stockings.  These stockings help to prevent blood clots and reduce swelling in your legs.  You will be given pain medicine as needed.  Do not drive for 24 hours if you received a sedative. Summary  Bilateral salpingo-oophorectomy is a procedure to remove both fallopian tubes and both ovaries.  There are three different techniques that can be used for this procedure, including open, laparoscopic, and robotic. Talk with your health care provider about how your procedure will be done.  As a result of this procedure, you will become sterile and you will go into menopause.  Plan to have someone take you home from the hospital. This information is not intended to replace advice given to you by your health care provider. Make sure you discuss any questions you have with your health care provider. Document Released: 09/08/2005 Document Revised: 10/13/2016 Document Reviewed: 10/13/2016 Elsevier Interactive Patient Education  [employer].

## 2017-10-16 ENCOUNTER — Encounter (HOSPITAL_COMMUNITY)
Admission: RE | Admit: 2017-10-16 | Discharge: 2017-10-16 | Disposition: A | Discharge status: Home / Self Care | Payer: Managed Care, Other (non HMO) | Source: Ambulatory Visit | Attending: Obstetrics & Gynecology | Admitting: Obstetrics & Gynecology

## 2017-10-16 ENCOUNTER — Encounter (HOSPITAL_COMMUNITY): Payer: Self-pay

## 2017-10-21 ENCOUNTER — Encounter (HOSPITAL_COMMUNITY): Admission: RE | Payer: Self-pay | Source: Ambulatory Visit

## 2017-10-21 ENCOUNTER — Ambulatory Visit (HOSPITAL_COMMUNITY)
Admission: RE | Admit: 2017-10-21 | Payer: Managed Care, Other (non HMO) | Source: Ambulatory Visit | Admitting: Obstetrics & Gynecology

## 2017-10-21 SURGERY — SALPINGO-OOPHORECTOMY, BILATERAL, LAPAROSCOPIC
Anesthesia: General | Laterality: Bilateral

## 2017-10-29 ENCOUNTER — Encounter: Payer: Managed Care, Other (non HMO) | Admitting: Obstetrics & Gynecology

## 2017-12-02 ENCOUNTER — Ambulatory Visit: Payer: Managed Care, Other (non HMO) | Admitting: Internal Medicine

## 2018-01-01 ENCOUNTER — Ambulatory Visit: Payer: Managed Care, Other (non HMO) | Admitting: Internal Medicine

## 2018-01-04 NOTE — Progress Notes (Deleted)
   [facility] Phone: [phone]   Date of Visit: 01/05/2018   HPI:  ***  ROS: See HPI.  [place]:  PMH: HTN CAD HLD Obesity  PHYSICAL EXAM: LMP 10/06/2013  Gen: *** HEENT: *** Heart: *** Lungs: *** Neuro: *** Ext: ***  ASSESSMENT/PLAN:  Health maintenance:  -***  No problem-specific Assessment & Plan notes found for this encounter.  FOLLOW UP: Follow up in *** for ***  Smiley Houseman, MD PGY [place]

## 2018-01-05 ENCOUNTER — Ambulatory Visit: Payer: Self-pay | Admitting: Internal Medicine

## 2018-01-26 ENCOUNTER — Emergency Department (HOSPITAL_COMMUNITY)
Admission: EM | Admit: 2018-01-26 | Discharge: 2018-01-26 | Disposition: A | Discharge status: Home / Self Care | Payer: Self-pay | Attending: Emergency Medicine | Admitting: Emergency Medicine

## 2018-01-26 ENCOUNTER — Other Ambulatory Visit: Payer: Self-pay

## 2018-01-26 ENCOUNTER — Encounter (HOSPITAL_COMMUNITY): Payer: Self-pay | Admitting: *Deleted

## 2018-01-26 ENCOUNTER — Emergency Department (HOSPITAL_COMMUNITY): Payer: Self-pay

## 2018-01-26 DIAGNOSIS — R1010 Upper abdominal pain, unspecified: Secondary | ICD-10-CM | POA: Insufficient documentation

## 2018-01-26 DIAGNOSIS — I2510 Atherosclerotic heart disease of native coronary artery without angina pectoris: Secondary | ICD-10-CM | POA: Insufficient documentation

## 2018-01-26 DIAGNOSIS — I10 Essential (primary) hypertension: Secondary | ICD-10-CM | POA: Insufficient documentation

## 2018-01-26 DIAGNOSIS — Z79899 Other long term (current) drug therapy: Secondary | ICD-10-CM | POA: Insufficient documentation

## 2018-01-26 DIAGNOSIS — Z7982 Long term (current) use of aspirin: Secondary | ICD-10-CM | POA: Insufficient documentation

## 2018-01-26 DIAGNOSIS — Z91040 Latex allergy status: Secondary | ICD-10-CM | POA: Insufficient documentation

## 2018-01-26 LAB — CBC
HCT: 39.2 % (ref 36.0–46.0)
HEMOGLOBIN: 12.5 g/dL (ref 12.0–15.0)
MCH: 28.2 pg (ref 26.0–34.0)
MCHC: 31.9 g/dL (ref 30.0–36.0)
MCV: 88.3 fL (ref 78.0–100.0)
Platelets: 306 10*3/uL (ref 150–400)
RBC: 4.44 MIL/uL (ref 3.87–5.11)
RDW: 13.8 % (ref 11.5–15.5)
WBC: 8.6 10*3/uL (ref 4.0–10.5)

## 2018-01-26 LAB — COMPREHENSIVE METABOLIC PANEL
ALBUMIN: 4 g/dL (ref 3.5–5.0)
ALK PHOS: 77 U/L (ref 38–126)
ALT: 19 U/L (ref 14–54)
ANION GAP: 10 (ref 5–15)
AST: 16 U/L (ref 15–41)
BUN: 15 mg/dL (ref 6–20)
CALCIUM: 10.2 mg/dL (ref 8.9–10.3)
CHLORIDE: 103 mmol/L (ref 101–111)
CO2: 28 mmol/L (ref 22–32)
Creatinine, Ser: 0.94 mg/dL (ref 0.44–1.00)
GFR calc Af Amer: >60 mL/min (ref >60)
GFR calc non Af Amer: >60 mL/min (ref >60)
GLUCOSE: 114 mg/dL — AB (ref 65–99)
Potassium: 3.2 mmol/L — ABNORMAL LOW (ref 3.5–5.1)
SODIUM: 141 mmol/L (ref 135–145)
Total Bilirubin: 0.7 mg/dL (ref 0.3–1.2)
Total Protein: 7.2 g/dL (ref 6.5–8.1)

## 2018-01-26 LAB — TROPONIN I

## 2018-01-26 LAB — LIPASE, BLOOD: LIPASE: 25 U/L (ref 11–51)

## 2018-01-26 MED ORDER — SUCRALFATE 1 G PO TABS: 1.0000 g | ORAL_TABLET | Freq: Three times a day (TID) | ORAL | 0 refills | Status: DC

## 2018-01-26 MED ORDER — FAMOTIDINE IN NACL 20-0.9 MG/50ML-% IV SOLN
20.0000 mg | Freq: Once | INTRAVENOUS | Status: AC
  Administered 2018-01-26: 20 mg via INTRAVENOUS
  Filled 2018-01-26: qty 50

## 2018-01-26 MED ORDER — IOPAMIDOL (ISOVUE-300) INJECTION 61%
100.0000 mL | Freq: Once | INTRAVENOUS | Status: AC | PRN
  Administered 2018-01-26: 100 mL via INTRAVENOUS

## 2018-01-26 MED ORDER — ONDANSETRON 4 MG PO TBDP
4.0000 mg | ORAL_TABLET | Freq: Once | ORAL | Status: AC
  Administered 2018-01-26: 4 mg via ORAL
  Filled 2018-01-26: qty 1

## 2018-01-26 MED ORDER — ONDANSETRON HCL 4 MG/2ML IJ SOLN: 4.0000 mg | Freq: Once | INTRAMUSCULAR | Status: DC

## 2018-01-26 MED ORDER — OMEPRAZOLE 20 MG PO CPDR: 20.0000 mg | DELAYED_RELEASE_CAPSULE | Freq: Every day | ORAL | 0 refills | Status: DC

## 2018-01-26 MED ORDER — GI COCKTAIL ~~LOC~~
30.0000 mL | Freq: Once | ORAL | Status: AC
  Administered 2018-01-26: 30 mL via ORAL
  Filled 2018-01-26: qty 30

## 2018-01-26 NOTE — Discharge Instructions (Addendum)
Your pain is likely due to irritation of your stomach and esophagus.  Stop taking ketorolac.  Also avoid other anti-inflammatory medications such as aspirin, naproxen or ibuprofen.  Avoid alcohol, caffeine and spicy foods.  Take stomach medications as prescribed.  Follow-up with Dr. Oneida Alar for consideration of endoscopy.  Return to the ED if you develop new or worsening symptoms.
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
## 2018-01-26 NOTE — ED Triage Notes (Signed)
Pt c/o left upper abd pain that started after taking a dose of Toradol for cyst on right ovary, pt states that the last few times she has taken this medication she has had the same symptoms, pain is associated with nausea,

## 2018-02-01 ENCOUNTER — Other Ambulatory Visit: Payer: Self-pay

## 2018-02-01 ENCOUNTER — Encounter: Payer: Self-pay | Admitting: Internal Medicine

## 2018-02-01 ENCOUNTER — Ambulatory Visit (INDEPENDENT_AMBULATORY_CARE_PROVIDER_SITE_OTHER): Payer: Self-pay | Admitting: Internal Medicine

## 2018-02-01 DIAGNOSIS — R1013 Epigastric pain: Secondary | ICD-10-CM | POA: Insufficient documentation

## 2018-02-01 DIAGNOSIS — I10 Essential (primary) hypertension: Secondary | ICD-10-CM

## 2018-02-01 HISTORY — DX: Epigastric pain: R10.13

## 2018-02-01 MED ORDER — OMEPRAZOLE 40 MG PO CPDR: 40.0000 mg | DELAYED_RELEASE_CAPSULE | Freq: Every day | ORAL | 0 refills | Status: DC

## 2018-02-01 MED ORDER — SUCRALFATE 1 G PO TABS: 1.0000 g | ORAL_TABLET | Freq: Three times a day (TID) | ORAL | 0 refills | Status: DC

## 2018-02-01 MED ORDER — IRBESARTAN 150 MG PO TABS: 150.0000 mg | ORAL_TABLET | Freq: Every day | ORAL | 0 refills | Status: DC

## 2018-02-01 NOTE — Patient Instructions (Addendum)
It was so nice to see you!  I have increased your Prilosec from 20mg  daily to 40mg  daily. I have sent in a new prescription for the Carafate.  I have changed your Losartan to Irbesartan. You should still take this once a day.  -Dr. Brett Albino

## 2018-02-01 NOTE — Assessment & Plan Note (Signed)
Likely due to gastritis vs PUD in the setting of Ketorolac use. No signs of acute abdomen. No fevers, chills, or unintentional weight loss to suggest malignancy. Unable to follow-up with GI because she does not have insurance yet. - Increased Protonix from 20mg  daily to 40mg  daily - Continue Carafate qid - Will refer to GI once patient gets insurance

## 2018-02-01 NOTE — Progress Notes (Signed)
   [facility] Phone: [phone]  Subjective:  [name] is a 52 year old female presenting to clinic for follow-up her epigastric pain and HTN.  Epigastric Pain: Started 2 weeks ago after she took Ketorolac for ovarian cyst pain. She was seen in the ED on 01/26/18 for this. She was thought to have gastritis vs ulcer. She was told to stop the Ketorolac and was prescribed Protonix and Carafate. These have been helping a little bit. She was told to follow-up with GI, but she is unable to do this because she does not have insurance. Her pain feels like a "gnawing". The pain is worse when she wakes up in the morning and worse with eating most foods. The pain is better with eating watermelon and ice cream. She has associated nausea, but no vomiting. She also has a decreased appetite because of the pain. No fevers, no chills, no unintentional weight loss.   HTN: Does not check her blood pressures at home. Taking Metoprolol succinate 50mg  daily, HCTZ 25mg  daily, and Losartan 100mg  daily. Her pharmacy has recalled Losartan and she needs to get it switched. No chest pain, no shortness of breath, no lower extremity edema.  ROS: See HPI for pertinent positives and negatives  Past Medical History- CAD, HTN, HLD, obesity, seasonal allergies  Family history reviewed for today's visit. No changes.  Social history- patient is a never smoker  Objective: BP 124/84   Pulse 70   Temp 97.6 F (36.4 C) (Oral)   Ht 5\' 8"  (1.727 m)   Wt 227 lb (103 kg)   LMP 10/06/2013   SpO2 96%   BMI 34.52 kg/m  Gen: NAD, alert, cooperative with exam HEENT: NCAT, EOMI, MMM Neck: FROM, supple CV: RRR, no murmur Resp: CTABL, no wheezes, normal work of breathing GI: +BS, soft, +tenderness to palpation in the epigastric area. Msk: No edema Neuro: Alert and oriented, no gross deficits Skin: No rashes, no lesions Psych: Appropriate behavior  Assessment/Plan: Epigastric Pain: Likely due to  gastritis vs PUD in the setting of Ketorolac use. No signs of acute abdomen. No fevers, chills, or unintentional weight loss to suggest malignancy. Unable to follow-up with GI because she does not have insurance yet. - Increased Protonix from 20mg  daily to 40mg  daily - Continue Carafate qid - Will refer to GI once patient gets insurance  HTN: Well-controlled. BP 124/84 in clinic today. - Change Losartan to Irbesartan 150mg  daily due to recall - Continue HCTZ 25mg  daily and Metoprolol succinate 50mg  daily   Hyman Bible, MD PGY-3

## 2018-02-01 NOTE — Assessment & Plan Note (Signed)
Well-controlled. BP 124/84 in clinic today. - Change Losartan to Irbesartan 150mg  daily due to recall - Continue HCTZ 25mg  daily and Metoprolol succinate 50mg  daily

## 2018-02-05 ENCOUNTER — Telehealth: Payer: Self-pay | Admitting: Internal Medicine

## 2018-02-05 NOTE — Telephone Encounter (Signed)
Clinical info completed on medical form.  Place form in Dr. Velia Meyer box for completion.  Andreas Newport, [place]

## 2018-02-05 NOTE — Telephone Encounter (Signed)
Accommodation Request form dropped off for at front desk for completion.  Verified that patient section of form has been completed.  Last DOS with PCP was 02/01/18.  Placed form in  [facility] team folder to be completed by clinical staff.  Carmina Miller

## 2018-02-08 NOTE — Telephone Encounter (Signed)
Form completed and placed in RN box

## 2018-02-08 NOTE — Telephone Encounter (Signed)
Forms faxed per pt. Request. Copies in scan box. Wallace Cullens, RN

## 2018-02-18 ENCOUNTER — Other Ambulatory Visit: Payer: Self-pay | Admitting: Cardiovascular Disease

## 2018-02-18 DIAGNOSIS — R0789 Other chest pain: Secondary | ICD-10-CM

## 2018-02-18 DIAGNOSIS — R002 Palpitations: Secondary | ICD-10-CM

## 2018-03-01 ENCOUNTER — Encounter: Payer: Self-pay | Admitting: Family Medicine

## 2018-03-01 NOTE — Progress Notes (Signed)
Preceptor today, called to see patient who is here for nurse visit:  - Patient walked in due to pain in Left popliteal fossa.  Giving plasma last Thursday.  Noted increased bruising and pain in area over the weekend.  Due to bruising, she presented to clinic today.  No injury to area.  No bleeding.  TTP in popliteal fossa itself.  Has noted worsening of dependent areas of bruising.  Using heat without relief.   Exam:   Gen:  Alert, cooperative patient who appears stated age in no acute distress.  Vital signs reviewed. Skin:  Bruising noted just distal to Left popliteal fossa.  About 9 cm in diameter.   Neurovascular:  Radial and ulnar pulses +2 Left hand.  No coldness.  Sensation fully intact.   Appears to be burst blood vessel from donating plasma.  Drew line with marker around extent of bruising.  Will expect bruising to spread due to gravity, but shouldn't have spread against gravity where marker lines are drawn.  To FU with [place] in 2 days to re-assess and ensure resolving.  No red flags.  She takes ASA but no other blood thinners.

## 2018-03-03 ENCOUNTER — Ambulatory Visit: Payer: Self-pay | Admitting: Internal Medicine

## 2018-03-03 ENCOUNTER — Ambulatory Visit: Payer: Self-pay | Admitting: Family Medicine
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
## 2018-03-05 ENCOUNTER — Other Ambulatory Visit: Payer: Self-pay

## 2018-03-05 ENCOUNTER — Encounter: Payer: Self-pay | Admitting: Family Medicine

## 2018-03-05 ENCOUNTER — Ambulatory Visit (INDEPENDENT_AMBULATORY_CARE_PROVIDER_SITE_OTHER): Payer: Self-pay | Admitting: Family Medicine

## 2018-03-05 VITALS — BP 104/70 | HR 75 | Temp 98.0°F | Ht 68.0 in | Wt 227.8 lb

## 2018-03-05 DIAGNOSIS — T148XXA Other injury of unspecified body region, initial encounter: Secondary | ICD-10-CM
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
## 2018-03-18 ENCOUNTER — Other Ambulatory Visit: Payer: Self-pay | Admitting: Internal Medicine

## 2018-03-18 ENCOUNTER — Other Ambulatory Visit: Payer: Self-pay

## 2018-03-18 DIAGNOSIS — I10 Essential (primary) hypertension: Secondary | ICD-10-CM

## 2018-03-18 MED ORDER — LOSARTAN POTASSIUM 50 MG PO TABS: 50.0000 mg | ORAL_TABLET | Freq: Every day | ORAL | 0 refills | Status: DC

## 2018-03-18 MED ORDER — HYDROCHLOROTHIAZIDE 25 MG PO TABS: ORAL_TABLET | ORAL | 0 refills | Status: DC

## 2018-03-18 NOTE — Telephone Encounter (Signed)
New prescriptions sent. Thanks!

## 2018-03-18 NOTE — Telephone Encounter (Signed)
Patient called for refills of HCTZ and Losartan. She is requesting Losartan she was never able to get the Irbesartan.  Call back is [phone]  Danley Danker, RN [facility] [place])

## 2018-04-01 ENCOUNTER — Encounter: Payer: Self-pay | Admitting: Family Medicine

## 2018-04-07 ENCOUNTER — Ambulatory Visit: Payer: Self-pay | Admitting: Family Medicine

## 2018-04-16 ENCOUNTER — Ambulatory Visit: Payer: Self-pay

## 2018-04-19 ENCOUNTER — Telehealth: Payer: Self-pay | Admitting: *Deleted

## 2018-04-19 ENCOUNTER — Telehealth: Payer: Self-pay

## 2018-04-19 NOTE — Telephone Encounter (Signed)
Patient left message that when Dr Brett Albino restarted her Losartan on 03/18/18 that she sent in Losartan 50 mg instead of Losartan 100 mg. Patient has always been on 100 mg and wants this sent in at the correct strength.  Call back is [phone]  Danley Danker, RN [facility] [place])

## 2018-04-19 NOTE — Telephone Encounter (Signed)
Pt states that she is on losartan 100mg  and is not sure why it was switched to 50mg .  She has been taking 2 of the pills a day to equal the 100mg  but is running out in 2 weeks.  Will forward to MD to advise. Niguel Moure, Salome Spotted, CMA

## 2018-04-22 ENCOUNTER — Other Ambulatory Visit: Payer: Self-pay | Admitting: Family Medicine

## 2018-04-22 DIAGNOSIS — I10 Essential (primary) hypertension: Secondary | ICD-10-CM

## 2018-04-22 MED ORDER — LOSARTAN POTASSIUM 100 MG PO TABS: 100.0000 mg | ORAL_TABLET | Freq: Every day | ORAL | 3 refills | Status: DC

## 2018-04-22 NOTE — Telephone Encounter (Signed)
I refilled the losartan 100 mg. Sent to pt's pharmacy. Please let pt know that Rx is ready to be picked up.

## 2018-05-02 IMAGING — DX DG CHEST 2V
2 series · 2 of 2 positions shown · non-contrast
Comparison: 12/29/2016

CLINICAL DATA: Chest pain and shortness of breath.

EXAM:
CHEST  2 VIEW

[w chest pa]
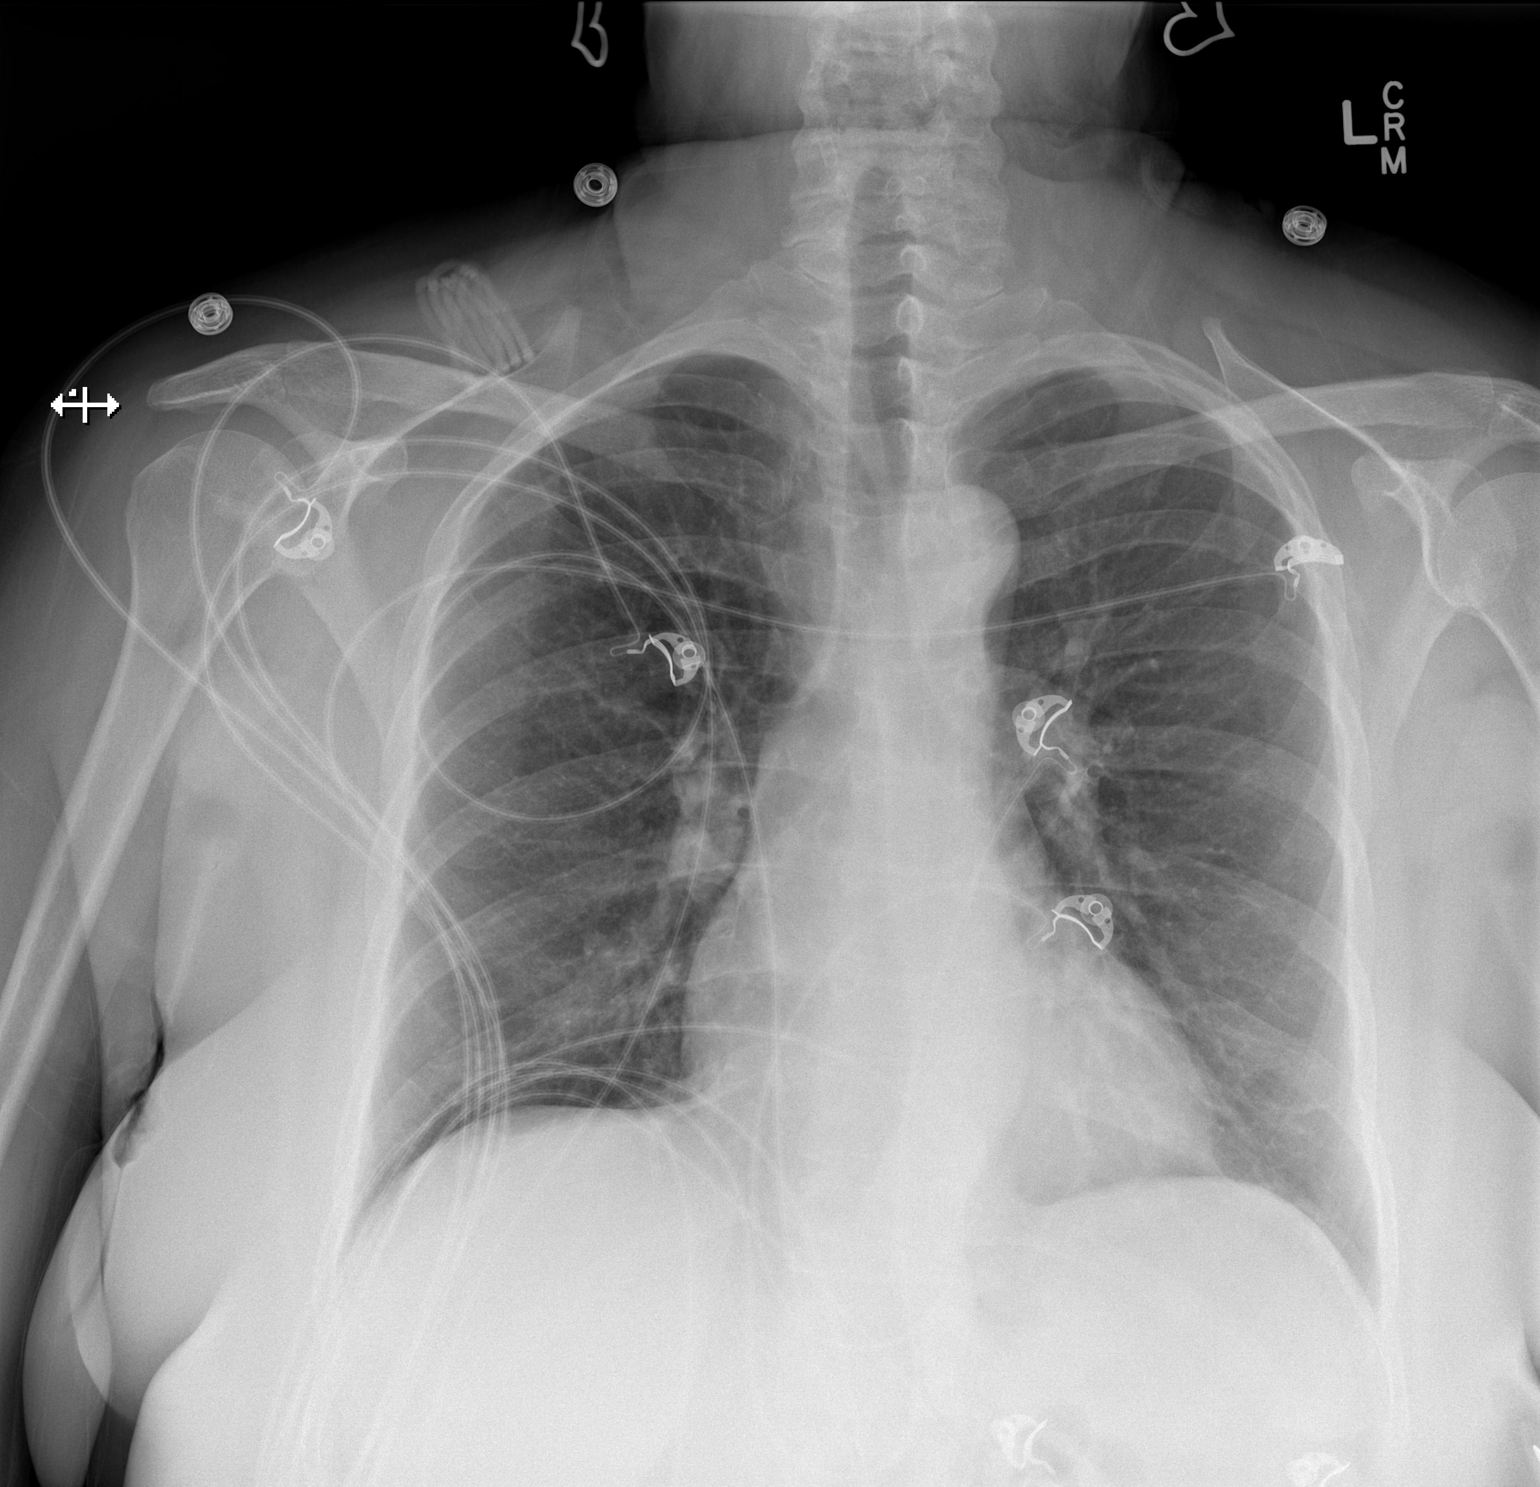

[w chest lat]
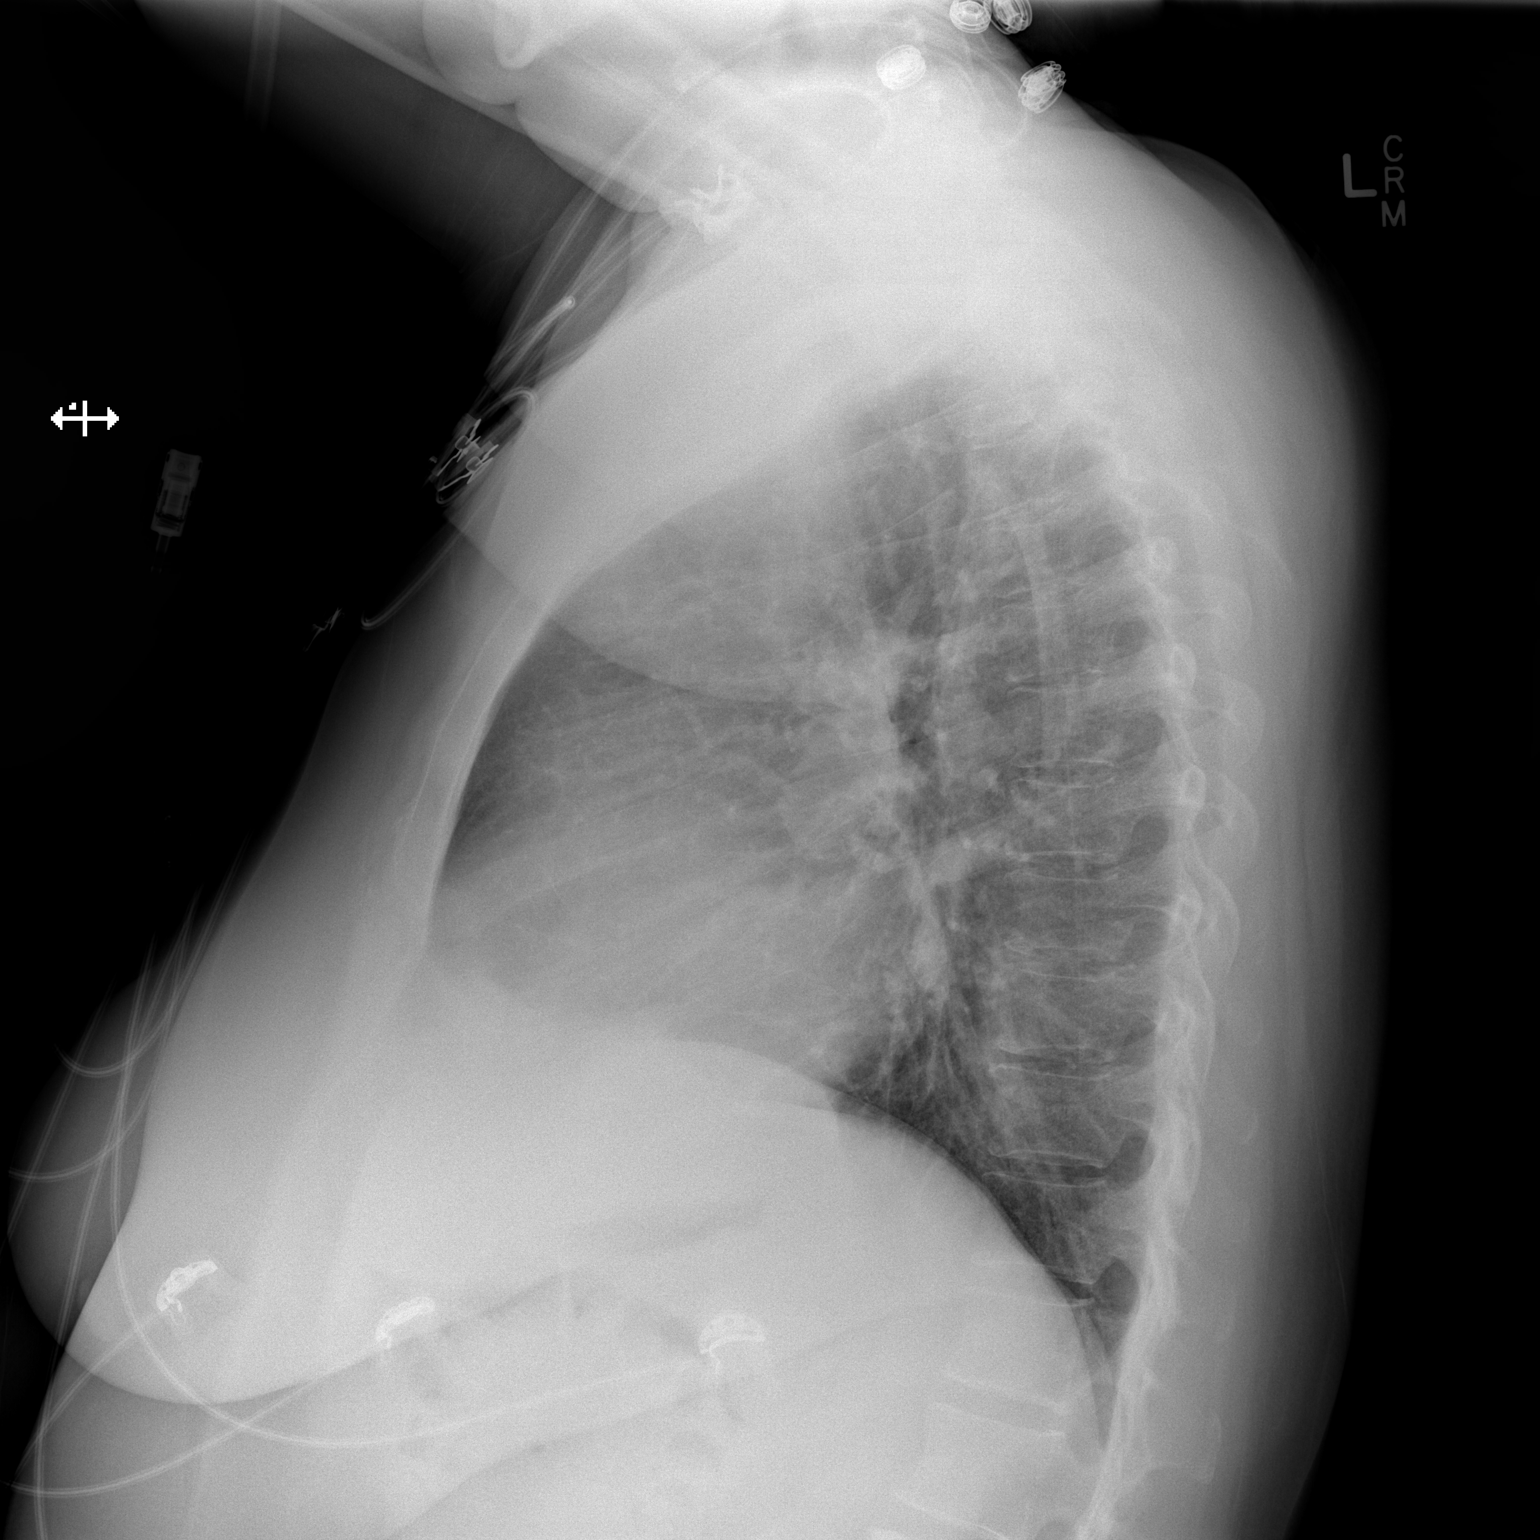

[2 of 2 positions shown; findings below may reference images not displayed]

FINDINGS: Cardiomediastinal silhouette is normal. Mediastinal contours appear
intact.

There is no evidence of focal airspace consolidation, pleural
effusion or pneumothorax.

Osseous structures are without acute abnormality. Soft tissues are
grossly normal.
IMPRESSION: No active cardiopulmonary disease.

## 2018-05-04 ENCOUNTER — Encounter (HOSPITAL_COMMUNITY): Payer: Self-pay | Admitting: Emergency Medicine

## 2018-05-04 ENCOUNTER — Emergency Department (HOSPITAL_COMMUNITY)
Admission: EM | Admit: 2018-05-04 | Discharge: 2018-05-04 | Disposition: A | Discharge status: Home / Self Care | Payer: Self-pay | Attending: Emergency Medicine | Admitting: Emergency Medicine

## 2018-05-04 ENCOUNTER — Telehealth: Payer: Self-pay | Admitting: Family Medicine

## 2018-05-04 ENCOUNTER — Emergency Department (HOSPITAL_COMMUNITY): Payer: Self-pay

## 2018-05-04 ENCOUNTER — Other Ambulatory Visit: Payer: Self-pay

## 2018-05-04 DIAGNOSIS — Z79899 Other long term (current) drug therapy: Secondary | ICD-10-CM | POA: Insufficient documentation

## 2018-05-04 DIAGNOSIS — I10 Essential (primary) hypertension: Secondary | ICD-10-CM | POA: Insufficient documentation

## 2018-05-04 DIAGNOSIS — Z7982 Long term (current) use of aspirin: Secondary | ICD-10-CM | POA: Insufficient documentation

## 2018-05-04 DIAGNOSIS — S92501A Displaced unspecified fracture of right lesser toe(s), initial encounter for closed fracture: Secondary | ICD-10-CM | POA: Insufficient documentation

## 2018-05-04 DIAGNOSIS — Y999 Unspecified external cause status: Secondary | ICD-10-CM | POA: Insufficient documentation

## 2018-05-04 DIAGNOSIS — Y929 Unspecified place or not applicable: Secondary | ICD-10-CM | POA: Insufficient documentation

## 2018-05-04 DIAGNOSIS — Y9301 Activity, walking, marching and hiking: Secondary | ICD-10-CM | POA: Insufficient documentation

## 2018-05-04 DIAGNOSIS — W2209XA Striking against other stationary object, initial encounter: Secondary | ICD-10-CM | POA: Insufficient documentation

## 2018-05-04 DIAGNOSIS — I2510 Atherosclerotic heart disease of native coronary artery without angina pectoris: Secondary | ICD-10-CM | POA: Insufficient documentation

## 2018-05-04 DIAGNOSIS — Z91040 Latex allergy status: Secondary | ICD-10-CM | POA: Insufficient documentation

## 2018-05-04 MED ORDER — TRAMADOL HCL 50 MG PO TABS
ORAL_TABLET | ORAL | Status: AC
  Administered 2018-05-04: 50 mg via ORAL
  Filled 2018-05-04: qty 1

## 2018-05-04 MED ORDER — TRAMADOL HCL 50 MG PO TABS: 50.0000 mg | ORAL_TABLET | Freq: Four times a day (QID) | ORAL | 0 refills | Status: DC | PRN

## 2018-05-04 MED ORDER — TRAMADOL HCL 50 MG PO TABS
50.0000 mg | ORAL_TABLET | Freq: Once | ORAL | Status: AC
  Administered 2018-05-04: 50 mg via ORAL
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
## 2018-05-04 NOTE — Telephone Encounter (Signed)
Pt broke her toe last night, it was confirmed by ED. She is wanting a hard shoe to wear.  She cannot get any of her shoes on. The dr last night didn't give her one. Please advise

## 2018-05-04 NOTE — Discharge Instructions (Addendum)
Buddy tape of toes.  Keep your foot elevated is much as possible and ice for 20 minutes of every 2 hours while awake for the next 2 days.  Ibuprofen 600 mg every 6 hours as needed for pain.  Tramadol as needed for pain not relieved with ibuprofen.

## 2018-05-04 NOTE — Telephone Encounter (Signed)
Patient informed that she doesn't need a script for this shoe and can pick one up at any medical supply store.  Patient voiced understanding.  Jazmin Hartsell,CMA

## 2018-05-04 NOTE — ED Triage Notes (Signed)
Pt hit 5th toe on right foot on the corner of a cabinet when she was going to the bathroom.

## 2018-05-20 ENCOUNTER — Other Ambulatory Visit: Payer: Self-pay | Admitting: Cardiovascular Disease

## 2018-05-20 DIAGNOSIS — R002 Palpitations: Secondary | ICD-10-CM

## 2018-05-20 DIAGNOSIS — R0789 Other chest pain: Secondary | ICD-10-CM

## 2018-05-20 NOTE — Telephone Encounter (Signed)
°*  STAT* If patient is at the pharmacy, call can be transferred to refill team.   1. Which medications need to be refilled? (please list name of each medication and dose if known)  Metoprolol  2. Which pharmacy/location (including street and city if local pharmacy) is medication to be sent to? [facility] [phone]  3. Do they need a 30 day or 90 day supply? Need enough until her appt in October  :

## 2018-05-25 ENCOUNTER — Encounter: Payer: Self-pay | Admitting: Family Medicine

## 2018-05-25 MED ORDER — METOPROLOL SUCCINATE ER 25 MG PO TB24: 25.0000 mg | ORAL_TABLET | Freq: Every day | ORAL | 3 refills | Status: DC

## 2018-05-25 NOTE — Telephone Encounter (Signed)
Spoke with pt, metoprolol 25 mg sent to the pharmacy.

## 2018-05-25 NOTE — Telephone Encounter (Signed)
Left message for pt to call, ? What dose of metoprolol is she taking, 50 or 25 mg?

## 2018-06-06 IMAGING — CT CT RENAL STONE PROTOCOL
2 of 4 series · 16 of 46 positions shown, 18 images · non-contrast
Comparison: 07/31/2016

CLINICAL DATA: Right-sided flank pain beginning 7 hours ago.

EXAM:
CT ABDOMEN AND PELVIS WITHOUT CONTRAST
TECHNIQUE: Multidetector CT imaging of the abdomen and pelvis was performed
following the standard protocol without IV contrast.

[Series 2: axial st · axial · 0.85mm/px · z∈[-135,+285]mm · 13 of 94 slices shown, 15 images]
[im 5/94  soft-tissue]
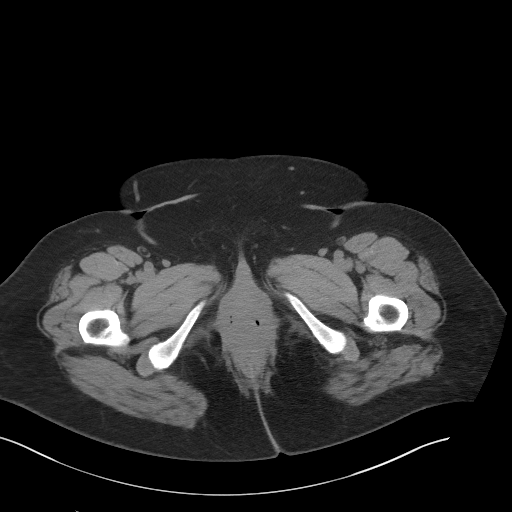
[im 5/94  bone]
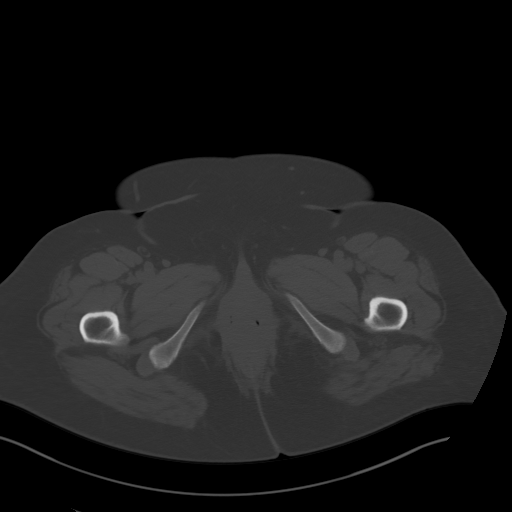
[im 13/94  soft-tissue]
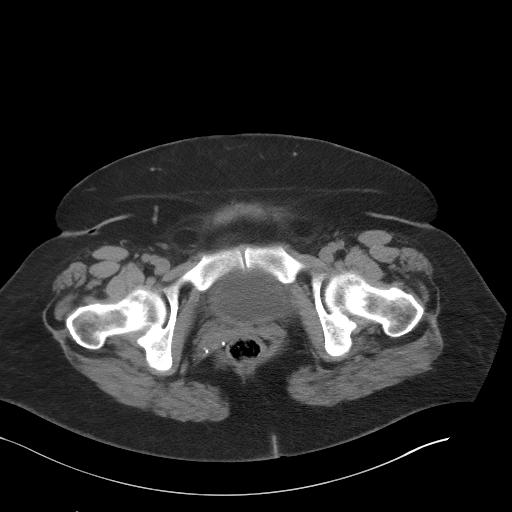
[im 21/94  soft-tissue]
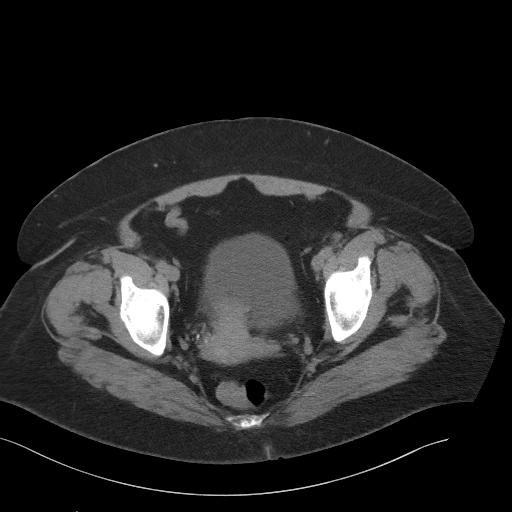
[im 25/94  soft-tissue]
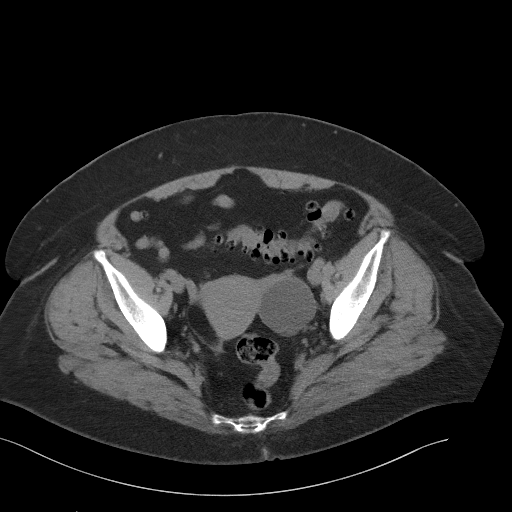
[im 33/94  soft-tissue]
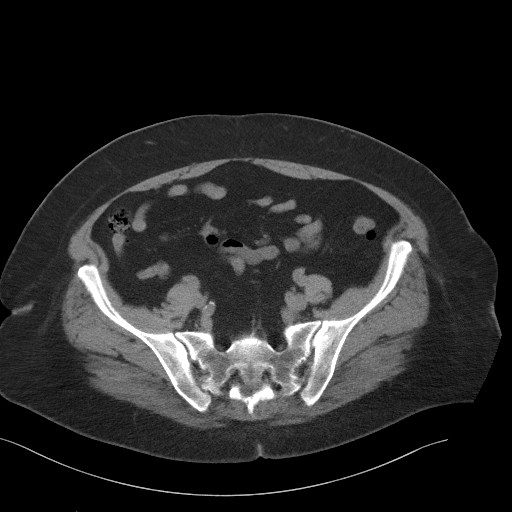
[im 41/94  soft-tissue]
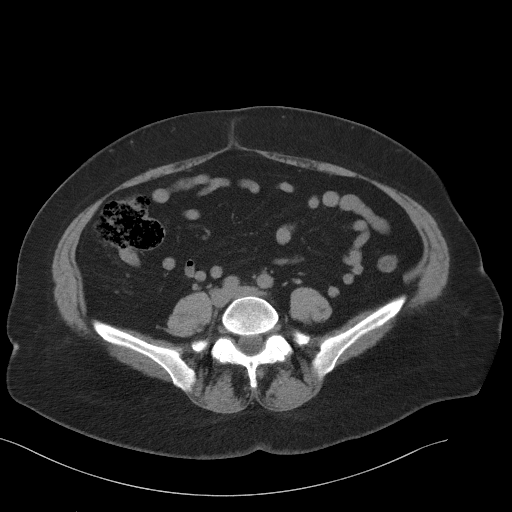
[im 49/94  soft-tissue]
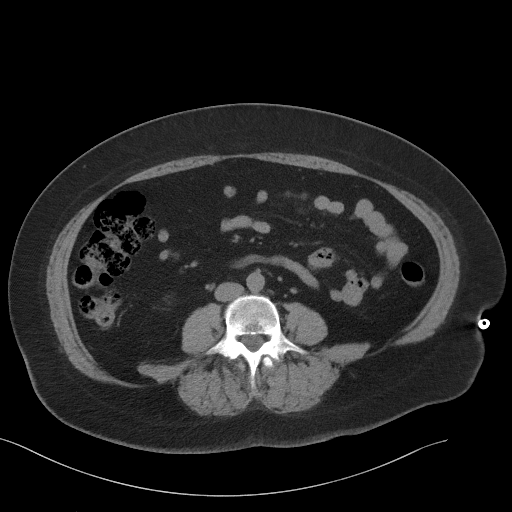
[im 53/94  soft-tissue]
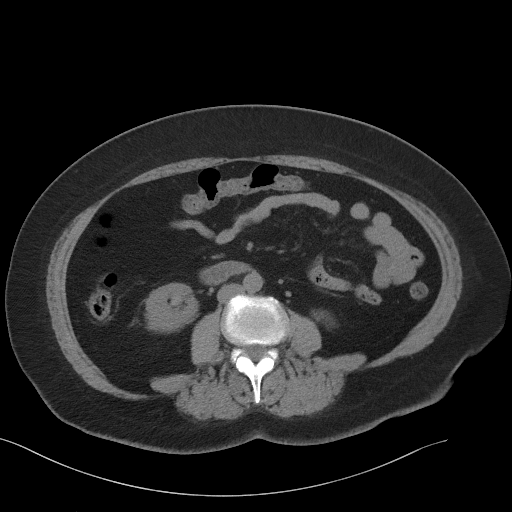
[im 61/94  soft-tissue]
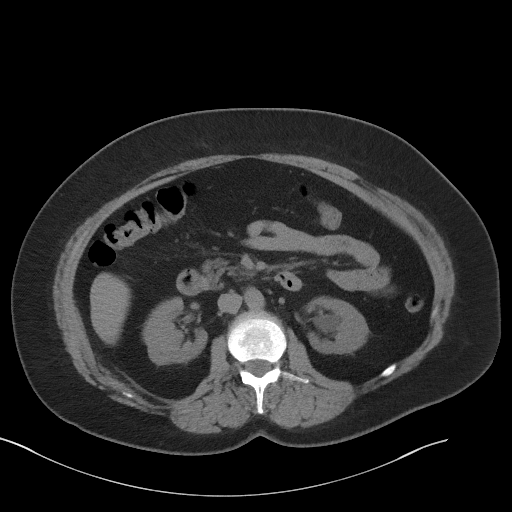
[im 61/94  bone]
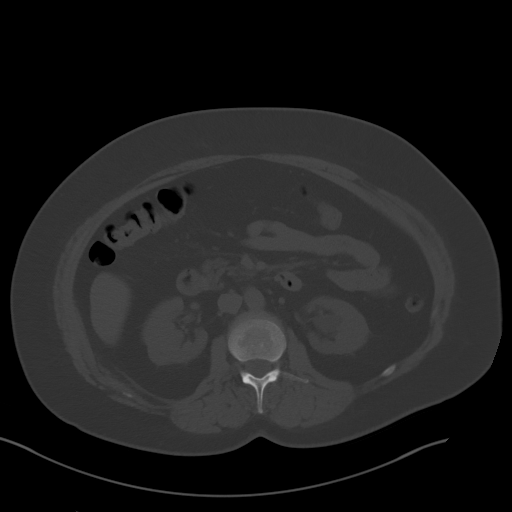
[im 69/94  soft-tissue]
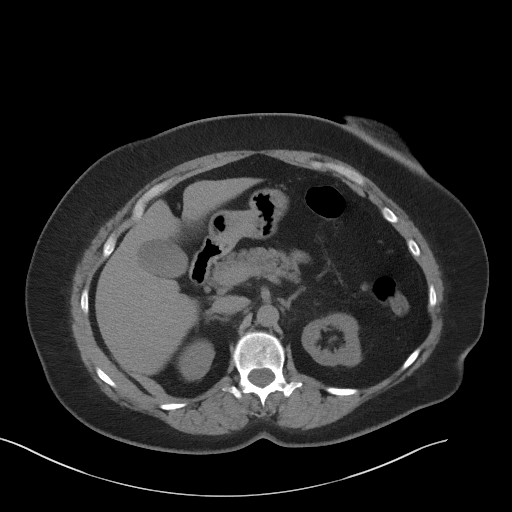
[im 73/94  soft-tissue]
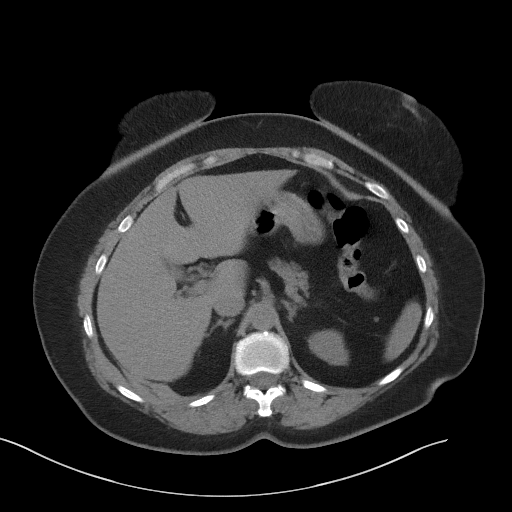
[im 81/94  soft-tissue]
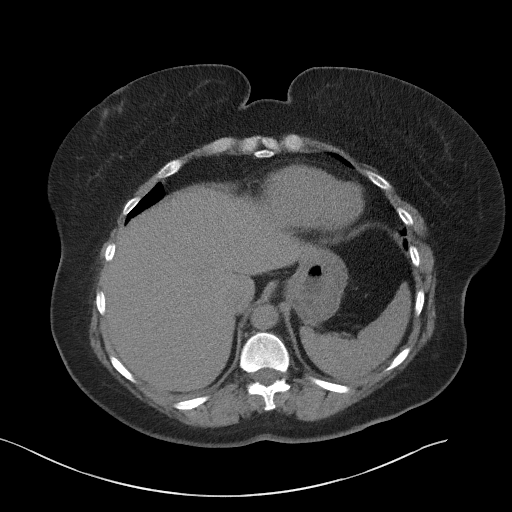
[im 89/94  soft-tissue]
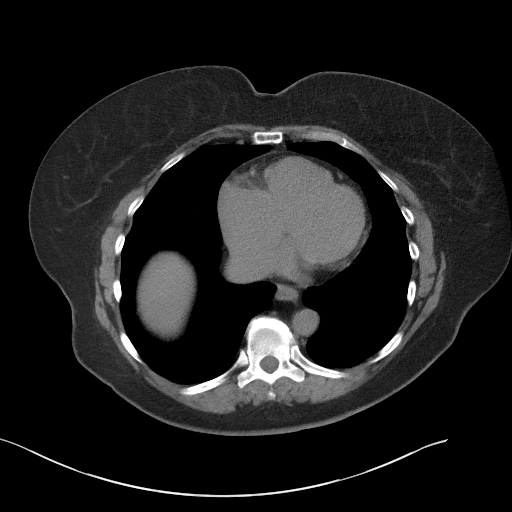

[Series 5: coronal st · coronal · 0.79mm/px · 3 of 101 slices shown]
[im 34/101  soft-tissue]
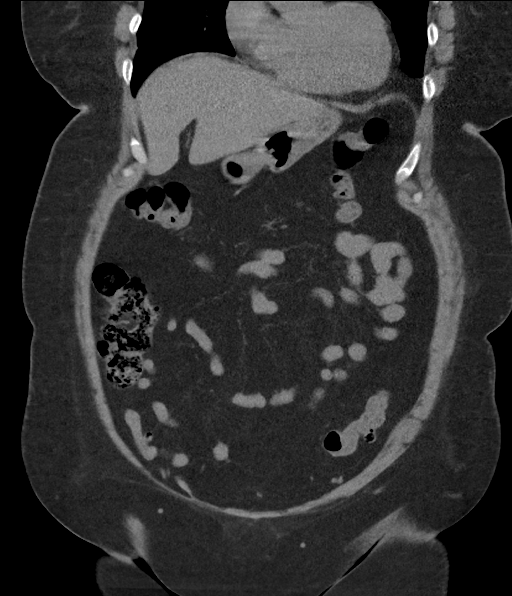
[im 45/101  soft-tissue]
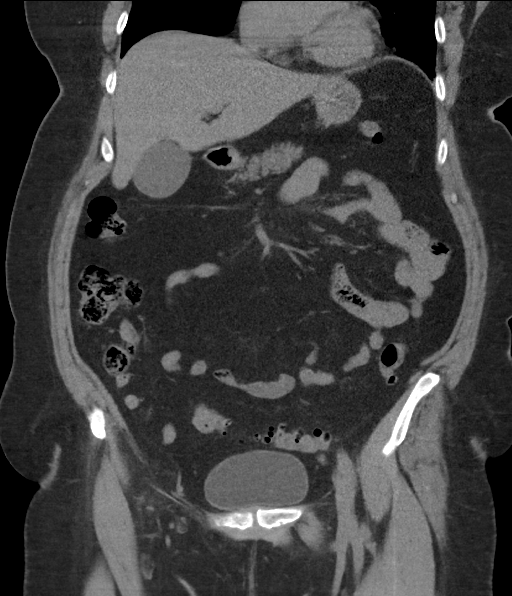
[im 56/101  soft-tissue]
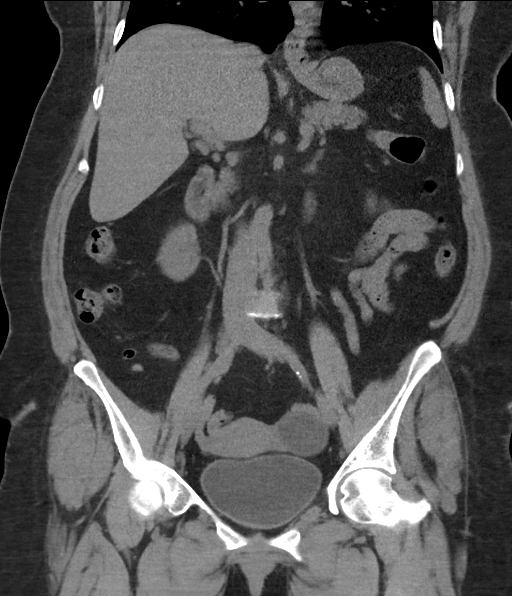

[16 of 46 positions shown; findings below may reference images not displayed]

FINDINGS: Lower chest: No active disease.

Hepatobiliary: Normal without contrast.

Pancreas: Normal

Spleen: Normal

Adrenals/Urinary Tract: Adrenal glands are normal. Kidneys are
normal. No cyst, mass, stone or hydronephrosis. Few small parapelvic
cysts not of any significance. Bladder is normal.

Stomach/Bowel: No acute or significant bowel finding. Normal
appearing appendix. Diverticulosis without imaging evidence of
diverticulitis.

Vascular/Lymphatic: Minimal aortic atherosclerosis. No aneurysm. IVC
is normal. No retroperitoneal adenopathy.

Reproductive: Uterus appears normal. Simple appearing left ovarian
cyst measuring 5 cm in diameter. This has showed a slow enlargement
since 1261 when it measured 2.7 cm. Ultrasound evaluation 7809
showed a simple appearing cyst.

Other: No free fluid or air.

Musculoskeletal: Ordinary mild lumbar degenerative changes.
IMPRESSION: No acute finding. No evidence of urinary tract stone disease. No
abnormality seen to explain right flank pain. Normal appearing
appendix.

Diverticulosis without evidence of diverticulitis.

5 cm cyst of the left ovary, simple appearing by CT. This has
enlarged progressively since 1261. Though likely benign, ultrasound
would be suggested to correlate with the previous ultrasound study

## 2018-07-14 ENCOUNTER — Ambulatory Visit: Payer: Self-pay | Admitting: Cardiovascular Disease

## 2018-07-15 ENCOUNTER — Encounter: Payer: Self-pay | Admitting: Cardiovascular Disease

## 2018-07-25 ENCOUNTER — Encounter: Payer: Self-pay | Admitting: Family Medicine

## 2018-07-25 DIAGNOSIS — F411 Generalized anxiety disorder: Secondary | ICD-10-CM

## 2018-07-26 ENCOUNTER — Other Ambulatory Visit: Payer: Self-pay

## 2018-07-26 DIAGNOSIS — F411 Generalized anxiety disorder: Secondary | ICD-10-CM

## 2018-07-26 MED ORDER — BUSPIRONE HCL 7.5 MG PO TABS: 7.5000 mg | ORAL_TABLET | Freq: Two times a day (BID) | ORAL | 0 refills | Status: DC

## 2018-08-10 ENCOUNTER — Ambulatory Visit (INDEPENDENT_AMBULATORY_CARE_PROVIDER_SITE_OTHER): Payer: Self-pay | Admitting: Licensed Clinical Social Worker

## 2018-08-10 ENCOUNTER — Encounter: Payer: Self-pay | Admitting: Family Medicine

## 2018-08-10 ENCOUNTER — Ambulatory Visit (INDEPENDENT_AMBULATORY_CARE_PROVIDER_SITE_OTHER): Payer: Self-pay | Admitting: Family Medicine

## 2018-08-10 ENCOUNTER — Other Ambulatory Visit: Payer: Self-pay

## 2018-08-10 VITALS — BP 122/78 | HR 81 | Temp 98.0°F | Ht 68.0 in | Wt 230.8 lb

## 2018-08-10 DIAGNOSIS — F419 Anxiety disorder, unspecified: Secondary | ICD-10-CM

## 2018-08-10 DIAGNOSIS — R002 Palpitations: Secondary | ICD-10-CM

## 2018-08-10 DIAGNOSIS — F411 Generalized anxiety disorder: Secondary | ICD-10-CM

## 2018-08-10 DIAGNOSIS — R0789 Other chest pain: Secondary | ICD-10-CM

## 2018-08-10 DIAGNOSIS — N3000 Acute cystitis without hematuria: Secondary | ICD-10-CM | POA: Insufficient documentation

## 2018-08-10 DIAGNOSIS — R300 Dysuria: Secondary | ICD-10-CM

## 2018-08-10 DIAGNOSIS — N3001 Acute cystitis with hematuria: Secondary | ICD-10-CM

## 2018-08-10 LAB — POCT URINALYSIS DIP (MANUAL ENTRY)
BILIRUBIN UA: NEGATIVE mg/dL
Bilirubin, UA: NEGATIVE
Glucose, UA: NEGATIVE mg/dL
Nitrite, UA: NEGATIVE
PROTEIN UA: NEGATIVE mg/dL
UROBILINOGEN UA: 0.2 U/dL
pH, UA: 5.5 (ref 5.0–8.0)

## 2018-08-10 LAB — POCT UA - MICROSCOPIC ONLY: Epithelial cells, urine per micros: >20

## 2018-08-10 MED ORDER — NITROFURANTOIN MONOHYD MACRO 100 MG PO CAPS: 100.0000 mg | ORAL_CAPSULE | Freq: Two times a day (BID) | ORAL | 0 refills | Status: AC

## 2018-08-10 MED ORDER — BUSPIRONE HCL 7.5 MG PO TABS: 7.5000 mg | ORAL_TABLET | Freq: Two times a day (BID) | ORAL | 1 refills | Status: DC

## 2018-08-10 NOTE — Assessment & Plan Note (Signed)
History and physical exam are consistent with an uncomplicated cystitis.  No nitrates noted and a UA positive leukocytes.  Vitals are entirely within normal limits and have low suspicion for pyelonephritis.  UA was also remarkable for hemoglobin and I would like to recheck a urine after the resolution of her UTI symptoms. -Nitrofurantoin 100 twice daily for 5 days -Repeat urinalysis after resolution of UTI symptoms

## 2018-08-10 NOTE — Patient Instructions (Signed)
Thanks for coming in today!  I'm glad we got a chance to meet. Here is a quick summary of what we talked about:  1) Anxiety: I will refill your Buspar and you are welcome to take it more regularly that you are (twice daily).  But if you feel good relief the way you are taking it now, that is ok.  I would also like you take advantage of the therapy resources here.  2)I have prescribed you 5 days of antibiotics for your UTI.  Please come back and provide an additional urine sample once your symptoms have totally resolved.
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
## 2018-08-10 NOTE — Addendum Note (Signed)
Addended by: Casimer Lanius H on: 08/10/2018 03:46 PM   Modules accepted: Level of Service
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
## 2018-08-10 NOTE — Progress Notes (Signed)
u

## 2018-08-17 ENCOUNTER — Other Ambulatory Visit: Payer: Self-pay

## 2018-08-23 ENCOUNTER — Other Ambulatory Visit (INDEPENDENT_AMBULATORY_CARE_PROVIDER_SITE_OTHER): Payer: Self-pay

## 2018-08-23 ENCOUNTER — Encounter: Payer: Self-pay | Admitting: Family Medicine

## 2018-08-23 DIAGNOSIS — R300 Dysuria: Secondary | ICD-10-CM

## 2018-08-23 LAB — POCT URINALYSIS DIP (MANUAL ENTRY)
Bilirubin, UA: NEGATIVE
GLUCOSE UA: NEGATIVE mg/dL
Ketones, POC UA: NEGATIVE mg/dL
Nitrite, UA: NEGATIVE
Protein Ur, POC: NEGATIVE mg/dL
RBC UA: NEGATIVE
Spec Grav, UA: 1.02 (ref 1.010–1.025)
Urobilinogen, UA: 0.2 E.U./dL
pH, UA: 7 (ref 5.0–8.0)

## 2018-08-23 LAB — POCT UA - MICROSCOPIC ONLY

## 2018-08-23 NOTE — Progress Notes (Unsigned)
urine

## 2018-08-24 ENCOUNTER — Other Ambulatory Visit: Payer: Self-pay | Admitting: Family Medicine

## 2018-08-24 DIAGNOSIS — R7303 Prediabetes: Secondary | ICD-10-CM

## 2018-08-25 ENCOUNTER — Telehealth: Payer: Self-pay | Admitting: *Deleted

## 2018-08-25 NOTE — Telephone Encounter (Signed)
LM for patient to call back and please schedule an appointment to follow up on her urine.  She can see anyone on blue team if PCP is full.  Teodoro Jeffreys,CMA

## 2018-08-25 NOTE — Telephone Encounter (Signed)
-----   Message from Matilde Haymaker, MD sent at 08/24/2018 12:12 AM EST ----- Regarding: Follow up UA and culture The repeat UA for this pt was clear of blood which is what we were looking for.  However, it looks like she still has a lot of WBCs in her urine.  Please call and ask if she is having any polyuria, dysuria, suprapubic pain or back pain.  Please also have her come to the clinic for a repeat UA w/ culture and A1c check.  Her prediabetes is likely contributing to this increased incidence of UTIs.  Thank you!  -Collier Salina

## 2018-09-02 ENCOUNTER — Encounter: Payer: Self-pay | Admitting: Family Medicine

## 2018-09-02 ENCOUNTER — Ambulatory Visit (INDEPENDENT_AMBULATORY_CARE_PROVIDER_SITE_OTHER): Payer: Self-pay | Admitting: Family Medicine

## 2018-09-02 ENCOUNTER — Other Ambulatory Visit (HOSPITAL_COMMUNITY)
Admission: RE | Admit: 2018-09-02 | Discharge: 2018-09-02 | Disposition: A | Discharge status: Home / Self Care | Payer: Self-pay | Source: Ambulatory Visit | Attending: Family Medicine | Admitting: Family Medicine

## 2018-09-02 ENCOUNTER — Other Ambulatory Visit: Payer: Self-pay

## 2018-09-02 VITALS — BP 110/70 | HR 70 | Temp 97.9°F | Ht 68.0 in | Wt 230.4 lb

## 2018-09-02 DIAGNOSIS — N898 Other specified noninflammatory disorders of vagina: Secondary | ICD-10-CM

## 2018-09-02 DIAGNOSIS — R300 Dysuria: Secondary | ICD-10-CM

## 2018-09-02 DIAGNOSIS — R7303 Prediabetes: Secondary | ICD-10-CM

## 2018-09-02 LAB — POCT URINALYSIS DIP (MANUAL ENTRY)
Bilirubin, UA: NEGATIVE
Blood, UA: NEGATIVE
GLUCOSE UA: NEGATIVE mg/dL
Ketones, POC UA: NEGATIVE mg/dL
Nitrite, UA: NEGATIVE
PROTEIN UA: NEGATIVE mg/dL
SPEC GRAV UA: 1.01 (ref 1.010–1.025)
Urobilinogen, UA: 0.2 E.U./dL
pH, UA: 6.5 (ref 5.0–8.0)

## 2018-09-02 LAB — POCT GLYCOSYLATED HEMOGLOBIN (HGB A1C): HbA1c, POC (controlled diabetic range): 6.1 % (ref 0.0–7.0)

## 2018-09-02 LAB — POCT UA - MICROSCOPIC ONLY

## 2018-09-05 NOTE — Progress Notes (Signed)
    Subjective:  [name] is a 52 y.o. female who presents to the [facility] today to follow up for previous UTI with persistent leukocytes in her urine.   [name] was roomed and was able to provide a urine sample in addition to a vaginal self swab due to her concern for STI.  I was delayed in clinic that day and was not able to see her before she left as she grew tired of waiting.  On 12/16, I called [name] to follow-up on the labs that were taken.  She reported that she continued to have symptoms of suprapubic tenderness, back pain and vaginal itching.  She denied dysuria, polyuria.  She also noted that she recently had intercourse and was concerned that she was having yellow/green vaginal discharge.  She also noted that she did experience fevers and significant fatigue about 1 week ago but none in the past several days.  Objective:  Physical Exam: BP 110/70   Pulse 70   Temp 97.9 F (36.6 C) (Oral)   Ht 5\' 8"  (1.727 m)   Wt 230 lb 6 oz (104.5 kg)   LMP 10/06/2013   SpO2 96%   BMI 35.03 kg/m   I have not assessed this patient in the clinic as she left before I was able to examine her.  Results for orders placed or performed in visit on 09/02/18 (from the past 72 hour(s))  HgB A1c     Status: None   Collection Time: 09/02/18  4:07 PM  Result Value Ref Range   Hemoglobin A1C     HbA1c POC (<> result, manual entry)     HbA1c, POC (prediabetic range)     HbA1c, POC (controlled diabetic range) 6.1 0.0 - 7.0 %  POCT urinalysis dipstick     Status: Abnormal   Collection Time: 09/02/18  4:20 PM  Result Value Ref Range   Color, UA yellow yellow   Clarity, UA clear clear   Glucose, UA negative negative mg/dL   Bilirubin, UA negative negative   Ketones, POC UA negative negative mg/dL   Spec Grav, UA 1.010 1.010 - 1.025   Blood, UA negative negative   pH, UA 6.5 5.0 - 8.0   Protein Ur, POC negative negative mg/dL   Urobilinogen, UA 0.2 0.2 or 1.0 E.U./dL   Nitrite, UA Negative Negative   Leukocytes, UA Small (1+) (A) Negative  POCT UA - Microscopic Only     Status: None   Collection Time: 09/02/18  4:20 PM  Result Value Ref Range   WBC, Ur, HPF, POC 0-3    RBC, urine, microscopic NONE    Bacteria, U Microscopic TRACE    Epithelial cells, urine per micros 1-5      Assessment/Plan:  No problem-specific Assessment & Plan notes found for this encounter.  Based on my phone conversation, I encouraged [name] to return to the clinic to be seen.  I informed her that based on the symptoms that she was mentioning, I was concerned about possible complicated UTI versus pelvic inflammatory disease/trichomonas. I informed her the best way to evaluate this issue would be to be seen in clinic again so that a proper pelvic exam/testing could be done.    She acknowledged understanding and reported that she would schedule an appointment in the next several days.  When she returns to clinic, consider a wet prep and an appropriate physical exam to assess for CVA tenderness and chandelier sign.

## 2018-09-06 ENCOUNTER — Encounter: Payer: Self-pay | Admitting: Family Medicine

## 2018-09-07 LAB — CERVICOVAGINAL ANCILLARY ONLY
Bacterial vaginitis: POSITIVE — AB
Candida vaginitis: NEGATIVE
Chlamydia: NEGATIVE
Neisseria Gonorrhea: NEGATIVE
Trichomonas: NEGATIVE

## 2018-09-09 ENCOUNTER — Other Ambulatory Visit (HOSPITAL_COMMUNITY)
Admission: RE | Admit: 2018-09-09 | Discharge: 2018-09-09 | Disposition: A | Discharge status: Home / Self Care | Payer: Self-pay | Source: Ambulatory Visit | Attending: Family Medicine | Admitting: Family Medicine

## 2018-09-09 ENCOUNTER — Other Ambulatory Visit: Payer: Self-pay

## 2018-09-09 ENCOUNTER — Ambulatory Visit (INDEPENDENT_AMBULATORY_CARE_PROVIDER_SITE_OTHER): Payer: Self-pay | Admitting: Family Medicine

## 2018-09-09 VITALS — BP 142/78 | Temp 97.5°F | Wt 229.0 lb

## 2018-09-09 DIAGNOSIS — N898 Other specified noninflammatory disorders of vagina: Secondary | ICD-10-CM | POA: Insufficient documentation

## 2018-09-09 DIAGNOSIS — Z1239 Encounter for other screening for malignant neoplasm of breast: Secondary | ICD-10-CM

## 2018-09-09 LAB — POCT WET PREP (WET MOUNT)
Clue Cells Wet Prep Whiff POC: NEGATIVE
Trichomonas Wet Prep HPF POC: ABSENT

## 2018-09-09 MED ORDER — NITROGLYCERIN 0.4 MG SL SUBL: 0.4000 mg | SUBLINGUAL_TABLET | SUBLINGUAL | 0 refills | Status: DC | PRN

## 2018-09-09 MED ORDER — METRONIDAZOLE 500 MG PO TABS: 500.0000 mg | ORAL_TABLET | Freq: Two times a day (BID) | ORAL | 0 refills | Status: AC

## 2018-09-09 NOTE — Patient Instructions (Signed)
Thank you for coming to see me today. It was a pleasure! Today we talked about:   Your discharge.  We will treat you for bacterial vaginosis with metronidazole for 7 days.  Please take this twice a day.  Please avoid any alcohol while on this medication.  I will call you with your other results to ensure that you do not need further treatment.  Please follow-up as needed.  If you have any questions or concerns, please do not hesitate to call the office at [phone].  Take Care,   [place] Vicy Medico, DO  Bacterial Vaginosis  Bacterial vaginosis is an infection of the vagina. It happens when too many normal germs (healthy bacteria) grow in the vagina. This infection puts you at risk for infections from sex (STIs). Treating this infection can lower your risk for some STIs. You should also treat this if you are pregnant. It can cause your baby to be born early. Follow these instructions at home: Medicines  Take over-the-counter and prescription medicines only as told by your doctor.  Take or use your antibiotic medicine as told by your doctor. Do not stop taking or using it even if you start to feel better. General instructions  If you your sexual partner is a woman, tell her that you have this infection. She needs to get treatment if she has symptoms. If you have a female partner, he does not need to be treated.  During treatment: ? Avoid sex. ? Do not douche. ? Avoid alcohol as told. ? Avoid breastfeeding as told.  Drink enough fluid to keep your pee (urine) clear or pale yellow.  Keep your vagina and butt (rectum) clean. ? Wash the area with warm water every day. ? Wipe from front to back after you use the toilet.  Keep all follow-up visits as told by your doctor. This is important. Preventing this condition  Do not douche.  Use only warm water to wash around your vagina.  Use protection when you have sex. This includes: ? Latex condoms. ? Dental dams.  Limit how many  people you have sex with. It is best to only have sex with the same person (be monogamous).  Get tested for STIs. Have your partner get tested.  Wear underwear that is cotton or lined with cotton.  Avoid tight pants and pantyhose. This is most important in summer.  Do not use any products that have nicotine or tobacco in them. These include cigarettes and e-cigarettes. If you need help quitting, ask your doctor.  Do not use illegal drugs.  Limit how much alcohol you drink. Contact a doctor if:  Your symptoms do not get better, even after you are treated.  You have more discharge or pain when you pee (urinate).  You have a fever.  You have pain in your belly (abdomen).  You have pain with sex.  Your bleed from your vagina between periods. Summary  This infection happens when too many germs (bacteria) grow in the vagina.  Treating this condition can lower your risk for some infections from sex (STIs).  You should also treat this if you are pregnant. It can cause early (premature) birth.  Do not stop taking or using your antibiotic medicine even if you start to feel better. This information is not intended to replace advice given to you by your health care provider. Make sure you discuss any questions you have with your health care provider. Document Released: 06/17/2008 Document Revised: 05/24/2016 Document Reviewed: 05/24/2016  Chartered certified accountant Patient Education  [employer].
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
## 2018-09-10 DIAGNOSIS — N898 Other specified noninflammatory disorders of vagina: Secondary | ICD-10-CM

## 2018-09-10 HISTORY — DX: Other specified noninflammatory disorders of vagina: N89.8

## 2018-09-10 LAB — CERVICOVAGINAL ANCILLARY ONLY
Chlamydia: NEGATIVE
Neisseria Gonorrhea: NEGATIVE

## 2018-09-10 NOTE — Assessment & Plan Note (Signed)
confirmed on wet prep at previous visit when patient self swab.  Negative on swab today.  However on exam patient with fishy odor and discharge that appears to be BV. G/C Marveen Reeks is pending.  Patient with mild cervical motion tenderness on exam and given that she is not high risk and has never had chlamydial infection will not treat as PID at this time, but will monitor for GC/ chlamydia results closely and treat if necessary. patient given strict return precautions. - Treatment with Flagyl 500 BID x 7 days. - F/U if symptoms not improving or getting worse.  - Will f/u on G/C Chlamydia and call in Rx if positive.  - F/U with PCP as needed.  - Return precautions including abdominal pain, fever, chills, nausea, or vomiting given.

## 2018-11-03 ENCOUNTER — Encounter: Payer: Self-pay | Admitting: Family Medicine

## 2018-11-11 ENCOUNTER — Ambulatory Visit: Payer: Self-pay | Admitting: Family Medicine

## 2018-11-12 ENCOUNTER — Ambulatory Visit: Payer: Self-pay

## 2018-11-12 ENCOUNTER — Ambulatory Visit: Payer: Self-pay | Admitting: Cardiovascular Disease

## 2018-11-17 ENCOUNTER — Other Ambulatory Visit: Payer: Self-pay

## 2018-11-17 ENCOUNTER — Ambulatory Visit (INDEPENDENT_AMBULATORY_CARE_PROVIDER_SITE_OTHER): Payer: No Typology Code available for payment source | Admitting: Family Medicine

## 2018-11-17 VITALS — BP 124/80 | HR 89 | Temp 97.9°F | Ht 68.0 in | Wt 231.0 lb

## 2018-11-17 DIAGNOSIS — M549 Dorsalgia, unspecified: Secondary | ICD-10-CM

## 2018-11-17 DIAGNOSIS — N390 Urinary tract infection, site not specified: Secondary | ICD-10-CM

## 2018-11-17 LAB — POCT URINALYSIS DIP (MANUAL ENTRY)
Bilirubin, UA: NEGATIVE
Blood, UA: NEGATIVE
GLUCOSE UA: NEGATIVE mg/dL
Nitrite, UA: NEGATIVE
Protein Ur, POC: NEGATIVE mg/dL
Spec Grav, UA: >=1.03 — AB (ref 1.010–1.025)
Urobilinogen, UA: 0.2 E.U./dL
pH, UA: 5.5 (ref 5.0–8.0)

## 2018-11-17 LAB — POCT UA - MICROSCOPIC ONLY: Epithelial cells, urine per micros: >20

## 2018-11-17 MED ORDER — NITROFURANTOIN MONOHYD MACRO 100 MG PO CAPS: 100.0000 mg | ORAL_CAPSULE | Freq: Two times a day (BID) | ORAL | 0 refills | Status: DC

## 2018-11-17 NOTE — Patient Instructions (Signed)
Urinary Tract Infection, Adult A urinary tract infection (UTI) is an infection of any part of the urinary tract. The urinary tract includes:  The kidneys.  The ureters.  The bladder.  The urethra. These organs make, store, and get rid of pee (urine) in the body. What are the causes? This is caused by germs (bacteria) in your genital area. These germs grow and cause swelling (inflammation) of your urinary tract. What increases the risk? You are more likely to develop this condition if:  You have a small, thin tube (catheter) to drain pee.  You cannot control when you pee or poop (incontinence).  You are female, and: ? You use these methods to prevent pregnancy: ? A medicine that kills sperm (spermicide). ? A device that blocks sperm (diaphragm). ? You have low levels of a female hormone (estrogen). ? You are pregnant.  You have genes that add to your risk.  You are sexually active.  You take antibiotic medicines.  You have trouble peeing because of: ? A prostate that is bigger than normal, if you are female. ? A blockage in the part of your body that drains pee from the bladder (urethra). ? A kidney stone. ? A nerve condition that affects your bladder (neurogenic bladder). ? Not getting enough to drink. ? Not peeing often enough.  You have other conditions, such as: ? Diabetes. ? A weak disease-fighting system (immune system). ? Sickle cell disease. ? Gout. ? Injury of the spine. What are the signs or symptoms? Symptoms of this condition include:  Needing to pee right away (urgently).  Peeing often.  Peeing small amounts often.  Pain or burning when peeing.  Blood in the pee.  Pee that smells bad or not like normal.  Trouble peeing.  Pee that is cloudy.  Fluid coming from the vagina, if you are female.  Pain in the belly or lower back. Other symptoms include:  Throwing up (vomiting).  No urge to eat.  Feeling mixed up (confused).  Being tired  and grouchy (irritable).  A fever.  Watery poop (diarrhea). How is this treated? This condition may be treated with:  Antibiotic medicine.  Other medicines.  Drinking enough water. Follow these instructions at home:  Medicines  Take over-the-counter and prescription medicines only as told by your doctor.  If you were prescribed an antibiotic medicine, take it as told by your doctor. Do not stop taking it even if you start to feel better. General instructions  Make sure you: ? Pee until your bladder is empty. ? Do not hold pee for a long time. ? Empty your bladder after sex. ? Wipe from front to back after pooping if you are a female. Use each tissue one time when you wipe.  Drink enough fluid to keep your pee pale yellow.  Keep all follow-up visits as told by your doctor. This is important. Contact a doctor if:  You do not get better after 1-2 days.  Your symptoms go away and then come back. Get help right away if:  You have very bad back pain.  You have very bad pain in your lower belly.  You have a fever.  You are sick to your stomach (nauseous).  You are throwing up. Summary  A urinary tract infection (UTI) is an infection of any part of the urinary tract.  This condition is caused by germs in your genital area.  There are many risk factors for a UTI. These include having a small, thin   tube to drain pee and not being able to control when you pee or poop.  Treatment includes antibiotic medicines for germs.  Drink enough fluid to keep your pee pale yellow. This information is not intended to replace advice given to you by your health care provider. Make sure you discuss any questions you have with your health care provider. Document Released: 02/25/2008 Document Revised: 03/18/2018 Document Reviewed: 03/18/2018 Elsevier Interactive Patient Education  2019 [employer] Inc.  

## 2018-11-17 NOTE — Progress Notes (Signed)
   Subjective:   Patient ID: [name]    DOB: 1966-05-22, 53 y.o. female   MRN: [id number]  CC: concern for UTI   HPI: [name] is a 53 y.o. female who presents to clinic today for the following issue.  Concern for UTI  Symptoms present x 3 weeks duration, primarily frequency and urgency but no dysuria.   Endorses suprapubic tenderness and abdominal fullness.  Taking Aleve for pain control.  Has had h/o UTI in the past.   She reports she does not drink a lot of water.   No fever, chills, nausea or vomiting.   Has had some back pain but no flank pain.   Denies hematuria.   ROS: See HPI for pertinent ROS.  Social: pt is a never smoker.  Medications reviewed. Objective:   BP 124/80   Pulse 89   Temp 97.9 F (36.6 C) (Oral)   Ht 5\' 8"  (1.727 m)   Wt 231 lb (104.8 kg)   LMP 10/06/2013   SpO2 97%   BMI 35.12 kg/m  Vitals and nursing note reviewed.  General: pleasant 53 yo female, NAD  Neck: supple CV: RRR no MRG  Lungs: CTAB, normal effort  Abdomen: soft, NTND, no CVA tenderness, mild suprapubic tenderness present with palpation, +bs  Skin: warm, dry, no rash  Extremities: warm and well perfused   Assessment & Plan:   Concern for UTI  Reports 3 weeks of frequency and suprapubic pain.  UA collected in office today - revealed moderate 2+ leukocytes, negative for nitrite.   Given UA findings and symptoms, will treat with course of antibiotics.  No systemic symptoms or CVA tenderness to suggest pyelonephritis.  -Rx: Macrobid 100 mg BID x 10 days  -urine sent for cx  -Advised to increase water intake -Handout provided and return precautions discussed -Follow up if symptoms worsen or do not improve   Orders Placed This Encounter  Procedures  . Urine Culture  . POCT urinalysis dipstick  . POCT UA - Microscopic Only   Meds ordered this encounter  Medications  . nitrofurantoin, macrocrystal-monohydrate, (MACROBID) 100 MG capsule    Sig: Take 1  capsule (100 mg total) by mouth 2 (two) times daily.    Dispense:  20 capsule    Refill:  0   Lovenia Kim, MD [facility] PGY-3

## 2018-11-24 ENCOUNTER — Ambulatory Visit: Payer: Self-pay | Admitting: Cardiovascular Disease

## 2018-11-26 ENCOUNTER — Ambulatory Visit: Payer: Self-pay | Admitting: Family Medicine

## 2019-01-31 ENCOUNTER — Ambulatory Visit (HOSPITAL_COMMUNITY)
Admission: RE | Admit: 2019-01-31 | Discharge: 2019-01-31 | Disposition: A | Discharge status: Home / Self Care | Payer: No Typology Code available for payment source | Source: Ambulatory Visit | Attending: Family Medicine | Admitting: Family Medicine

## 2019-01-31 DIAGNOSIS — R600 Localized edema: Secondary | ICD-10-CM | POA: Insufficient documentation

## 2019-01-31 DIAGNOSIS — E8770 Fluid overload, unspecified: Secondary | ICD-10-CM | POA: Insufficient documentation

## 2019-01-31 DIAGNOSIS — R0789 Other chest pain: Secondary | ICD-10-CM | POA: Insufficient documentation

## 2019-01-31 DIAGNOSIS — I10 Essential (primary) hypertension: Secondary | ICD-10-CM | POA: Insufficient documentation

## 2019-01-31 DIAGNOSIS — J0100 Acute maxillary sinusitis, unspecified: Secondary | ICD-10-CM | POA: Insufficient documentation

## 2019-01-31 DIAGNOSIS — N3000 Acute cystitis without hematuria: Secondary | ICD-10-CM | POA: Insufficient documentation

## 2019-01-31 DIAGNOSIS — I208 Other forms of angina pectoris: Secondary | ICD-10-CM | POA: Insufficient documentation

## 2019-02-01 ENCOUNTER — Ambulatory Visit (HOSPITAL_COMMUNITY)
Admission: RE | Admit: 2019-02-01 | Discharge: 2019-02-01 | Disposition: A | Discharge status: Home / Self Care | Payer: No Typology Code available for payment source | Source: Ambulatory Visit | Attending: Family Medicine | Admitting: Family Medicine

## 2019-02-01 ENCOUNTER — Encounter: Payer: Self-pay | Admitting: Family Medicine

## 2019-02-01 ENCOUNTER — Other Ambulatory Visit: Payer: Self-pay

## 2019-02-01 ENCOUNTER — Ambulatory Visit (INDEPENDENT_AMBULATORY_CARE_PROVIDER_SITE_OTHER): Payer: Self-pay | Admitting: Family Medicine

## 2019-02-01 VITALS — BP 138/78

## 2019-02-01 DIAGNOSIS — E8770 Fluid overload, unspecified: Secondary | ICD-10-CM

## 2019-02-01 DIAGNOSIS — I208 Other forms of angina pectoris: Secondary | ICD-10-CM

## 2019-02-01 DIAGNOSIS — N3000 Acute cystitis without hematuria: Secondary | ICD-10-CM

## 2019-02-01 DIAGNOSIS — J0100 Acute maxillary sinusitis, unspecified: Secondary | ICD-10-CM

## 2019-02-01 DIAGNOSIS — R300 Dysuria: Secondary | ICD-10-CM

## 2019-02-01 DIAGNOSIS — R0789 Other chest pain: Secondary | ICD-10-CM | POA: Insufficient documentation

## 2019-02-01 DIAGNOSIS — I10 Essential (primary) hypertension: Secondary | ICD-10-CM

## 2019-02-01 HISTORY — DX: Fluid overload, unspecified: E87.70

## 2019-02-01 LAB — POCT URINALYSIS DIP (CLINITEK)
Bilirubin, UA: NEGATIVE
Blood, UA: NEGATIVE
Glucose, UA: NEGATIVE mg/dL
Ketones, POC UA: NEGATIVE mg/dL
Nitrite, UA: NEGATIVE
POC PROTEIN,UA: NEGATIVE
Spec Grav, UA: >=1.03 — AB (ref 1.010–1.025)
Urobilinogen, UA: 1 E.U./dL
pH, UA: 6.5 (ref 5.0–8.0)

## 2019-02-01 MED ORDER — FLUTICASONE PROPIONATE 50 MCG/ACT NA SUSP: 2.0000 | Freq: Every day | NASAL | 6 refills | Status: DC

## 2019-02-01 MED ORDER — HYDROCHLOROTHIAZIDE 25 MG PO TABS: ORAL_TABLET | ORAL | 0 refills | Status: DC

## 2019-02-01 MED ORDER — NITROFURANTOIN MONOHYD MACRO 100 MG PO CAPS: 100.0000 mg | ORAL_CAPSULE | Freq: Two times a day (BID) | ORAL | 0 refills | Status: DC

## 2019-02-01 MED ORDER — NITROGLYCERIN 0.4 MG SL SUBL: 0.4000 mg | SUBLINGUAL_TABLET | SUBLINGUAL | 0 refills | Status: DC | PRN

## 2019-02-01 NOTE — Assessment & Plan Note (Signed)
Reported duration of symptoms is about 7 days.  At this point, this is likely a viral sinusitis.  Will treat symptomatically with Flonase.  She was encouraged to follow-up in 1 week if her symptoms have not improved.

## 2019-02-01 NOTE — Assessment & Plan Note (Addendum)
Based on her history of CAD and hypertension, I am concerned that this may be new onset acute decompensated heart failure.  Most recent heart imaging was done in 2018, his cardiac catheterization showed a normal EF.  Based on physical exam of worsening LE edema, possible basilar crackles on auscultation and elevated JVD, she may be experiencing heart failure. -HCTZ refilled, continue beta-blocker and ACE inhibitor -compression socks/stockings -Follow-up BMP and BNP -Consider outpatient diuresis pending labs -Ambulatory referral to cardiology placed

## 2019-02-01 NOTE — Patient Instructions (Addendum)
1) Sinusitis - It is likely viral.  Give [place] a call if it is still bothering you in one week. Use tylenol for headaches in the meantime and you can try Flonase for symptomatic relief.   2) Stomach pain and burning with urination - Your clinic test showed that you may have an infection.  You can take the antibiotic noted in your paperwork for the next 5 days.  3) Leg swelling - I will check you heart medication and have you follow up with cardiology. For now, you can wear compression stockings.  4) Chest/neck pain - Based on the evaluation we have done in the clinic, this is not concerning for heart issues.  I would like you to follow up with cardiology to ensure that you're getting good care for your coronary artery disease.

## 2019-02-01 NOTE — Assessment & Plan Note (Signed)
-  Nitrofurantoin 5 days

## 2019-02-01 NOTE — Progress Notes (Signed)
Subjective:  [name] is a 53 y.o. female who presents to the [facility] today with a chief complaint of sinus discomfort.   HPI: Neck pain/chest pain, concern for stable angina [name] has experienced neck pain since her cardiac catheterization in 02/2017.  It has been very mild and not bothersome until the past month or 2.  1 to 2 months ago, she noticed a worsening of this neck pain with associated chest pain and since that time it has remained stable.  Her neck pain is described as an irritating dull, throbbing pain.  The associated chest pain is located in her left upper chest at about the second or third intercostal space with radiation to the left armpit with some sensation down her arm.  She denies radiation to her back or jaw.  Per report, this discomfort will worsen with exertion and improved with rest.  She denies taking medication that is significantly affected this pain.  She is prescribed sublingual nitroglycerin but has not had any at home recently because it is expensive.  She does have a history of CAD there is never experienced a heart attack.  Has been over 1 year since she has seen a cardiologist because of her health insurance situation.  Concern for fluid overload She is experienced significant lower extremity edema for years.  Typically her right leg is slightly more swollen than her left leg.  Very often, she notices that her lower extremity swelling is worse at the end of a long day of standing.  In the past several weeks, she has noticed an increase in her lower extremity swelling which is led to him discomfort and an increase in her chronic left knee pain.  The stricture is odd because her current job requires very little standing and she spends most of her day seated.  She has been wearing knee-high socks which help suppress her swelling.  She is also noted that she currently sleeps with 3 pillows because she is unable to sleep with her back flat to the bed as  she feels she is "drowning."  Though it is difficult to say for certainty, she thinks that she has decreased exercise tolerance in the past week or 2 and is not able to walk as far as usual before becoming short of breath.  Sinusitis For the past week, she is noticed significant nasal congestion on her right side.  In that time she believes that her congestion has significantly increased and she is noticed mild irritation of her right ear.  She is not experiencing any sneezing, coughing or sore throat.  She denies fever.  UTI [name] has been seen multiple times previously for possible UTIs.  She reports that she is experiencing mild suprapubic discomfort in addition to mild dysuria at the beginning of her urination.  She also notices nausea at times.  She denies fever, back pain, chills.  Chief Complaint noted Review of Symptoms - see HPI PMH - CAD, HTN    Objective:  Physical Exam: BP 138/78    LMP 10/06/2013    Gen: Sitting comfortably in the chair in the exam room.  Alert oriented speaking comfortably without discomfort.  Able to stand up and walk around the exam room and step up to the exam table. HEENT JVD noted about 1 cm below the angle of the jaw.  Maxillary and frontal sinus tender to palpation on the right side. CV: RRR with no murmurs appreciated, left radial pulse strong.  No reproducible chest pain with palpation. Pulm: Difficult lung exam.  No wheezing or strid  Rales noted in left lower lung field. GI: Normal bowel sounds present. Soft, Nontender, Nondistended. MSk: 2+ pitting edema up to the tibial tuberosity.  Edema equal bilaterally. Skin: warm, dry  Results for orders placed or performed in visit on 02/01/19 (from the past 72 hour(s))  POCT URINALYSIS DIP (CLINITEK)     Status: Abnormal   Collection Time: 02/01/19  5:09 PM  Result Value Ref Range   Color, UA yellow yellow   Clarity, UA hazy (A) clear   Glucose, UA negative negative mg/dL   Bilirubin, UA  negative negative   Ketones, POC UA negative negative mg/dL   Spec Grav, UA >=1.030 (A) 1.010 - 1.025   Blood, UA negative negative   pH, UA 6.5 5.0 - 8.0   POC PROTEIN,UA negative negative, trace   Urobilinogen, UA 1.0 0.2 or 1.0 E.U./dL   Nitrite, UA Negative Negative   Leukocytes, UA Trace (A) Negative     Assessment/Plan:  Stable angina (HCC) Based on her history of coronary artery disease and hypertension, I am concerned is heart related though it may simply be MSK pain.  EKG performed in clinic today showed no ST changes, T wave changes, or pathological Q waves.  She was not experiencing any significant discomfort in clinic.   - follow-up BMP and BNP drawn in clinic today. - New referral to ambulatory cardiology - Refill nitroglycerin proper use reviewed - Continue cardiac medications and, refills as ordered  Acute cystitis without hematuria -Nitrofurantoin 5 days  Hypervolemia Based on her history of CAD and hypertension, I am concerned that this may be new onset acute decompensated heart failure.  Most recent heart imaging was done in 2018, his cardiac catheterization showed a normal EF.  Based on physical exam of worsening LE edema, possible basilar crackles on auscultation and elevated JVD, she may be experiencing heart failure. -HCTZ refilled, continue beta-blocker and ACE inhibitor -compression socks/stockings -Follow-up BMP and BNP -Consider outpatient diuresis pending labs -Ambulatory referral to cardiology placed  Acute non-recurrent maxillary sinusitis Reported duration of symptoms is about 7 days.  At this point, this is likely a viral sinusitis.  Will treat symptomatically with Flonase.  She was encouraged to follow-up in 1 week if her symptoms have not improved.

## 2019-02-01 NOTE — Assessment & Plan Note (Addendum)
Based on her history of coronary artery disease and hypertension, I am concerned is heart related though it may simply be MSK pain.  EKG performed in clinic today showed no ST changes, T wave changes, or pathological Q waves.  She was not experiencing any significant discomfort in clinic.   - follow-up BMP and BNP drawn in clinic today. - New referral to ambulatory cardiology - Refill nitroglycerin proper use reviewed - Continue cardiac medications and, refills as ordered

## 2019-02-02 LAB — BASIC METABOLIC PANEL
BUN/Creatinine Ratio: 14 (ref 9–23)
BUN: 15 mg/dL (ref 6–24)
CO2: 23 mmol/L (ref 20–29)
Calcium: 9.8 mg/dL (ref 8.7–10.2)
Chloride: 104 mmol/L (ref 96–106)
Creatinine, Ser: 1.04 mg/dL — ABNORMAL HIGH (ref 0.57–1.00)
GFR calc Af Amer: 71 mL/min/{1.73_m2} (ref >59)
GFR calc non Af Amer: 61 mL/min/{1.73_m2} (ref >59)
Glucose: 132 mg/dL — ABNORMAL HIGH (ref 65–99)
Potassium: 3.8 mmol/L (ref 3.5–5.2)
Sodium: 142 mmol/L (ref 134–144)

## 2019-02-02 LAB — BRAIN NATRIURETIC PEPTIDE: BNP: 43.3 pg/mL (ref 0.0–100.0)

## 2019-02-03 ENCOUNTER — Other Ambulatory Visit: Payer: Self-pay

## 2019-02-03 ENCOUNTER — Telehealth (INDEPENDENT_AMBULATORY_CARE_PROVIDER_SITE_OTHER): Payer: Self-pay | Admitting: Family Medicine

## 2019-02-03 ENCOUNTER — Encounter: Payer: Self-pay | Admitting: Family Medicine

## 2019-02-03 DIAGNOSIS — J0100 Acute maxillary sinusitis, unspecified: Secondary | ICD-10-CM

## 2019-02-03 LAB — URINE CULTURE

## 2019-02-03 NOTE — Progress Notes (Addendum)
Uncertain Telemedicine Visit  Patient consented to have virtual visit. Method of visit: Telephone  Encounter participants: Patient: [name] - located at home Provider: Matilde Haymaker - located at [facility] Others (if applicable): none  Chief Complaint: sore throat, ear discomfort, headache  HPI:  [name] was last seen in clinic on 5/12.  At that time she was diagnosed with a viral sinusitis.  Since her clinic visit, she has noted continued right-sided sinus pressure, right ear discomfort and new post nasal drip with a sore throat in the morning.  She noted that she had a very sore throat in the morning and has been alternating cold beverages and hot beverages to attempt to soothe her throat.  She is also been taking Tylenol for her ongoing headache.  The headache is also been present for roughly the past week and is unchanged.  Again, the only new symptom she is reporting is the postnasal drip with sore throat noted this morning.  She specifically denies fevers, chills, shortness of breath.  ROS: per HPI  Pertinent PMHx: none  Exam:  Respiratory: Able to complete long sentences without difficulty.  No shortness of breath noted.  No wheezing noted during phone call.  Assessment/Plan:  Acute non-recurrent maxillary sinusitis She was informed that her symptoms are likely due to a viral sinusitis.  Her new symptoms of postnasal drip and sore throat are likely secondary to drainage from her sinuses.  She was encouraged to continue symptomatic treatment with sore throat with cold liquids or tea with honey.  She is also told that lozenges with menthol may also have a soothing effect.  She was encouraged to follow-up if she continued to experience symptoms after 1 week. -Alternate Tylenol and Motrin for discomfort/headache    Time spent during visit with patient: 10 minutes

## 2019-02-03 NOTE — Progress Notes (Signed)
Contacted pt prior to visit, states she has now had drainage and sore throat which is new from previous visit. She is having headache and facial soreness on the rt side. April Zimmerman Rumple, CMA

## 2019-02-03 NOTE — Assessment & Plan Note (Signed)
She was informed that her symptoms are likely due to a viral sinusitis.  Her new symptoms of postnasal drip and sore throat are likely secondary to drainage from her sinuses.  She was encouraged to continue symptomatic treatment with sore throat with cold liquids or tea with honey.  She is also told that lozenges with menthol may also have a soothing effect.  She was encouraged to follow-up if she continued to experience symptoms after 1 week. -Alternate Tylenol and Motrin for discomfort/headache

## 2019-02-04 ENCOUNTER — Telehealth: Payer: No Typology Code available for payment source | Admitting: Family Medicine

## 2019-03-02 ENCOUNTER — Encounter: Payer: Self-pay | Admitting: Family Medicine

## 2019-03-02 ENCOUNTER — Ambulatory Visit (INDEPENDENT_AMBULATORY_CARE_PROVIDER_SITE_OTHER): Payer: Self-pay | Admitting: Family Medicine

## 2019-03-02 ENCOUNTER — Other Ambulatory Visit: Payer: Self-pay

## 2019-03-02 VITALS — BP 120/82 | HR 80 | Ht 68.0 in | Wt 234.1 lb

## 2019-03-02 DIAGNOSIS — R109 Unspecified abdominal pain: Secondary | ICD-10-CM

## 2019-03-02 DIAGNOSIS — N3000 Acute cystitis without hematuria: Secondary | ICD-10-CM

## 2019-03-02 DIAGNOSIS — N83202 Unspecified ovarian cyst, left side: Secondary | ICD-10-CM

## 2019-03-02 LAB — POCT UA - MICROSCOPIC ONLY: Epithelial cells, urine per micros: >20

## 2019-03-02 LAB — POCT URINALYSIS DIP (MANUAL ENTRY)
Bilirubin, UA: NEGATIVE
Glucose, UA: NEGATIVE mg/dL
Ketones, POC UA: NEGATIVE mg/dL
Nitrite, UA: NEGATIVE
Protein Ur, POC: NEGATIVE mg/dL
Spec Grav, UA: 1.015 (ref 1.010–1.025)
Urobilinogen, UA: 1 E.U./dL
pH, UA: 6.5 (ref 5.0–8.0)

## 2019-03-02 MED ORDER — CEPHALEXIN 500 MG PO CAPS: 500.0000 mg | ORAL_CAPSULE | Freq: Two times a day (BID) | ORAL | 0 refills | Status: AC

## 2019-03-02 MED ORDER — TRAMADOL HCL 50 MG PO TABS: 50.0000 mg | ORAL_TABLET | Freq: Three times a day (TID) | ORAL | 0 refills | Status: AC | PRN
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
## 2019-03-02 NOTE — Patient Instructions (Addendum)
Thank you for coming to see me today. It was a pleasure! Today we talked about:   We will call you with your lab results.  Please go have an ultrasound done of your pelvis to evaluate your ovarian cysts.    You also have signs of a UTI on your exam.  I have sent in an antibiotic called Keflex to your pharmacy.  Please take 1 tablet twice daily for 7 days.   Please follow-up as needed.  If you have any questions or concerns, please do not hesitate to call the office at [phone].  Take Care,   [place] Devaunte Gasparini, DO

## 2019-03-03 LAB — BASIC METABOLIC PANEL
BUN/Creatinine Ratio: 19 (ref 9–23)
BUN: 14 mg/dL (ref 6–24)
CO2: 24 mmol/L (ref 20–29)
Calcium: 10 mg/dL (ref 8.7–10.2)
Chloride: 105 mmol/L (ref 96–106)
Creatinine, Ser: 0.75 mg/dL (ref 0.57–1.00)
GFR calc Af Amer: 105 mL/min/{1.73_m2} (ref >59)
GFR calc non Af Amer: 91 mL/min/{1.73_m2} (ref >59)
Glucose: 94 mg/dL (ref 65–99)
Potassium: 3.9 mmol/L (ref 3.5–5.2)
Sodium: 143 mmol/L (ref 134–144)

## 2019-03-03 NOTE — Assessment & Plan Note (Addendum)
Based on patient's sx of increased frequency, urgency will treat with keflex x7 days. UA with dirty catch and did not send for cx. Patient previously with multpiple species on culture. Patient had subjective fever 3 days ago, but afebrile since. Trace blood on UA but not significant with microscopy.

## 2019-03-03 NOTE — Assessment & Plan Note (Signed)
Patient with previous cysts that had been worked up and surgery recommended in 09/2017 but patient backed out. She would liked second opinion. Now having increased pain in lower abdomen and back. CT on 01/26/2018 showed: Stable 5.0 cm left ovarian cystic lesion, without overt malignant features on CT. Assuming postmenopausal status, annual pelvic ultrasound is suggested. -repeat pelvic US as recommended and for increasing pain per patient -tramadol for breakthrough pain provided as patient has GI upset with NSAIDs- discussed that NSAID's preferred and tramadol is not good for extended use -referral for gynecology as patient would like second opinion

## 2019-03-04 ENCOUNTER — Emergency Department (HOSPITAL_COMMUNITY): Payer: Self-pay

## 2019-03-04 ENCOUNTER — Emergency Department (HOSPITAL_COMMUNITY)
Admission: EM | Admit: 2019-03-04 | Discharge: 2019-03-04 | Disposition: A | Discharge status: Home / Self Care | Payer: Self-pay | Attending: Emergency Medicine | Admitting: Emergency Medicine

## 2019-03-04 ENCOUNTER — Other Ambulatory Visit: Payer: Self-pay

## 2019-03-04 ENCOUNTER — Encounter (HOSPITAL_COMMUNITY): Payer: Self-pay | Admitting: *Deleted

## 2019-03-04 ENCOUNTER — Ambulatory Visit
Payer: No Typology Code available for payment source | Admitting: Student in an Organized Health Care Education/Training Program

## 2019-03-04 DIAGNOSIS — I10 Essential (primary) hypertension: Secondary | ICD-10-CM | POA: Insufficient documentation

## 2019-03-04 DIAGNOSIS — R109 Unspecified abdominal pain: Secondary | ICD-10-CM

## 2019-03-04 DIAGNOSIS — Z955 Presence of coronary angioplasty implant and graft: Secondary | ICD-10-CM | POA: Insufficient documentation

## 2019-03-04 DIAGNOSIS — Z91040 Latex allergy status: Secondary | ICD-10-CM | POA: Insufficient documentation

## 2019-03-04 DIAGNOSIS — N3001 Acute cystitis with hematuria: Secondary | ICD-10-CM | POA: Insufficient documentation

## 2019-03-04 DIAGNOSIS — I25118 Atherosclerotic heart disease of native coronary artery with other forms of angina pectoris: Secondary | ICD-10-CM | POA: Insufficient documentation

## 2019-03-04 DIAGNOSIS — Z7982 Long term (current) use of aspirin: Secondary | ICD-10-CM | POA: Insufficient documentation

## 2019-03-04 DIAGNOSIS — Z79899 Other long term (current) drug therapy: Secondary | ICD-10-CM | POA: Insufficient documentation

## 2019-03-04 LAB — COMPREHENSIVE METABOLIC PANEL
ALT: 15 U/L (ref 0–44)
AST: 13 U/L — ABNORMAL LOW (ref 15–41)
Albumin: 4 g/dL (ref 3.5–5.0)
Alkaline Phosphatase: 61 U/L (ref 38–126)
Anion gap: 11 (ref 5–15)
BUN: 12 mg/dL (ref 6–20)
CO2: 26 mmol/L (ref 22–32)
Calcium: 10.2 mg/dL (ref 8.9–10.3)
Chloride: 104 mmol/L (ref 98–111)
Creatinine, Ser: 0.87 mg/dL (ref 0.44–1.00)
GFR calc Af Amer: >60 mL/min (ref >60)
GFR calc non Af Amer: >60 mL/min (ref >60)
Glucose, Bld: 111 mg/dL — ABNORMAL HIGH (ref 70–99)
Potassium: 3.4 mmol/L — ABNORMAL LOW (ref 3.5–5.1)
Sodium: 141 mmol/L (ref 135–145)
Total Bilirubin: 0.9 mg/dL (ref 0.3–1.2)
Total Protein: 7.1 g/dL (ref 6.5–8.1)

## 2019-03-04 LAB — CBC WITH DIFFERENTIAL/PLATELET
Abs Immature Granulocytes: 0.02 10*3/uL (ref 0.00–0.07)
Basophils Absolute: 0 10*3/uL (ref 0.0–0.1)
Basophils Relative: 0 %
Eosinophils Absolute: 0.1 10*3/uL (ref 0.0–0.5)
Eosinophils Relative: 2 %
HCT: 40.4 % (ref 36.0–46.0)
Hemoglobin: 12.9 g/dL (ref 12.0–15.0)
Immature Granulocytes: 0 %
Lymphocytes Relative: 19 %
Lymphs Abs: 1.6 10*3/uL (ref 0.7–4.0)
MCH: 28.4 pg (ref 26.0–34.0)
MCHC: 31.9 g/dL (ref 30.0–36.0)
MCV: 88.8 fL (ref 80.0–100.0)
Monocytes Absolute: 0.6 10*3/uL (ref 0.1–1.0)
Monocytes Relative: 7 %
Neutro Abs: 6 10*3/uL (ref 1.7–7.7)
Neutrophils Relative %: 72 %
Platelets: 308 10*3/uL (ref 150–400)
RBC: 4.55 MIL/uL (ref 3.87–5.11)
RDW: 13.4 % (ref 11.5–15.5)
WBC: 8.4 10*3/uL (ref 4.0–10.5)
nRBC: 0 % (ref 0.0–0.2)

## 2019-03-04 LAB — URINALYSIS, ROUTINE W REFLEX MICROSCOPIC
Bilirubin Urine: NEGATIVE
Glucose, UA: NEGATIVE mg/dL
Hgb urine dipstick: NEGATIVE
Ketones, ur: NEGATIVE mg/dL
Nitrite: NEGATIVE
Protein, ur: NEGATIVE mg/dL
Specific Gravity, Urine: 1.013 (ref 1.005–1.030)
pH: 6 (ref 5.0–8.0)

## 2019-03-04 LAB — LIPASE, BLOOD: Lipase: 24 U/L (ref 11–51)

## 2019-03-04 MED ORDER — SODIUM CHLORIDE 0.9 % IV SOLN
1.0000 g | Freq: Once | INTRAVENOUS | Status: AC
  Administered 2019-03-04: 1 g via INTRAVENOUS
  Filled 2019-03-04: qty 10

## 2019-03-04 MED ORDER — SODIUM CHLORIDE 0.9 % IV BOLUS
1000.0000 mL | Freq: Once | INTRAVENOUS | Status: AC
  Administered 2019-03-04: 1000 mL via INTRAVENOUS

## 2019-03-04 MED ORDER — KETOROLAC TROMETHAMINE 30 MG/ML IJ SOLN
30.0000 mg | Freq: Once | INTRAMUSCULAR | Status: AC
  Administered 2019-03-04: 15:00:00 30 mg via INTRAVENOUS
  Filled 2019-03-04: qty 1

## 2019-03-04 MED ORDER — ONDANSETRON HCL 4 MG/2ML IJ SOLN
4.0000 mg | Freq: Once | INTRAMUSCULAR | Status: AC
  Administered 2019-03-04: 4 mg via INTRAVENOUS
  Filled 2019-03-04: qty 2

## 2019-03-04 MED ORDER — MORPHINE SULFATE (PF) 4 MG/ML IV SOLN
4.0000 mg | Freq: Once | INTRAVENOUS | Status: AC
  Administered 2019-03-04: 4 mg via INTRAVENOUS
  Filled 2019-03-04: qty 1

## 2019-03-04 MED ORDER — IOHEXOL 300 MG/ML  SOLN
100.0000 mL | Freq: Once | INTRAMUSCULAR | Status: AC | PRN
  Administered 2019-03-04: 14:00:00 100 mL via INTRAVENOUS
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
## 2019-03-04 NOTE — Discharge Instructions (Signed)
You were evaluated today for flank pain and urinary symptoms.  Urinalysis was consistent with infection.  Your CT scan did not show any evidence of stones, infection of your kidney.  Your ultrasound of your pelvis was negative for your right-sided abdominal pain however you do have a left ovarian cyst.  Please start taking the antibiotics given to you by your PCP, Keflex.  Also take the pain medicine that was prescribed to you by your PCP.  I have written you out of work until he can follow-up with your PCP on Monday.  If you develop worsening pain, fever, multiple episodes of emesis please return to emergency department evaluation.

## 2019-03-04 NOTE — ED Triage Notes (Signed)
Pain in right flank, states she has a history of ovarian cysts

## 2019-03-05 LAB — URINE CULTURE: Culture: <10000 — AB

## 2019-03-16 ENCOUNTER — Other Ambulatory Visit: Payer: Self-pay

## 2019-03-16 ENCOUNTER — Ambulatory Visit
Admission: RE | Admit: 2019-03-16 | Discharge: 2019-03-16 | Disposition: A | Discharge status: Home / Self Care | Payer: Self-pay | Source: Ambulatory Visit | Attending: Family Medicine | Admitting: Family Medicine

## 2019-03-16 DIAGNOSIS — R109 Unspecified abdominal pain: Secondary | ICD-10-CM

## 2019-03-18 ENCOUNTER — Ambulatory Visit: Payer: Self-pay | Admitting: Family Medicine

## 2019-03-21 ENCOUNTER — Ambulatory Visit: Payer: Self-pay | Admitting: Obstetrics & Gynecology

## 2019-03-22 ENCOUNTER — Ambulatory Visit: Payer: Self-pay | Admitting: Women's Health

## 2019-03-24 ENCOUNTER — Ambulatory Visit: Payer: Self-pay | Admitting: Family Medicine

## 2019-03-31 ENCOUNTER — Other Ambulatory Visit: Payer: Self-pay

## 2019-03-31 ENCOUNTER — Ambulatory Visit: Payer: Self-pay | Admitting: Obstetrics & Gynecology

## 2019-04-04 ENCOUNTER — Ambulatory Visit: Payer: Self-pay | Admitting: Family Medicine

## 2019-05-04 ENCOUNTER — Encounter: Payer: Self-pay | Admitting: Adult Health

## 2019-05-10 ENCOUNTER — Encounter: Payer: Self-pay | Admitting: Women's Health

## 2019-06-01 ENCOUNTER — Other Ambulatory Visit: Payer: Self-pay | Admitting: Family Medicine

## 2019-06-01 DIAGNOSIS — I10 Essential (primary) hypertension: Secondary | ICD-10-CM

## 2019-06-01 NOTE — Telephone Encounter (Signed)
Patient needs refill for Losartan 100 mg because she is out of these meds.  Please call in to [facility], [address].  Please let her know when this is called in, phone [phone].

## 2019-06-02 NOTE — Telephone Encounter (Signed)
Per request, please call pt and inform her that her medication has been refilled.  Matilde Haymaker, MD

## 2019-06-30 ENCOUNTER — Other Ambulatory Visit: Payer: Self-pay | Admitting: Family Medicine

## 2019-06-30 DIAGNOSIS — I10 Essential (primary) hypertension: Secondary | ICD-10-CM

## 2019-07-14 ENCOUNTER — Other Ambulatory Visit (HOSPITAL_COMMUNITY)
Admission: RE | Admit: 2019-07-14 | Discharge: 2019-07-14 | Disposition: A | Discharge status: Home / Self Care | Payer: Self-pay | Source: Ambulatory Visit | Attending: Family Medicine | Admitting: Family Medicine

## 2019-07-14 ENCOUNTER — Other Ambulatory Visit: Payer: Self-pay

## 2019-07-14 ENCOUNTER — Ambulatory Visit (INDEPENDENT_AMBULATORY_CARE_PROVIDER_SITE_OTHER): Payer: Self-pay | Admitting: Family Medicine

## 2019-07-14 VITALS — BP 142/84 | HR 74

## 2019-07-14 DIAGNOSIS — R300 Dysuria: Secondary | ICD-10-CM | POA: Insufficient documentation

## 2019-07-14 DIAGNOSIS — N898 Other specified noninflammatory disorders of vagina: Secondary | ICD-10-CM

## 2019-07-14 LAB — POCT URINALYSIS DIP (MANUAL ENTRY)
Bilirubin, UA: NEGATIVE
Blood, UA: NEGATIVE
Glucose, UA: NEGATIVE mg/dL
Ketones, POC UA: NEGATIVE mg/dL
Leukocytes, UA: NEGATIVE
Nitrite, UA: NEGATIVE
Protein Ur, POC: NEGATIVE mg/dL
Spec Grav, UA: >=1.03 — AB (ref 1.010–1.025)
Urobilinogen, UA: 0.2 E.U./dL
pH, UA: 5.5 (ref 5.0–8.0)

## 2019-07-14 LAB — POCT WET PREP (WET MOUNT)
Clue Cells Wet Prep Whiff POC: POSITIVE
Trichomonas Wet Prep HPF POC: ABSENT

## 2019-07-14 MED ORDER — METRONIDAZOLE 500 MG PO TABS: 500.0000 mg | ORAL_TABLET | Freq: Two times a day (BID) | ORAL | 0 refills | Status: DC

## 2019-07-16 LAB — URINE CULTURE: Organism ID, Bacteria: NO GROWTH

## 2019-07-18 ENCOUNTER — Encounter: Payer: Self-pay | Admitting: Family Medicine
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
## 2019-07-19 ENCOUNTER — Encounter: Payer: Self-pay | Admitting: Family Medicine

## 2019-07-20 ENCOUNTER — Encounter: Payer: Self-pay | Admitting: Family Medicine

## 2019-07-20 LAB — CERVICOVAGINAL ANCILLARY ONLY
Chlamydia: NEGATIVE
Comment: NEGATIVE
Comment: NORMAL
Neisseria Gonorrhea: NEGATIVE

## 2019-07-21 ENCOUNTER — Telehealth: Payer: Self-pay | Admitting: Family Medicine

## 2019-07-21 ENCOUNTER — Other Ambulatory Visit: Payer: Self-pay | Admitting: Family Medicine

## 2019-07-21 MED ORDER — METRONIDAZOLE 500 MG PO TABS: 500.0000 mg | ORAL_TABLET | Freq: Two times a day (BID) | ORAL | 0 refills | Status: DC

## 2019-07-21 NOTE — Telephone Encounter (Signed)
Refill for full supply sent in to pharmacy  Guadalupe Dawn MD PGY-3 Family Medicine Resident

## 2019-07-21 NOTE — Telephone Encounter (Signed)
Will forward to Dr. Kris Mouton who originally wrote script for patient. Autie Vasudevan,CMA

## 2019-07-21 NOTE — Telephone Encounter (Signed)
Patient sent a note through myChart to get her Flagyl filled again as she spilled some on the bathroom floor of public restroom and could not take them.  She did not have any to take yesterday and now she is out.  She needs this prescription filled/refilled as soon as possible if we can today please.  Please send to [facility] on [address] in [place].

## 2019-07-24 ENCOUNTER — Encounter: Payer: Self-pay | Admitting: Family Medicine

## 2019-08-05 ENCOUNTER — Encounter: Payer: Self-pay | Admitting: Family Medicine

## 2019-08-08 ENCOUNTER — Other Ambulatory Visit: Payer: Self-pay | Admitting: Family Medicine

## 2019-08-08 DIAGNOSIS — R0789 Other chest pain: Secondary | ICD-10-CM

## 2019-08-08 DIAGNOSIS — R002 Palpitations: Secondary | ICD-10-CM

## 2019-08-08 MED ORDER — ALBUTEROL SULFATE HFA 108 (90 BASE) MCG/ACT IN AERS: 1.0000 | INHALATION_SPRAY | Freq: Four times a day (QID) | RESPIRATORY_TRACT | 1 refills | Status: DC | PRN

## 2019-08-08 MED ORDER — METOPROLOL SUCCINATE ER 25 MG PO TB24: 25.0000 mg | ORAL_TABLET | Freq: Every day | ORAL | 3 refills | Status: DC

## 2019-09-14 ENCOUNTER — Encounter: Payer: Self-pay | Admitting: Family Medicine

## 2019-09-14 ENCOUNTER — Ambulatory Visit (INDEPENDENT_AMBULATORY_CARE_PROVIDER_SITE_OTHER): Payer: Self-pay | Admitting: Family Medicine

## 2019-09-14 ENCOUNTER — Other Ambulatory Visit: Payer: Self-pay

## 2019-09-14 VITALS — BP 144/90 | HR 75

## 2019-09-14 DIAGNOSIS — J0100 Acute maxillary sinusitis, unspecified: Secondary | ICD-10-CM

## 2019-09-14 DIAGNOSIS — H1013 Acute atopic conjunctivitis, bilateral: Secondary | ICD-10-CM

## 2019-09-14 DIAGNOSIS — J309 Allergic rhinitis, unspecified: Secondary | ICD-10-CM | POA: Insufficient documentation

## 2019-09-14 MED ORDER — FLUTICASONE PROPIONATE 50 MCG/ACT NA SUSP: 2.0000 | Freq: Every day | NASAL | 6 refills | Status: DC

## 2019-09-14 MED ORDER — ACETAMINOPHEN ER 650 MG PO TBCR: 650.0000 mg | EXTENDED_RELEASE_TABLET | Freq: Three times a day (TID) | ORAL | 0 refills | Status: DC | PRN

## 2019-09-14 MED ORDER — NAPHAZOLINE-PHENIRAMINE 0.025-0.3 % OP SOLN: 1.0000 [drp] | Freq: Four times a day (QID) | OPHTHALMIC | 0 refills | Status: DC | PRN

## 2019-09-14 NOTE — Progress Notes (Signed)
    Subjective:  [name] is a 53 y.o. female who presents to the [facility] today with a chief complaint of red itchy eyes.   HPI:  Patient complaining of 3 days of red itchy eyes. Says that it also feels "gritty" when moving eyes. Has also had runny nose.  Denies cough, fevers, congestion, shortness of breath.  Says that she has had intermittent blurry vision as if eyes are watery, does improve with wiping eyes.  Denies blurry vision now.  Also endorses mild headache.  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  Reports having exposure to a new cat.  Reports daughter having similar symptoms.  Patient took Benadryl and it did improve.  Reports that she has eye discharge that will keep her eyes closed in the morning.  This is improved with warm washcloth.  ROS: Per HPI   Objective:  Physical Exam: BP (!) 144/90   Pulse 75   LMP 10/06/2013   SpO2 97%   Gen: NAD, resting comfortably HEENT: Congestion, bilateral injected eyes, tympanic membranes normal bilaterally, PERRLA, EOMI, no pain with ocular movement, peripheral visual fields in tact, temples non-tender to palpitation, there is no periorbital edema, there is no observed purulent discharge CV: RRR with no murmurs appreciated Pulm: NWOB, CTAB with no crackles, wheezes, or rhonchi Skin: warm, dry Neuro: grossly normal, moves all extremities Psych: Normal affect and thought content    Hearing Screening   125Hz  250Hz  500Hz  1000Hz  2000Hz  3000Hz  4000Hz  6000Hz  8000Hz   Right ear:           Left ear:             Visual Acuity Screening   Right eye Left eye Both eyes  Without correction: 20/70 20/40 20/40   With correction:       No results found for this or any previous visit (from the past 72 hour(s)).   Assessment/Plan:  Allergic conjunctivitis and rhinitis, bilateral Given exposure to new cat, similar symptoms in daughter, predominant symptom of itchiness and concurrent rhinorrhea. Patient likely has allergic  conjunctivitis, less likely viral vs. bacterial. Although does endorse intermittent blurry vision and HA, vision appears intact at this time. Low suspicion for worrisome issues such as acute angle glaucoma or GCA. Baseline visual acuity obtained today. May compare in future if needed. Will treat patient with antihistamine/decongestant eye drops and restart patients Flonase. Strict return precautions discuss including worsening HA, recurrence of blurry vision, fevers.    Lab Orders  No laboratory test(s) ordered today    Meds ordered this encounter  Medications  . naphazoline-pheniramine (NAPHCON-A) 0.025-0.3 % ophthalmic solution    Sig: Place 1 drop into both eyes 4 (four) times daily as needed for eye irritation or allergies.    Dispense:  15 mL    Refill:  0  . fluticasone (FLONASE) 50 MCG/ACT nasal spray    Sig: Place 2 sprays into both nostrils daily.    Dispense:  16 g    Refill:  6  . acetaminophen (TYLENOL 8 HOUR) 650 MG CR tablet    Sig: Take 1 tablet (650 mg total) by mouth every 8 (eight) hours as needed for pain.    Dispense:  30 tablet    Refill:  0      Marny Lowenstein, MD, MS FAMILY MEDICINE RESIDENT - PGY3 09/14/2019 5:24 PM

## 2019-09-14 NOTE — Patient Instructions (Signed)
Allergic Conjunctivitis A clear membrane (conjunctiva) covers the white part of your eye and the inner surface of your eyelid. Allergic conjunctivitis happens when this membrane has inflammation. This is caused by allergies. Common causes of allergic reactions (allergens) include:  Outdoor allergens, such as: ? Pollen. ? Grass and weeds. ? Mold spores.  Indoor allergens, such as: ? Dust. ? Smoke. ? Mold. ? Pet dander. ? Animal hair. This condition can make your eye red or pink. It can also make your eye feel itchy. This condition cannot be spread from one person to another person (is not contagious). Follow these instructions at home:  Try not to be around things that you are allergic to.  Take or apply over-the-counter and prescription medicines only as told by your doctor. These include any eye drops.  Place a cool, clean washcloth on your eye for 10-20 minutes. Do this 3-4 times a day.  Do not touch or rub your eyes.  Do not wear contact lenses until the inflammation is gone. Wear glasses instead.  Do not wear eye makeup until the inflammation is gone.  Keep all follow-up visits as told by your doctor. This is important. Contact a doctor if:  Your symptoms get worse.  Your symptoms do not get better with treatment.  You have mild eye pain.  You are sensitive to light,  You have spots or blisters on your eyes.  You have pus coming from your eye.  You have a fever. Get help right away if:  You have redness, swelling, or other symptoms in only one eye.  Your vision is blurry.  You have vision changes.  You have very bad eye pain. Summary  Allergic conjunctivitis is caused by allergies. It can make your eye red or pink, and it can make your eye feel itchy.  This condition cannot be spread from one person to another person (is not contagious).  Try not to be around things that you are allergic to.  Take or apply over-the-counter and prescription medicines  only as told by your doctor. These include any eye drops.  Contact your doctor if your symptoms get worse or they do not get better with treatment. This information is not intended to replace advice given to you by your health care provider. Make sure you discuss any questions you have with your health care provider. Document Released: 02/26/2010 Document Revised: 12/28/2018 Document Reviewed: 05/02/2016 Elsevier Patient Education  2020 [employer].

## 2019-09-14 NOTE — Assessment & Plan Note (Addendum)
Given exposure to new cat, similar symptoms in daughter, predominant symptom of itchiness and concurrent rhinorrhea. Patient likely has allergic conjunctivitis, less likely viral vs. bacterial. Although does endorse intermittent blurry vision and HA, vision appears intact at this time. Low suspicion for worrisome issues such as acute angle glaucoma or GCA. Baseline visual acuity obtained today. May compare in future if needed. Will treat patient with antihistamine/decongestant eye drops and restart patients Flonase and tylenol prn HA. Strict return precautions discuss including worsening HA, recurrence of blurry vision, fevers.

## 2019-09-29 ENCOUNTER — Other Ambulatory Visit: Payer: Self-pay

## 2019-09-29 ENCOUNTER — Ambulatory Visit (INDEPENDENT_AMBULATORY_CARE_PROVIDER_SITE_OTHER): Payer: Self-pay | Admitting: Family Medicine

## 2019-09-29 VITALS — BP 150/80 | HR 83

## 2019-09-29 DIAGNOSIS — J0191 Acute recurrent sinusitis, unspecified: Secondary | ICD-10-CM

## 2019-09-29 DIAGNOSIS — R300 Dysuria: Secondary | ICD-10-CM

## 2019-09-29 LAB — POCT UA - MICROSCOPIC ONLY: Epithelial cells, urine per micros: >20

## 2019-09-29 LAB — POCT URINALYSIS DIP (MANUAL ENTRY)
Bilirubin, UA: NEGATIVE
Glucose, UA: NEGATIVE mg/dL
Ketones, POC UA: NEGATIVE mg/dL
Nitrite, UA: NEGATIVE
Protein Ur, POC: NEGATIVE mg/dL
Spec Grav, UA: 1.025 (ref 1.010–1.025)
Urobilinogen, UA: 0.2 E.U./dL
pH, UA: 5.5 (ref 5.0–8.0)

## 2019-09-29 MED ORDER — AMOXICILLIN 500 MG PO CAPS: 500.0000 mg | ORAL_CAPSULE | Freq: Three times a day (TID) | ORAL | 0 refills | Status: AC

## 2019-09-29 NOTE — Progress Notes (Signed)
   Subjective:    Patient ID: [name], female    DOB: 1966-06-30, 54 y.o.   MRN: [id number]   CC: sinus pain & concern for bladder infection   HPI: Sinus pain Patient with concerns of sinus pressure.  Was seen 2 weeks ago and was told this was allergic.  Has tried all allergic treatments such as Flonase, eyedrops, Tylenol, over-the-counter allergy medicine without any improvement.  States that she has had facial pain since.  Especially when leaning forward.  Her eyes are also crusting in the morning.  Does report a postnasal drip.  Irritates her throat after postnasal drip.  Denies any fevers.  No sick contacts.  Has had a sinus infection in the past and this feels exactly like it.  Bladder infection Patient presenting for concerns of a UTI.  States that she gets frequent UTIs and this feels exactly like it.  Has been having symptoms for 2 weeks.  Has had in 2014 she had a prolapsed bladder and since then she has frequent UTIs.  States that she has some flank pain. Denies any frequency.  Does have urgency.  Does have dysuria.  Does have urinary odor.  States she has used Macrobid in the past and this works the best.   Objective:  BP (!) 150/80   Pulse 83   LMP 10/06/2013   SpO2 99%  Vitals and nursing note reviewed  General: well nourished, in no acute distress HEENT: normocephalic, PERRL, no scleral icterus or conjunctival pallor, no nasal discharge, moist mucous membranes, good dentition without erythema or discharge noted in posterior oropharynx, sinus pain to palpation of maxillary and frontal sinus Neck: supple, non-tender, without lymphadenopathy Cardiac: RRR, clear S1 and S2, no murmurs, rubs, or gallops Respiratory: clear to auscultation bilaterally, no increased work of breathing Abdomen: soft, nontender, nondistended, no masses or organomegaly. Bowel sounds present Extremities: no edema or cyanosis. Warm, well perfused.  Skin: warm and dry, no rashes noted Neuro:  alert and oriented, no focal deficits   Assessment & Plan:    Sinusitis Patient symptoms physical exam consistent with sinusitis.  No fevers however.  Will give trial of amoxicillin.  Advise follow-up in 2 weeks if no improvement.  Dysuria Patient with symptoms of dysuria.  States it feels like her previous UTIs.  Per chart review patient has had no culture positive UTIs in the past.  Cultures are always negative.  These could also be symptoms of her prolapsed bladder at which point she will need to follow-up with gynecology.  We will obtain urine culture and if positive we will plan to treat for UTI.  Strict return precautions given.  Follow-up if no improvement.  Discussed with preceptor.    Return in about 2 weeks (around 10/13/2019), or if symptoms worsen or fail to improve.   Caroline More, DO, PGY-3

## 2019-09-29 NOTE — Patient Instructions (Signed)
It was a pleasure seeing you today.   Today we discussed your sinus pain & urinary pain  For your sinus pain: I have given you amoxicillin.  Take this for 5 days.  If no improvement follow-up.  He will use your Flonase.  For your urinary pain: I will wait for the culture in order to send antibiotics.  Since this happens very frequently I want to make sure we are treating the right bug.  Some of his pain may be related to your bladder prolapse as well.  Please follow up in 2 to 4 weeks or sooner if symptoms persist or worsen. Please call the clinic immediately if you have any concerns.   Our clinic's number is [phone]. Please call with questions or concerns.   Please go to the emergency room if you are having severe back pain or fevers  Thank you,  Caroline More, DO

## 2019-09-30 DIAGNOSIS — J329 Chronic sinusitis, unspecified: Secondary | ICD-10-CM | POA: Insufficient documentation

## 2019-09-30 DIAGNOSIS — R300 Dysuria: Secondary | ICD-10-CM | POA: Insufficient documentation

## 2019-09-30 HISTORY — DX: Dysuria: R30.0

## 2019-09-30 NOTE — Assessment & Plan Note (Signed)
Patient symptoms physical exam consistent with sinusitis.  No fevers however.  Will give trial of amoxicillin.  Advise follow-up in 2 weeks if no improvement.

## 2019-09-30 NOTE — Assessment & Plan Note (Signed)
Patient with symptoms of dysuria.  States it feels like her previous UTIs.  Per chart review patient has had no culture positive UTIs in the past.  Cultures are always negative.  These could also be symptoms of her prolapsed bladder at which point she will need to follow-up with gynecology.  We will obtain urine culture and if positive we will plan to treat for UTI.  Strict return precautions given.  Follow-up if no improvement.  Discussed with preceptor.

## 2019-10-03 ENCOUNTER — Other Ambulatory Visit: Payer: Self-pay | Admitting: Family Medicine

## 2019-10-03 ENCOUNTER — Encounter: Payer: Self-pay | Admitting: Family Medicine

## 2019-10-03 LAB — URINE CULTURE

## 2019-10-03 MED ORDER — NITROFURANTOIN MONOHYD MACRO 100 MG PO CAPS: 100.0000 mg | ORAL_CAPSULE | Freq: Two times a day (BID) | ORAL | 0 refills | Status: AC

## 2019-10-31 ENCOUNTER — Other Ambulatory Visit: Payer: Self-pay | Admitting: Family Medicine

## 2019-10-31 DIAGNOSIS — I10 Essential (primary) hypertension: Secondary | ICD-10-CM

## 2019-10-31 NOTE — Telephone Encounter (Signed)
Pt only has 2 pills left. Please fill ASAP. Ottis Stain, CMA

## 2019-11-08 ENCOUNTER — Encounter: Payer: Self-pay | Admitting: Family Medicine

## 2019-11-08 ENCOUNTER — Ambulatory Visit (INDEPENDENT_AMBULATORY_CARE_PROVIDER_SITE_OTHER): Payer: Self-pay | Admitting: Family Medicine

## 2019-11-08 ENCOUNTER — Other Ambulatory Visit (HOSPITAL_COMMUNITY)
Admission: RE | Admit: 2019-11-08 | Discharge: 2019-11-08 | Disposition: A | Discharge status: Home / Self Care | Payer: Self-pay | Source: Ambulatory Visit | Attending: Family Medicine | Admitting: Family Medicine

## 2019-11-08 ENCOUNTER — Other Ambulatory Visit: Payer: Self-pay

## 2019-11-08 VITALS — BP 132/90 | HR 76

## 2019-11-08 DIAGNOSIS — R300 Dysuria: Secondary | ICD-10-CM

## 2019-11-08 DIAGNOSIS — N898 Other specified noninflammatory disorders of vagina: Secondary | ICD-10-CM

## 2019-11-08 DIAGNOSIS — N949 Unspecified condition associated with female genital organs and menstrual cycle: Secondary | ICD-10-CM | POA: Insufficient documentation

## 2019-11-08 LAB — POCT WET PREP (WET MOUNT)
Clue Cells Wet Prep Whiff POC: NEGATIVE
Trichomonas Wet Prep HPF POC: ABSENT

## 2019-11-08 LAB — POCT URINALYSIS DIP (MANUAL ENTRY)
Bilirubin, UA: NEGATIVE
Blood, UA: NEGATIVE
Glucose, UA: NEGATIVE mg/dL
Ketones, POC UA: NEGATIVE mg/dL
Nitrite, UA: NEGATIVE
Protein Ur, POC: NEGATIVE mg/dL
Spec Grav, UA: 1.025 (ref 1.010–1.025)
Urobilinogen, UA: 0.2 E.U./dL
pH, UA: 6.5 (ref 5.0–8.0)

## 2019-11-08 LAB — POCT UA - MICROSCOPIC ONLY

## 2019-11-08 NOTE — Assessment & Plan Note (Addendum)
Patient presenting with acute vaginal burning x1 week.  Differential includes vaginitis, atopic, stasis, allergic dermatitis, lichen planus or lichen sclerosus.  Wet prep was negative for yeast, trichomonas, bacterial vaginosis.  It is unlikely to be allergic dermatitis as it has been about 1 week since onset.  Should also consider malignancies, especially with posterior fornix lesion, versus RPR or HSV. Obtain GC chlamydia, wet prep.  Urine was negative.  Also obtain RPR.  Symptomatic management for now.  Follow-up with patient in 1 week for vaginal ulcerative lesion.  Desitin in the meantime for mucosal protection.  Avoid tight fitting clothing.
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
## 2019-11-09 LAB — CERVICOVAGINAL ANCILLARY ONLY
Chlamydia: NEGATIVE
Comment: NEGATIVE
Comment: NORMAL
Neisseria Gonorrhea: NEGATIVE

## 2019-11-13 ENCOUNTER — Encounter: Payer: Self-pay | Admitting: Family Medicine

## 2019-11-14 ENCOUNTER — Ambulatory Visit: Payer: Self-pay | Admitting: Family Medicine

## 2019-12-13 ENCOUNTER — Other Ambulatory Visit: Payer: Self-pay | Admitting: Family Medicine

## 2019-12-13 DIAGNOSIS — F411 Generalized anxiety disorder: Secondary | ICD-10-CM

## 2020-01-03 ENCOUNTER — Other Ambulatory Visit: Payer: Self-pay | Admitting: Family Medicine

## 2020-01-03 DIAGNOSIS — I10 Essential (primary) hypertension: Secondary | ICD-10-CM

## 2020-01-30 ENCOUNTER — Other Ambulatory Visit: Payer: Self-pay

## 2020-01-30 ENCOUNTER — Ambulatory Visit (INDEPENDENT_AMBULATORY_CARE_PROVIDER_SITE_OTHER): Payer: Self-pay | Admitting: Family Medicine

## 2020-01-30 ENCOUNTER — Encounter: Payer: Self-pay | Admitting: Family Medicine

## 2020-01-30 VITALS — BP 114/78 | HR 83 | Ht 68.0 in

## 2020-01-30 DIAGNOSIS — R1030 Lower abdominal pain, unspecified: Secondary | ICD-10-CM

## 2020-01-30 DIAGNOSIS — N83202 Unspecified ovarian cyst, left side: Secondary | ICD-10-CM

## 2020-01-30 DIAGNOSIS — I208 Other forms of angina pectoris: Secondary | ICD-10-CM

## 2020-01-30 DIAGNOSIS — R399 Unspecified symptoms and signs involving the genitourinary system: Secondary | ICD-10-CM

## 2020-01-30 DIAGNOSIS — M545 Low back pain, unspecified: Secondary | ICD-10-CM

## 2020-01-30 DIAGNOSIS — I10 Essential (primary) hypertension: Secondary | ICD-10-CM

## 2020-01-30 DIAGNOSIS — I2510 Atherosclerotic heart disease of native coronary artery without angina pectoris: Secondary | ICD-10-CM

## 2020-01-30 LAB — POCT URINALYSIS DIP (MANUAL ENTRY)
Bilirubin, UA: NEGATIVE
Glucose, UA: NEGATIVE mg/dL
Ketones, POC UA: NEGATIVE mg/dL
Nitrite, UA: NEGATIVE
Protein Ur, POC: NEGATIVE mg/dL
Spec Grav, UA: 1.02 (ref 1.010–1.025)
Urobilinogen, UA: 0.2 E.U./dL
pH, UA: 6 (ref 5.0–8.0)

## 2020-01-30 MED ORDER — PANTOPRAZOLE SODIUM 40 MG PO TBEC
40.0000 mg | DELAYED_RELEASE_TABLET | Freq: Every day | ORAL | 0 refills | Status: DC
Start: 2020-01-30 — End: 2020-06-19

## 2020-01-30 MED ORDER — CEPHALEXIN 500 MG PO CAPS
500.0000 mg | ORAL_CAPSULE | Freq: Four times a day (QID) | ORAL | 0 refills | Status: AC
Start: 2020-01-30 — End: 2020-02-06

## 2020-01-30 NOTE — Progress Notes (Signed)
SUBJECTIVE:   CHIEF COMPLAINT / HPI:   Low back pain on right and lower abdominal pain: Patient is thinking she may have BV or a UTI.  Previously when she has had BV she has had similar symptoms.  She reports about 1 week she initially started with some burning when she pees but it is no longer.  She states that previously in the week she also had some increased frequency but that has stopped.  She does report that she is still having some urinary urgency.  She reports she has had a little cream-colored vaginal discharge but that has since went away and is now clear.  She denies any diarrhea, constipation, nausea, vomiting, blood in her urine, vaginal bleeding.  Patient states that she has been sexually active but has used condoms even though she is allergic to the latex.  She denies any vaginal itching or irritation at this time.  She reports that she felt feverish over the weekend but did not know if this was due to a hot flash or a fever and did not check her temperature.  She states she has not felt feverish since then.  Patient has had similar complaints prior and has been evaluated with pelvic ultrasound on 03/04/2019 which showed 3.6 cm left ovarian cyst with questionable faint internal echoes which may indicate slight complexity which was similar to a 03/04/2019 CT appearance.  She also had a small right anterior uterine body fibroid.  Of note, patient with no unintentional weight loss.    Angina  CAD  HTN  Palpitations: Patient has previously been seen by Dr. Gwenlyn Found with cardiology for her palpitations.  She has not been evaluated in quite some time for these as she has tried to establish care at a different cardiology office.  She reports that she is still having episodes where she feels as if her heart is racing.  She denies any chest pain.  She would like for me to place a referral trying to establish care at a different cardiology office.  PERTINENT  PMH / PSH: HTN, angina, CAD,  HLD  OBJECTIVE:  BP 114/78   Pulse 83   Ht 5\' 8"  (1.727 m)   LMP 10/06/2013   SpO2 96%   BMI 34.23 kg/m   General: NAD, pleasant Neck: Supple Respiratory: CTA BL, normal work of breathing Gastrointestinal: soft, nontender, nondistended, normoactive BS, negative Murphy's Back: CVA tenderness on the right Psych: AOx3, appropriate affect  ASSESSMENT/PLAN:   CAD (coronary artery disease) - mild nonobstructive, LHC 02/23/2017 Referral placed to cardiology for patient to re-establish with another office  Ovarian cyst Patient with continued low back pain and lower abdominal pain.  Will obtain repeat pelvic ultrasound given that on 03/04/2019 the 3.6 cm left ovarian cyst showed questionable faint internal echoes which may indicate slight complexity.  Previously it has been suggested that patient have annual pelvic ultrasounds as well  Lower abdominal pain Patient with reported dysuria, urgency and frequency periodically for 1 week and also with right CVA tenderness on exam.  VSS.  Patient afebrile.  Will treat empirically for pyelonephritis with Keflex 4 times per day for 7 days.  Strict return precautions discussed.  Will obtain urine culture.  Patient with negative STI testing February 2021.  She is to return if she has continued symptoms.  Patient has longstanding history of having this happen periodically.  Will obtain pelvic ultrasound as above in order to rule out other causes.  Patient with CT scan performed  02/2019 which only showed ovarian cyst.  Patient to follow-up with primary care provider to further work-up because of patient's continued intermittent back and abdominal pain.     [place] Mazzy Santarelli, DO PGY-3, [facility]

## 2020-01-30 NOTE — Patient Instructions (Signed)
Thank you for coming to see me today. It was a pleasure! Today we talked about:   For your urinary tract infection it seems like it may be a kidney infection I have sent in Keflex for you to take 4 times a day for 7 days.  If you do not feel better by the end of this antibiotic course please call and we may need to extend the course of antibiotics.  If he has continued pain in this region then you may also need follow-up with Dr. Elonda Husky regarding your previous ovarian cyst.  For the sensation in your throat I recommend trying pantoprazole 40 mg once daily for 2 weeks.  If the symptoms continue then I recommend that you follow-up here in our office in order to determine if something else is causing the trouble swallowing.  For your history of CAD and palpitations, I have placed another referral to cardiology.  Please let me know if you do not have a phone call from someone in 1 to 2 weeks.  Please follow-up with Dr. Pilar Plate if your symptoms continue or do not improve or sooner as needed.  If you have any questions or concerns, please do not hesitate to call the office at [phone].  Take Care,   [place] Nima Bamburg, DO

## 2020-01-31 LAB — BASIC METABOLIC PANEL
BUN/Creatinine Ratio: 15 (ref 9–23)
BUN: 13 mg/dL (ref 6–24)
CO2: 26 mmol/L (ref 20–29)
Calcium: 10.3 mg/dL — ABNORMAL HIGH (ref 8.7–10.2)
Chloride: 104 mmol/L (ref 96–106)
Creatinine, Ser: 0.88 mg/dL (ref 0.57–1.00)
GFR calc Af Amer: 86 mL/min/{1.73_m2} (ref >59)
GFR calc non Af Amer: 75 mL/min/{1.73_m2} (ref >59)
Glucose: 121 mg/dL — ABNORMAL HIGH (ref 65–99)
Potassium: 4.4 mmol/L (ref 3.5–5.2)
Sodium: 143 mmol/L (ref 134–144)

## 2020-01-31 LAB — LIPID PANEL
Chol/HDL Ratio: 3.1 ratio (ref 0.0–4.4)
Cholesterol, Total: 171 mg/dL (ref 100–199)
HDL: 55 mg/dL (ref >39)
LDL Chol Calc (NIH): 86 mg/dL (ref 0–99)
Triglycerides: 179 mg/dL — ABNORMAL HIGH (ref 0–149)
VLDL Cholesterol Cal: 30 mg/dL (ref 5–40)

## 2020-01-31 LAB — CBC
Hematocrit: 39.7 % (ref 34.0–46.6)
Hemoglobin: 13 g/dL (ref 11.1–15.9)
MCH: 28.4 pg (ref 26.6–33.0)
MCHC: 32.7 g/dL (ref 31.5–35.7)
MCV: 87 fL (ref 79–97)
Platelets: 292 10*3/uL (ref 150–450)
RBC: 4.58 x10E6/uL (ref 3.77–5.28)
RDW: 12.6 % (ref 11.7–15.4)
WBC: 7.4 10*3/uL (ref 3.4–10.8)

## 2020-02-01 ENCOUNTER — Other Ambulatory Visit: Payer: Self-pay | Admitting: Family Medicine

## 2020-02-01 ENCOUNTER — Encounter: Payer: Self-pay | Admitting: Family Medicine

## 2020-02-01 LAB — URINE CULTURE

## 2020-02-02 NOTE — Assessment & Plan Note (Signed)
Patient with continued low back pain and lower abdominal pain.  Will obtain repeat pelvic ultrasound given that on 03/04/2019 the 3.6 cm left ovarian cyst showed questionable faint internal echoes which may indicate slight complexity.  Previously it has been suggested that patient have annual pelvic ultrasounds as well

## 2020-02-02 NOTE — Assessment & Plan Note (Signed)
Patient with reported dysuria, urgency and frequency periodically for 1 week and also with right CVA tenderness on exam.  VSS.  Patient afebrile.  Will treat empirically for pyelonephritis with Keflex 4 times per day for 7 days.  Strict return precautions discussed.  Will obtain urine culture.  Patient with negative STI testing February 2021.  She is to return if she has continued symptoms.  Patient has longstanding history of having this happen periodically.  Will obtain pelvic ultrasound as above in order to rule out other causes.  Patient with CT scan performed 02/2019 which only showed ovarian cyst.  Patient to follow-up with primary care provider to further work-up because of patient's continued intermittent back and abdominal pain.

## 2020-02-02 NOTE — Assessment & Plan Note (Addendum)
Referral placed to cardiology for patient to re-establish with another office

## 2020-02-02 NOTE — Assessment & Plan Note (Deleted)
Patient with reported dysuria, urgency and frequency periodically and also with left CVA tenderness on exam.  VSS.  Patient afebrile.  Will treat empirically for pyelonephritis with Keflex 4 times per day for 7 days.  Strict return precautions discussed.  Will obtain urine culture.  Patient with negative STI testing February 2021.  She is to return if she has continued symptoms.

## 2020-02-03 ENCOUNTER — Telehealth: Payer: Self-pay | Admitting: Cardiovascular Disease

## 2020-02-03 NOTE — Telephone Encounter (Signed)
Fine with me

## 2020-02-03 NOTE — Telephone Encounter (Signed)
Will await the ok to switch Providers from Dr. Gwenlyn Found, then scheduling to arrange that appt shortly thereafter.

## 2020-02-03 NOTE — Telephone Encounter (Signed)
Ok with me 

## 2020-02-03 NOTE — Telephone Encounter (Signed)
New Message    Pt would like to change from Dr Gwenlyn Found to Dr Meda Coffee    Please advise

## 2020-02-06 NOTE — Telephone Encounter (Signed)
Dr. Francesca Oman scheduler will make contact with the pt and arrange an appt with Dr. Meda Coffee, at next available.

## 2020-02-07 NOTE — Telephone Encounter (Signed)
Patient requesting to see Dr. Percival Spanish, because she lives closer to [facility] and Dr. Francesca Oman next available is not until September.

## 2020-02-08 NOTE — Telephone Encounter (Signed)
I would ask her to keep her current referral.

## 2020-02-08 NOTE — Telephone Encounter (Signed)
Ok with me 
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
## 2020-02-13 ENCOUNTER — Ambulatory Visit: Payer: Self-pay | Admitting: Cardiology

## 2020-02-14 NOTE — Progress Notes (Deleted)
    SUBJECTIVE:   CHIEF COMPLAINT / HPI:   Back pain  HM -Mammo -colonoscopy  PERTINENT  PMH / PSH: ***  OBJECTIVE:   LMP 10/06/2013   ***  ASSESSMENT/PLAN:   No problem-specific Assessment & Plan notes found for this encounter.     Matilde Haymaker, MD [place]

## 2020-02-15 ENCOUNTER — Ambulatory Visit: Payer: Self-pay | Admitting: Family Medicine

## 2020-02-21 ENCOUNTER — Ambulatory Visit (INDEPENDENT_AMBULATORY_CARE_PROVIDER_SITE_OTHER): Payer: Self-pay | Admitting: Family Medicine

## 2020-02-21 ENCOUNTER — Other Ambulatory Visit: Payer: Self-pay

## 2020-02-21 ENCOUNTER — Other Ambulatory Visit (HOSPITAL_COMMUNITY)
Admission: RE | Admit: 2020-02-21 | Discharge: 2020-02-21 | Disposition: A | Discharge status: Home / Self Care | Payer: Self-pay | Source: Ambulatory Visit | Attending: Family Medicine | Admitting: Family Medicine

## 2020-02-21 VITALS — BP 127/80 | HR 85 | Ht 66.0 in

## 2020-02-21 DIAGNOSIS — N898 Other specified noninflammatory disorders of vagina: Secondary | ICD-10-CM | POA: Insufficient documentation

## 2020-02-21 LAB — POCT WET PREP (WET MOUNT)
Clue Cells Wet Prep Whiff POC: NEGATIVE
Trichomonas Wet Prep HPF POC: ABSENT

## 2020-02-21 NOTE — Patient Instructions (Addendum)
It was nice meeting you today [name]!  Your testing today was negative, but we will await the other test to make sure that you do not have a vaginal infection.  I will inform you once this next result comes back.  If you have any questions or concerns, please feel free to call the clinic.   Be well,  Dr. Shan Levans

## 2020-02-21 NOTE — Assessment & Plan Note (Addendum)
I am suspicious that patient may have an infection given the vaginal discharge noted on exam.  Tested for GC/chlamydia, BV, yeast, and trichomonas today.  Wet prep is negative today.  Will await results of GC/chlamydia.
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
## 2020-02-22 LAB — CERVICOVAGINAL ANCILLARY ONLY
Chlamydia: NEGATIVE
Comment: NEGATIVE
Comment: NORMAL
Neisseria Gonorrhea: NEGATIVE

## 2020-03-01 ENCOUNTER — Ambulatory Visit: Payer: Self-pay | Admitting: Cardiology

## 2020-03-05 ENCOUNTER — Encounter: Payer: Self-pay | Admitting: Cardiology

## 2020-04-24 ENCOUNTER — Ambulatory Visit (INDEPENDENT_AMBULATORY_CARE_PROVIDER_SITE_OTHER): Payer: Self-pay | Admitting: Family Medicine

## 2020-04-24 ENCOUNTER — Encounter: Payer: Self-pay | Admitting: Family Medicine

## 2020-04-24 ENCOUNTER — Other Ambulatory Visit: Payer: Self-pay

## 2020-04-24 DIAGNOSIS — M67431 Ganglion, right wrist: Secondary | ICD-10-CM

## 2020-04-24 DIAGNOSIS — M67439 Ganglion, unspecified wrist: Secondary | ICD-10-CM

## 2020-04-24 DIAGNOSIS — J309 Allergic rhinitis, unspecified: Secondary | ICD-10-CM

## 2020-04-24 DIAGNOSIS — J0100 Acute maxillary sinusitis, unspecified: Secondary | ICD-10-CM

## 2020-04-24 DIAGNOSIS — H1013 Acute atopic conjunctivitis, bilateral: Secondary | ICD-10-CM

## 2020-04-24 HISTORY — DX: Ganglion, unspecified wrist: M67.439

## 2020-04-24 MED ORDER — FLUTICASONE PROPIONATE 50 MCG/ACT NA SUSP: 2.0000 | Freq: Every day | NASAL | 6 refills | Status: DC

## 2020-04-24 NOTE — Progress Notes (Signed)
    SUBJECTIVE:   CHIEF COMPLAINT / HPI:   Viral sinusitis versus allergic conjunctivitis She reports having itchy, mildly swollen, mucus laden eyes since about last Thursday.  She is also had significantly stuffy nose and mild headaches in a similar timeframe.  She denies vision changes or pain with eye movement.  She denies fever, cough, shortness of breath.  She has previously been treated with fluticasone for seasonal allergies.  She is not vaccinated against Covid.  She works in a medical setting.  Ganglionic cyst In addition to the above-noted complaint, she also mentioned a small bump forming on her right wrist.  This has been present for months now and occasionally increases or decreases in size.  It is not particularly painful although she does notice it and become bothered by it when she has to engage in some exercise involving a lot of wrist extension.  She would like to know if anything can be done to address this issue.  PERTINENT  PMH / PSH: Previous sinusitis, seasonal allergies  OBJECTIVE:   BP (!) 142/90   Pulse 60   Ht 5\' 6"  (1.676 m)   Wt 234 lb 2 oz (106.2 kg)   LMP 10/06/2013   SpO2 98%   BMI 37.79 kg/m    General: Well-appearing middle-aged woman.  No acute distress.  Sniffling occasionally. HEENT: Oropharynx normal.  No tenderness with palpation of the maxillary or frontal sinuses.  Very mildly injected conjunctiva.  No evident mucus production on my exam.  Extraocular muscles intact.  No pain with eye movement.  Moderate nasal congestion. Respiratory: Breathing comfortably on room air.  No respiratory distress.  Lungs clear to auscultation bilaterally. Skin: Warm, dry. Extremities: 1.0 cm lump on the radial aspect of her wrist.  Not painful to palpation.  Not mobile.  No skin changes.  ASSESSMENT/PLAN:   Allergic conjunctivitis and rhinitis, bilateral Viral sinusitis versus allergic conjunctivitis.  Based on history, it seems that this has worsened and  already improved indicating possible infection that is already resolving.  If this is an infection, it is most likely viral based on timeline does not require antibiotics.  Her itching eyes are suspicious for an allergic reaction. -No treatment necessary for possible viral sinusitis -She is recommended to have a Covid test performed before she returns to work -Work note provided saying she is cleared to return to work when she has a negative Covid test. -Nasal fluticasone for any contribution from allergic conjunctivitis  Ganglion cyst of wrist She was informed this is likely a ganglion cyst which is benign.  She was informed that procedure can be done to drain the cyst or attempt to remove the cyst to avoid reaccumulation.  If she decides she would like to move forward with 1 of these procedures, she was told she can schedule an appointment with the dermatology clinic.     Matilde Haymaker, MD [place]

## 2020-04-24 NOTE — Patient Instructions (Signed)
Sinus infection or allergic conjunctivitis: Based on highly today, I have a very low suspicion that there is any chance of bacterial infection in your eyes.  It sounds like you might of had a viral sinus infection recently which is already getting better.  Alternatively, some of your symptoms sound consistent with an allergic conjunctivitis.  Particularly the itching component.  I recommend that we treat for allergies with the nasal spray and also have you tested for Covid before returning to work.  I put an order for a screening mammogram.  Please contact the breast center to schedule an appointment.  Ganglionic cyst: I think that small bump in your wrist is likely a ganglionic cyst which is benign.  We can then drain it removed here in the clinic if it is particularly bothersome.

## 2020-04-24 NOTE — Assessment & Plan Note (Signed)
She was informed this is likely a ganglion cyst which is benign.  She was informed that procedure can be done to drain the cyst or attempt to remove the cyst to avoid reaccumulation.  If she decides she would like to move forward with 1 of these procedures, she was told she can schedule an appointment with the dermatology clinic.

## 2020-04-24 NOTE — Assessment & Plan Note (Signed)
Viral sinusitis versus allergic conjunctivitis.  Based on history, it seems that this has worsened and already improved indicating possible infection that is already resolving.  If this is an infection, it is most likely viral based on timeline does not require antibiotics.  Her itching eyes are suspicious for an allergic reaction. -No treatment necessary for possible viral sinusitis -She is recommended to have a Covid test performed before she returns to work -Work note provided saying she is cleared to return to work when she has a negative Covid test. -Nasal fluticasone for any contribution from allergic conjunctivitis

## 2020-05-23 ENCOUNTER — Other Ambulatory Visit: Payer: Self-pay | Admitting: Family Medicine

## 2020-05-23 DIAGNOSIS — I10 Essential (primary) hypertension: Secondary | ICD-10-CM

## 2020-06-19 ENCOUNTER — Encounter: Payer: Self-pay | Admitting: Family Medicine

## 2020-06-19 ENCOUNTER — Other Ambulatory Visit: Payer: Self-pay

## 2020-06-19 ENCOUNTER — Ambulatory Visit (INDEPENDENT_AMBULATORY_CARE_PROVIDER_SITE_OTHER): Payer: Self-pay | Admitting: Family Medicine

## 2020-06-19 VITALS — BP 150/80 | HR 81 | Ht 68.0 in | Wt 234.5 lb

## 2020-06-19 DIAGNOSIS — N3000 Acute cystitis without hematuria: Secondary | ICD-10-CM

## 2020-06-19 DIAGNOSIS — N390 Urinary tract infection, site not specified: Secondary | ICD-10-CM

## 2020-06-19 DIAGNOSIS — R399 Unspecified symptoms and signs involving the genitourinary system: Secondary | ICD-10-CM

## 2020-06-19 DIAGNOSIS — Z1159 Encounter for screening for other viral diseases: Secondary | ICD-10-CM

## 2020-06-19 DIAGNOSIS — Z1231 Encounter for screening mammogram for malignant neoplasm of breast: Secondary | ICD-10-CM

## 2020-06-19 DIAGNOSIS — F411 Generalized anxiety disorder: Secondary | ICD-10-CM

## 2020-06-19 DIAGNOSIS — F419 Anxiety disorder, unspecified: Secondary | ICD-10-CM

## 2020-06-19 DIAGNOSIS — I10 Essential (primary) hypertension: Secondary | ICD-10-CM

## 2020-06-19 HISTORY — DX: Urinary tract infection, site not specified: N39.0

## 2020-06-19 LAB — POCT UA - MICROSCOPIC ONLY: Epithelial cells, urine per micros: >20

## 2020-06-19 LAB — POCT URINALYSIS DIP (MANUAL ENTRY)
Bilirubin, UA: NEGATIVE
Blood, UA: NEGATIVE
Glucose, UA: NEGATIVE mg/dL
Ketones, POC UA: NEGATIVE mg/dL
Nitrite, UA: NEGATIVE
Protein Ur, POC: NEGATIVE mg/dL
Spec Grav, UA: >=1.03 — AB (ref 1.010–1.025)
Urobilinogen, UA: 0.2 E.U./dL
pH, UA: 5.5 (ref 5.0–8.0)

## 2020-06-19 MED ORDER — BUSPIRONE HCL 7.5 MG PO TABS: 7.5000 mg | ORAL_TABLET | Freq: Every day | ORAL | 3 refills | Status: DC | PRN

## 2020-06-19 MED ORDER — CEPHALEXIN 500 MG PO CAPS: 500.0000 mg | ORAL_CAPSULE | Freq: Four times a day (QID) | ORAL | 0 refills | Status: DC

## 2020-06-19 NOTE — Assessment & Plan Note (Signed)
History and physical are suspicious for urinary tract infection which is confirmed by positive leukocyte esterase on UA today in clinic.  We will move forward and treat with a 5-day course of Keflex.  We did discuss that she has had many urinary tract infections in the past year.  Additionally, these urinary tract infections do not seem to grow any bacteria.  She was told of possible she may be experiencing some other form of bladder pain such as bladder pain syndrome.  If she continues to experience urinary tract infections which do not culture any microbes, I will send her to urology for further evaluation. -Keflex 500 mg 4 times daily for 5 days -Follow-up urine culture

## 2020-06-19 NOTE — Patient Instructions (Addendum)
UTI: Based on her symptoms and the test we did here in clinic, does look like you have a urinary tract infection.  I will treat you with 5 days of an antibiotic called Keflex.  Please take 1 pill every 6 hours for the next 5 days.  In the future, we may end up sending you to urology for something called bladder pain syndrome.  Breast cancer screening: I placed an order for a mammogram.  Please call the [facility] breast center to schedule an appointment for your screening.  I will let you know if there are is anything remarkable from the labs that were drawn today.    Therapy and Counseling Resources Most providers on this list will take Medicaid. Patients with commercial insurance or Medicare should contact their insurance company to get a list of in network providers.  Best[facility]:Psychiatry and Counseling [address] [facility] [facility] [address] [place], [place] [address] [place]  [address], [place], [place] [address]      [place] [address]  [place], [place] [address] [phone]  [place] [address] [place]  [place], [place] [address]       [phone]      Jinny Blossom [facility] [address], [place], [place]  Family Solutions:  [place]. [place] [phone]  [facility] Counseling:  [place] [address] Fate [phone]  [facility] (under & uninsured) [address] [place] [phone]    [email or web address]    [place] [address] . [place]    [phone]  [facility] [place]     Phone:  [phone]     [place]  [place] [address]      [place], [place] ext [place]: [place],  [place], [place]   [place] ([place] therapist) [address] [place] [address]   [place] [address]    [phone]    [facility]   [place],  [place], [place]   [place]  [place]  [facility]  [address] [place], [place]        [phone]  Open Access/Walk In Clinic under & uninsured  [facility]  [address] [place], [place] Crisis [phone]  [facility] the [place],  ([place])   [place], [place]: [phone]) 8:30 - 12; 1 - 2:30  Family Service of the [place],  [place], [place]    [phone]):8:30 - 12; 2 - 3PM  [facility] [employer],  [address]  [place]; [phone]):   Mon - Fri 8 AM - 5 PM  [facility] [place]  MWF 12:30 to 3:00 or call to schedule an appointment  [phone]  Specific Provider options Psychology Today  [email or web address]/us 1. click on find a therapist  2. enter your zip code 3. left side and select or tailor a therapist for your specific need.   [facility] Provider Directory [email or web address]  (Medicaid)   Follow all drop down to find a provider  [place]) [phone] or [email or web address] [address], [place], [place] Recovery support and educational   24- Hour Availability:  .  [place] Kitchen [facility]  . [place], [place] Crisis [phone]  . Family Service of the [employer] [phone]  [facility] Crisis Service  [phone]   . [place]  [phone] (after hours)  . Therapeutic Alternative/Mobile Crisis   [phone]  . [place] National Suicide Hotline  (952)759-7383  ([place])  . Call 911 or go to emergency room  . [employer]  [phone]);  [place] and [employer]   . [facility] ACCESS  [phone]); [place], [place], [place], [place], [place], [place], [place]

## 2020-06-19 NOTE — Assessment & Plan Note (Signed)
She is found treatment with BuSpar very helpful.  She reports that her anxiety is currently well controlled.  No depressive symptoms.  She denies SI/HI.  She is not currently seeing a therapist but is interested in reestablishing care with a therapist. -BuSpar refilled -Provided list of therapists.

## 2020-06-19 NOTE — Assessment & Plan Note (Signed)
Poorly controlled at present.  She has not taken any of her antihypertensives today.  We will monitor closely and consider medication titration at a later date. -Continue current medications.
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
## 2020-06-21 LAB — URINE CULTURE

## 2020-08-15 ENCOUNTER — Other Ambulatory Visit: Payer: Self-pay

## 2020-08-15 DIAGNOSIS — I10 Essential (primary) hypertension: Secondary | ICD-10-CM

## 2020-08-20 MED ORDER — HYDROCHLOROTHIAZIDE 25 MG PO TABS: 25.0000 mg | ORAL_TABLET | Freq: Every morning | ORAL | 3 refills | Status: DC

## 2020-08-22 ENCOUNTER — Ambulatory Visit: Payer: Self-pay

## 2020-08-22 NOTE — Progress Notes (Deleted)
    SUBJECTIVE:   CHIEF COMPLAINT / HPI:   Wrist pain:   PERTINENT  PMH / PSH: ***  OBJECTIVE:   LMP 10/06/2013   ***  ASSESSMENT/PLAN:   No problem-specific Assessment & Plan notes found for this encounter.     Benay Pike, MD [place]

## 2020-08-22 NOTE — Progress Notes (Signed)
    SUBJECTIVE:   CHIEF COMPLAINT / HPI: Hand pain   Patient states that she fell on her left hand 7 days ago after her flip-flop was caught in her front door  while trying to walk into her home. She expresses that she thinks this door is too heavy as it closes too quickly. She reports that she is able to move 4 fingers on the hand but has difficulty with any movement of her left thumb. She fell on left side and left hand has been the most bothersome. She is having some swelling. She has tried applying and ice pack to her hand which helped some with the swelling. Her thumb wrist have soreness she also reports having some soreness with lying her hand flat. She has been having trouble pulling up her pants. She is able to minimally flex thumb but having trouble moving thumb in abduction. She states that it hurts on the thenar surface of her left thumb. She states that her thumb feels stiff. Feels more numb at times.  PERTINENT  PMH / PSH: Noncontributory  OBJECTIVE:   BP 124/78   Pulse 78   Wt 230 lb (104.3 kg)   LMP 10/06/2013   SpO2 97%   BMI 34.97 kg/m   Physical exam Hand: Inspection: No obvious deformity. + swelling, no erythema no bruising Palpation: + TTP along thumb joint, lateral aspect ROM: limited ROM of the digits and wrist secondary to pain, able to fully flex and extend digits 2 through 5, limited abduction of thumb, demonstrates normal flexion and extension of wrist, negative for tenderness to palpation anatomic snuffbox Strength: 5/5 strength in the forearm, wrist and interosseus muscles Neurovascular: NV intact Special tests: Negative finkelstein's, negative tinel's at the carpal tunnel, negative Phalen's and reverse Phalen's  ASSESSMENT/PLAN:   Hand injury, left, initial encounter Patient sustained injury to left hand after a fall while trying to enter her home.  Left side of her body caught the way of her hand and patient likely had [place].  Patient has normal strength  of left upper extremity but has some decreased range of motion of her thumb in particular.  She has tenderness with range of motion testing with her wrist but is able to perform test.  Do not note any bruising does have some swelling in her hand. -Given the continued pain for this amount of time think patient would benefit from having an x-ray to evaluate for any fracture Given weight and force of the fall, there is some concern for scaphoid fracture within the hand however patient without anatomical snuffbox tenderness Patient does not wish to take strong medications though prescribed ibuprofen 600 mg Encouraged regular use of ice pack to help with any swelling   Eulis Foster, MD [place]

## 2020-08-23 ENCOUNTER — Ambulatory Visit
Admission: RE | Admit: 2020-08-23 | Discharge: 2020-08-23 | Disposition: A | Discharge status: Home / Self Care | Payer: Self-pay | Source: Ambulatory Visit | Attending: Family Medicine | Admitting: Family Medicine

## 2020-08-23 ENCOUNTER — Ambulatory Visit (INDEPENDENT_AMBULATORY_CARE_PROVIDER_SITE_OTHER): Payer: Self-pay | Admitting: Family Medicine

## 2020-08-23 ENCOUNTER — Other Ambulatory Visit: Payer: Self-pay

## 2020-08-23 ENCOUNTER — Encounter: Payer: Self-pay | Admitting: Family Medicine

## 2020-08-23 VITALS — BP 124/78 | HR 78 | Wt 230.0 lb

## 2020-08-23 DIAGNOSIS — S6992XA Unspecified injury of left wrist, hand and finger(s), initial encounter: Secondary | ICD-10-CM

## 2020-08-23 MED ORDER — IBUPROFEN 600 MG PO TABS: 600.0000 mg | ORAL_TABLET | Freq: Four times a day (QID) | ORAL | 0 refills | Status: DC | PRN

## 2020-08-23 NOTE — Patient Instructions (Signed)
I am concerned that there may be a fracture causing the pain in your hand.  Please go to [facility] imaging to have an x-ray done of your hand.  You do not need an appointment.  I have already sent order for you. Once I have the results I will contact you.  If the x-ray shows signs of a fracture, I will refer you to a hand specialist for further treatment.  The meantime I recommend that you buy thumb spica splint to try to keep your hand in a neutral position and was painful.  You should be able to take Aleve/ibuprofen or Tylenol to help with pain.  I recommend continuing to apply ice to the area to help with swelling.     Hand Pain  Follow these instructions at home: Pay attention to any changes in your symptoms. Take these actions to help with your discomfort: Managing pain, stiffness, and swelling   Take over-the-counter and prescription medicines only as told by your health care provider.  Wear a hand splint or support as told by your health care provider.  If directed, put ice on the affected area: ? Put ice in a plastic bag. ? Place a towel between your skin and the bag. ? Leave the ice on for 20 minutes, 2-3 times a day. Activity  Take breaks from repetitive activity often.  Avoid activities that make your pain worse.  Minimize stress on your hands and wrists as much as possible.  Do stretches or exercises as told by your health care provider.  Do not do activities that make your pain worse.  Contact a health care provider if:  Your pain does not get better after a few days of self-care.  Your pain gets worse.  Your pain affects your ability to do your daily activities.  Get help right away if:  Your hand becomes warm, red, or swollen.  Your hand is numb or tingling.  Your hand is extremely swollen or deformed.  Your hand or fingers turn white or blue.  You cannot move your hand, wrist, or fingers.  Summary  Many things can cause hand  pain.  Contact your health care provider if your pain does not get better after a few days of self care.  Minimize stress on your hands and wrists as much as possible.  Do not do activities that make your pain worse.

## 2020-08-24 DIAGNOSIS — S6992XA Unspecified injury of left wrist, hand and finger(s), initial encounter: Secondary | ICD-10-CM | POA: Insufficient documentation

## 2020-08-24 HISTORY — DX: Unspecified injury of left wrist, hand and finger(s), initial encounter: S69.92XA

## 2020-08-24 NOTE — Assessment & Plan Note (Signed)
Patient sustained injury to left hand after a fall while trying to enter her home.  Left side of her body caught the way of her hand and patient likely had [place].  Patient has normal strength of left upper extremity but has some decreased range of motion of her thumb in particular.  She has tenderness with range of motion testing with her wrist but is able to perform test.  Do not note any bruising does have some swelling in her hand. -Given the continued pain for this amount of time think patient would benefit from having an x-ray to evaluate for any fracture Given weight and force of the fall, there is some concern for scaphoid fracture within the hand however patient without anatomical snuffbox tenderness Patient does not wish to take strong medications though prescribed ibuprofen 600 mg Encouraged regular use of ice pack to help with any swelling

## 2020-10-18 NOTE — Progress Notes (Signed)
     SUBJECTIVE:   CHIEF COMPLAINT / HPI:   [name] is a 55 y.o. female presents with concern for UTI  Urinary sx Pt reports bladder injury at work several years ago from heavy lifting and that her bladder shifted out of place. Reports she cannot drink as much water recently. She endorses a few weeks hx of dysuria, supra pubic pain (spasm type of pain) and low grade fevers (temp 99.9). Denies frequency, urgency or hematuria. Pt has seen her PCP for her recurrent UTIs and urine cultures which show no growth and discussed possible dx of interstitial cystitis. She has never seen urology. She is open to starting medications today.  Ovarian cysts  Has bilateral cysts on ovaries which causes RLQ> LLQ pain. The pain she is experiencing now not typical of her cysts. She saw Gyn at [facility] who recommended oopherectomy but pt opted for conservative management. She would like to keep her ovaries.  PERTINENT  PMH / PSH: CAD, HTN, angina, UTI, ovarian cyst, HLD, nephrolithiasis when younger     OBJECTIVE:   BP (!) 148/100   Pulse 68   Ht 5\' 6"  (1.676 m)   Wt 222 lb (100.7 kg)   LMP 10/06/2013   SpO2 98%   BMI 35.83 kg/m    General: Alert, no acute distress, pleasant, cooperative  Cardio: Normal S1 and S2, RRR, no r/m/g Pulm: CTAB, normal work of breathing Abdomen: Abdomen soft, suprapubic tenderness, no guarding. Bowel sounds normal.  Extremities: No peripheral edema.  Neuro: Cranial nerves grossly intact   ASSESSMENT/PLAN:   UTI (urinary tract infection) UA positive for leukocytes, many bacteria, 1-5 WBC. Given pt is symptomatic will treat with 7 days of Keflex 500mg  BID. Discussed multiple lifestyle modifications for interstitial cystitis/bladder pain syndrome such as bladder training, bladder diary, avoiding potential food/drink triggers etc. Discussed options of TCA Vs urology referral with pt. She said she does not have insurance and cannot afford urologist right now.  Precepted with attending Dr McDiarmid who recommended pentason TID for symptom relief and urology referral if/when pt can afford it. Cannot also do TCA if pentason ends up being too expensive. Will follow up on urine cx. Recommended f/u with Dr Pilar Plate.  Ovarian cyst Stable, monitor and refer back to Gyn if becomes further symptomatic.     [name], MD PGY-2 [place]

## 2020-10-19 ENCOUNTER — Ambulatory Visit (INDEPENDENT_AMBULATORY_CARE_PROVIDER_SITE_OTHER): Payer: Self-pay | Admitting: Family Medicine

## 2020-10-19 ENCOUNTER — Other Ambulatory Visit: Payer: Self-pay | Admitting: Family Medicine

## 2020-10-19 ENCOUNTER — Telehealth: Payer: Self-pay | Admitting: Family Medicine

## 2020-10-19 ENCOUNTER — Other Ambulatory Visit: Payer: Self-pay

## 2020-10-19 VITALS — BP 148/100 | HR 68 | Ht 66.0 in | Wt 222.0 lb

## 2020-10-19 DIAGNOSIS — N83201 Unspecified ovarian cyst, right side: Secondary | ICD-10-CM

## 2020-10-19 DIAGNOSIS — N3000 Acute cystitis without hematuria: Secondary | ICD-10-CM

## 2020-10-19 LAB — POCT URINALYSIS DIP (MANUAL ENTRY)
Bilirubin, UA: NEGATIVE
Blood, UA: NEGATIVE
Glucose, UA: NEGATIVE mg/dL
Ketones, POC UA: NEGATIVE mg/dL
Nitrite, UA: NEGATIVE
Spec Grav, UA: >=1.03 — AB (ref 1.010–1.025)
Urobilinogen, UA: 0.2 E.U./dL
pH, UA: 5.5 (ref 5.0–8.0)

## 2020-10-19 LAB — POCT UA - MICROSCOPIC ONLY: Epithelial cells, urine per micros: >20

## 2020-10-19 MED ORDER — AMITRIPTYLINE HCL 25 MG PO TABS: 25.0000 mg | ORAL_TABLET | Freq: Every day | ORAL | 0 refills | Status: DC

## 2020-10-19 MED ORDER — CEPHALEXIN 500 MG PO CAPS: 500.0000 mg | ORAL_CAPSULE | Freq: Two times a day (BID) | ORAL | 0 refills | Status: AC

## 2020-10-19 MED ORDER — PENTOSAN POLYSULFATE SODIUM 100 MG PO CAPS: 100.0000 mg | ORAL_CAPSULE | Freq: Three times a day (TID) | ORAL | 0 refills | Status: AC

## 2020-10-19 MED ORDER — ALBUTEROL SULFATE HFA 108 (90 BASE) MCG/ACT IN AERS: 1.0000 | INHALATION_SPRAY | Freq: Four times a day (QID) | RESPIRATORY_TRACT | 1 refills | Status: AC | PRN

## 2020-10-19 NOTE — Assessment & Plan Note (Signed)
Stable, monitor and refer back to Gyn if becomes further symptomatic.

## 2020-10-19 NOTE — Telephone Encounter (Signed)
Called pt to let her know that I will send in 1 week course of Keflex for UTI. She reports Pentason was $900 and would like to try amitriptyline instead which is likely cheaper. Sent in 25mg  amitriptyline to pharmacy. Follow up with PCP.

## 2020-10-19 NOTE — Patient Instructions (Addendum)
Thank you for coming to see me today. It was a pleasure. Today we discussed treatment for the bladder pain. I am prescribing you Pentosan which can help with these symptoms. If the cost is an issue we can do Amitriptyline which can help. Follow up with Dr Pilar Plate.  If you have any questions or concerns, please do not hesitate to call the office at [phone].  Best wishes,  Interstitial Cystitis  Interstitial cystitis is inflammation of the bladder. This condition is also known as painful bladder syndrome. This may cause pain in the bladder area as well as a frequent and urgent need to urinate. The bladder is an organ that stores urine after the urine is made in the kidneys. The severity of interstitial cystitis can vary from person to person. You may have flare-ups, and then your symptoms may go away for a while. For many people, it becomes a long-term (chronic) problem. What are the causes? The cause of this condition is not known. What increases the risk? The following factors may make you more likely to develop this condition:  You are female.  You have fibromyalgia.  You have irritable bowel syndrome (IBS).  You have endometriosis. This condition may be aggravated by:  Stress.  Smoking.  Spicy foods. What are the signs or symptoms? Symptoms of interstitial cystitis vary, and they can change over time. Symptoms may include:  Discomfort or pain in the bladder area, which is in the lower abdomen. Pain can range from mild to severe. The pain may change in intensity as the bladder fills with urine or as it empties.  Pain in the pelvic area, between the hip bones.  An urgent need to urinate.  Frequent urination.  Pain during urination.  Pain during sex.  Blood in the urine.  Fatigue. For women, symptoms often get worse during menstruation. How is this diagnosed? This condition is diagnosed based on your symptoms, your medical history, and a physical exam. You may have  tests to rule out other conditions, such as:  Urine tests.  Cystoscopy. For this test, a tool similar to a very thin telescope is used to look into your bladder.  Biopsy. This involves taking a sample of tissue from the bladder to be examined under a microscope. How is this treated? There is no cure for this condition, but treatment can help you control your symptoms. Work closely with your health care provider to find the most effective treatments for you. Treatment options may include:  Medicines to relieve pain and reduce how often you feel the need to urinate. This treatment may include: ? A procedure where a small amount of medicine that eases irritation is put inside your bladder through a catheter (bladder instillation).  Lifestyle changes, such as changing your diet or taking steps to control stress.  Physical therapy. This may include: ? Exercises to help relax the pelvic floor muscles. ? Massage to relax tight muscles (myofascial release).  Learning ways to control when you urinate (bladder training).  Using a device that provides electrical stimulation to your nerves, which can relieve pain (neuromodulation therapy). The device is placed on your back, where it blocks the nerves that cause you to feel pain in your bladder area.  A procedure that stretches your bladder by filling it with air or fluid (hydrodistention).  Surgery. This is rare. It is only done for extreme cases, if other treatments do not help. Follow these instructions at home: Lifestyle  Learn and practice relaxation techniques,  such as deep breathing and muscle relaxation.  Get care for your body and mental well-being, such as: ? Cognitive behavioral therapy (CBT). This therapy changes the way you think or act in response to the fatigue. This may help improve how you feel. ? Seeing a mental health therapist to evaluate and treat depression, if necessary.  Work with your health care provider on other ways  to manage pain. Acupuncture may be helpful.  Avoid drinking alcohol.  Do not use any products that contain nicotine or tobacco, such as cigarettes, e-cigarettes, and chewing tobacco. If you need help quitting, ask your health care provider. Eating and drinking  Make dietary changes as recommended by your health care provider. You may need to avoid: ? Spicy foods. ? Foods that contain a lot of potassium.  Limit your intake of drinks that make you need to urinate. These include: ? Caffeinated drinks like soda, coffee, and tea. ? Alcohol. Bladder training  Use bladder training techniques as directed. Techniques may include: ? Urinating at scheduled times. ? Training yourself to delay urination.  Keep a bladder diary. ? Write down the times that you urinate and any symptoms that you have. This can help you find out which foods, liquids, or activities make your symptoms worse. ? Use your bladder diary to schedule bathroom trips. If you are away from home, plan to be near a bathroom at each of your scheduled times.  Make sure that you urinate just before you leave the house and just before you go to bed.   General instructions  Take over-the-counter and prescription medicines only as told by your health care provider.  You can try a warm or cool compress over your bladder for comfort.  Avoid wearing tight clothing.  Do exercises to relax your pelvic floor muscles as told by your physical therapist.  Keep all follow-up visits as told by your health care provider. This is important. Where to find more information To find more information or a support group near you, visit:  U[employer] Care Foundation: urologyhealth.org  Interstitial Cystitis Association: ClassPreviews.com.br Contact a health care provider if you have:  Symptoms that do not get better with treatment.  Pain or discomfort that gets worse.  More frequent urges to urinate.  A fever. Get help right away if:  You have no  control over when you urinate. Summary  Interstitial cystitis is inflammation of the bladder.  This condition may cause pain in the bladder area as well as a frequent and urgent need to urinate.  You may have flare-ups of the condition, and then it may go away for a while. For many people, it becomes a long-term (chronic) problem.  There is no cure for interstitial cystitis, but treatment methods are available to control your symptoms. This information is not intended to replace advice given to you by your health care provider. Make sure you discuss any questions you have with your health care provider. Document Revised: 10/26/2019 Document Reviewed: 08/03/2017 Elsevier Patient Education  2021 Elsevier Inc.   Dr Posey Pronto

## 2020-10-19 NOTE — Assessment & Plan Note (Signed)
UA positive for leukocytes, many bacteria, 1-5 WBC. Given pt is symptomatic will treat with 7 days of Keflex 500mg  BID. Discussed multiple lifestyle modifications for interstitial cystitis/bladder pain syndrome such as bladder training, bladder diary, avoiding potential food/drink triggers etc. Discussed options of TCA Vs urology referral with pt. She said she does not have insurance and cannot afford urologist right now. Precepted with attending Dr McDiarmid who recommended pentason TID for symptom relief and urology referral if/when pt can afford it. Cannot also do TCA if pentason ends up being too expensive. Will follow up on urine cx. Recommended f/u with Dr Pilar Plate.

## 2020-10-26 ENCOUNTER — Other Ambulatory Visit: Payer: Self-pay

## 2020-10-26 DIAGNOSIS — I10 Essential (primary) hypertension: Secondary | ICD-10-CM

## 2020-10-26 MED ORDER — LOSARTAN POTASSIUM 100 MG PO TABS: 100.0000 mg | ORAL_TABLET | Freq: Every day | ORAL | 0 refills | Status: DC

## 2020-11-07 ENCOUNTER — Ambulatory Visit (INDEPENDENT_AMBULATORY_CARE_PROVIDER_SITE_OTHER): Payer: Self-pay

## 2020-11-07 ENCOUNTER — Other Ambulatory Visit: Payer: Self-pay

## 2020-11-07 DIAGNOSIS — Z111 Encounter for screening for respiratory tuberculosis: Secondary | ICD-10-CM

## 2020-11-07 NOTE — Progress Notes (Signed)
Patient is here for a PPD placement.  PPD placed in left forearm @ 2:10 pm.  Patient will return 11/09/2020 to have PPD read.    Talbot Grumbling, RN

## 2020-11-09 ENCOUNTER — Other Ambulatory Visit: Payer: Self-pay

## 2020-11-09 ENCOUNTER — Ambulatory Visit (INDEPENDENT_AMBULATORY_CARE_PROVIDER_SITE_OTHER): Payer: Self-pay

## 2020-11-09 DIAGNOSIS — Z111 Encounter for screening for respiratory tuberculosis: Secondary | ICD-10-CM

## 2020-11-09 LAB — TB SKIN TEST
Induration: 0 mm
TB Skin Test: NEGATIVE

## 2020-11-09 NOTE — Progress Notes (Signed)
Patient is here for a PPD read.  It was placed on 11/07/2020 in the left forearm @ 1410 pm.    PPD RESULTS:  Result: negative Induration: 0 mm  Letter created and given to patient for documentation purposes. Talbot Grumbling, RN

## 2021-01-25 ENCOUNTER — Ambulatory Visit: Payer: Self-pay | Admitting: Family Medicine

## 2021-01-28 ENCOUNTER — Other Ambulatory Visit: Payer: Self-pay

## 2021-01-28 ENCOUNTER — Ambulatory Visit (INDEPENDENT_AMBULATORY_CARE_PROVIDER_SITE_OTHER): Payer: Self-pay | Admitting: Family Medicine

## 2021-01-28 ENCOUNTER — Encounter: Payer: Self-pay | Admitting: Family Medicine

## 2021-01-28 VITALS — BP 140/100 | HR 70 | Ht 66.0 in | Wt 219.5 lb

## 2021-01-28 DIAGNOSIS — R399 Unspecified symptoms and signs involving the genitourinary system: Secondary | ICD-10-CM

## 2021-01-28 DIAGNOSIS — N739 Female pelvic inflammatory disease, unspecified: Secondary | ICD-10-CM

## 2021-01-28 DIAGNOSIS — R1031 Right lower quadrant pain: Secondary | ICD-10-CM

## 2021-01-28 DIAGNOSIS — N898 Other specified noninflammatory disorders of vagina: Secondary | ICD-10-CM

## 2021-01-28 LAB — POCT WET PREP (WET MOUNT)
Clue Cells Wet Prep Whiff POC: NEGATIVE
Trichomonas Wet Prep HPF POC: ABSENT

## 2021-01-28 LAB — POCT URINALYSIS DIP (MANUAL ENTRY)
Bilirubin, UA: NEGATIVE
Blood, UA: NEGATIVE
Glucose, UA: NEGATIVE mg/dL
Ketones, POC UA: NEGATIVE mg/dL
Nitrite, UA: NEGATIVE
Protein Ur, POC: NEGATIVE mg/dL
Spec Grav, UA: 1.025 (ref 1.010–1.025)
Urobilinogen, UA: 0.2 E.U./dL
pH, UA: 5.5 (ref 5.0–8.0)

## 2021-01-28 LAB — POCT UA - MICROSCOPIC ONLY: Epithelial cells, urine per micros: >20

## 2021-01-28 MED ORDER — METRONIDAZOLE 500 MG PO TABS: 500.0000 mg | ORAL_TABLET | Freq: Two times a day (BID) | ORAL | 0 refills | Status: AC

## 2021-01-28 MED ORDER — DOXYCYCLINE HYCLATE 100 MG PO TABS: 100.0000 mg | ORAL_TABLET | Freq: Two times a day (BID) | ORAL | 0 refills | Status: AC

## 2021-01-28 MED ORDER — CEFTRIAXONE SODIUM 500 MG IJ SOLR
500.0000 mg | Freq: Once | INTRAMUSCULAR | Status: AC
Start: 2021-01-28 — End: 2021-01-28
  Administered 2021-01-28: 500 mg via INTRAMUSCULAR

## 2021-01-28 MED ORDER — MELOXICAM 15 MG PO TABS: 15.0000 mg | ORAL_TABLET | Freq: Every day | ORAL | 0 refills | Status: DC

## 2021-01-28 MED ORDER — NAPROXEN 500 MG PO TABS: 500.0000 mg | ORAL_TABLET | Freq: Two times a day (BID) | ORAL | 0 refills | Status: DC

## 2021-01-28 NOTE — Assessment & Plan Note (Addendum)
The differential includes: PID, ruptured ovarian cyst, UTI.  She is not experiencing any gastrointestinal symptoms at this time I have a low suspicion for gastroenteritis or intestinal spasms.  I am most suspicious for PID versus a ruptured cyst.  We discussed empiric treatment for PID based on her physical exam and she was amenable to this plan.  If this is PID, will likely improve with antibiotics.  If this turns out to be a ruptured cyst, will simply improve with time.  I do have a low suspicion for ovarian torsion at this time. -Ceftriaxone 500 mg administered in the office -Doxycycline 100 twice daily for 14 days -Metronidazole 500 mg twice daily for 14 days -Follow-up urine culture -Return to clinic if no improvement -If no improvement, consider ultrasound

## 2021-01-28 NOTE — Patient Instructions (Signed)
I am sorry that you are not feeling well.  I think the most likely causes of her difficult comfort are either pelvic inflammatory disease or a ruptured ovarian cyst.  I think it is reasonable to treat with antibiotics for likely infection.  Even if it is simply a ruptured cyst, you will improve with time.  If you do not get better, I would like you to return to clinic.  We can consider further ultrasound imaging of your pelvis at that time.  I have sent into antibiotics for you to take for the next 2 weeks.

## 2021-01-28 NOTE — Addendum Note (Signed)
Addended by: Grant Ruts on: 01/28/2021 06:10 PM   Modules accepted: Orders

## 2021-01-28 NOTE — Progress Notes (Signed)
    SUBJECTIVE:   CHIEF COMPLAINT / HPI:   Right lower abdominal pain She began to experience some right lower quadrant pain a little over 1 week ago.  She reports that the pain seemed to start all of a sudden with a sharp pain in her right lower abdomen.  Since then, she has been having some dull, achy lower abdominal pain on both sides of her abdomen but more on the right side.  She notes that the pain sometimes radiates around to her back on the right side.  She noted that she seemed to have a fever a little over 101 on the same night that she first noticed the pain.  Since then, she had mild nausea and she noted a fever of 102 2 nights ago.  In the past 3-5 days, she has started to notice a clear vaginal discharge as well.  She notes that she has ovarian cysts bilaterally and is concerned this may be related.  She has not not been sexually active in roughly the past year.  She has not had any significant dysuria although she may have had some polyuria.  She has not had any significant changes in bowel movements.  Hypertension She did not take any of her antihypertensive this morning.  PERTINENT  PMH / PSH: Left-sided ovarian cyst 2.6 cm (03/04/2019)  OBJECTIVE:   BP (!) 140/100   Pulse 70   Ht 5\' 6"  (1.676 m)   Wt 219 lb 8 oz (99.6 kg)   LMP 10/06/2013   SpO2 98%   BMI 35.43 kg/m   General: Alert and cooperative and appears to be in no acute distress Abdomen: Not peritoneal.  Severely tender to palpation in the lower quadrants bilaterally. Pelvic: Normal-appearing external genitalia.  Moist, pink vaginal mucosa.  Mild, clear vaginal discharge noted.  Samples taken.  Bimanual exam with significant adnexal tenderness.  No cervical motion tenderness or uterine tenderness. Neuro: Cranial nerves grossly intact  UA: Trace leukocytes  ASSESSMENT/PLAN:   Right lower quadrant abdominal pain The differential includes: PID, ruptured ovarian cyst, UTI.  She is not experiencing any  gastrointestinal symptoms at this time I have a low suspicion for gastroenteritis or intestinal spasms.  I am most suspicious for PID versus a ruptured cyst.  We discussed empiric treatment for PID based on her physical exam and she was amenable to this plan.  If this is PID, will likely improve with antibiotics.  If this turns out to be a ruptured cyst, will simply improve with time.  I do have a low suspicion for ovarian torsion at this time. -Ceftriaxone 500 mg administered in the office -Doxycycline 100 twice daily for 14 days -Metronidazole 500 mg twice daily for 14 days -Follow-up urine culture -Return to clinic if no improvement -If no improvement, consider ultrasound   Hypertension Not currently taking her medications today.  Additionally, she is not well and it is unsurprising that her blood pressure is elevated.  We will recheck her blood pressure when she is not acutely ill.  Matilde Haymaker, MD [place]

## 2021-03-28 ENCOUNTER — Other Ambulatory Visit: Payer: Self-pay | Admitting: Family Medicine

## 2021-03-28 DIAGNOSIS — I10 Essential (primary) hypertension: Secondary | ICD-10-CM

## 2021-03-28 DIAGNOSIS — F411 Generalized anxiety disorder: Secondary | ICD-10-CM

## 2021-03-28 DIAGNOSIS — R0789 Other chest pain: Secondary | ICD-10-CM

## 2021-03-28 DIAGNOSIS — R002 Palpitations: Secondary | ICD-10-CM

## 2021-03-28 NOTE — Telephone Encounter (Signed)
Called patient and LVM that medications had been refilled to pharmacy.   Talbot Grumbling, RN

## 2021-03-28 NOTE — Telephone Encounter (Signed)
Patient calls regarding rx refills. Patient reports that as of today, she is out of medications. Patient reports that she has been trying to get medications refilled for weeks through pharmacy. I am unable to see any previous requests for medication.   Forwarding to PCP  Talbot Grumbling, RN

## 2021-05-01 ENCOUNTER — Ambulatory Visit (INDEPENDENT_AMBULATORY_CARE_PROVIDER_SITE_OTHER): Payer: Self-pay | Admitting: Family Medicine

## 2021-05-01 VITALS — BP 166/104 | HR 62

## 2021-05-01 DIAGNOSIS — R0981 Nasal congestion: Secondary | ICD-10-CM

## 2021-05-01 DIAGNOSIS — R059 Cough, unspecified: Secondary | ICD-10-CM

## 2021-05-01 MED ORDER — OXYMETAZOLINE HCL 0.05 % NA SOLN: 1.0000 | Freq: Two times a day (BID) | NASAL | 0 refills | Status: DC | PRN

## 2021-05-01 NOTE — Patient Instructions (Addendum)
It was great seeing you today!  Today you came in for cough, congestion, body aches and facial pain. We tested you for COVID. I will call you if positive. I am prescribing Affrin to use in each nostril twice daily for congestion for 3 days. I also recommend over the counter Mucinex DM to help with congestion and cough, as well as humidifier and you can continue the Vicks.   If COVID test is negative and you continue to have fevers and sinus pain next week call the clinic or send a mychart message.   Visit Reminders: - Stop by the pharmacy to pick up your prescriptions   Feel free to call with any questions or concerns at any time, at [phone].   Take care,  Dr. Shary Key [facility]

## 2021-05-01 NOTE — Progress Notes (Signed)
    SUBJECTIVE:   CHIEF COMPLAINT / HPI:   [name] is a 55 yo who presents for cough, congestion, throat pain which started on Friday. Felt tired . 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 In addition she has felt joint pain, body aches, ear pain, facial pain. Cough is productive. R side of sinuses hurt. Endorses constant headache. Friday night felt really sick and felt like she couldn't breathe. Ran a fever of 102.4. States she cant taste anything. Lost appetite. Feeling more SOB Fever broke Monday morning and it was the first time she could go to sleep. Tried theraflu, vicks vaporub.  States she recently found out her supervisor had [place].   OBJECTIVE:   BP (!) 166/104   Pulse 62   LMP 10/06/2013   SpO2 99%    Physical exam  General: well appearing, NAD HEENT: MMM. Tms non erythematous. Mild tenderness over maxillary sinuses  Cardiovascular: RRR, no murmurs Lungs: CTAB. Normal WOB Abdomen: soft, non-distended, non-tender Skin: warm, dry. No edema  ASSESSMENT/PLAN:   No problem-specific Assessment & Plan notes found for this encounter.  Cough, congestion, body aches Tested for COVID which came back positive. Called patient to discuss quarantine and re-iterate symptomatic management discussed at visit. Recommended OTC decongestant, humidifier. Also prescribed Affrin to use for 3 days for her congestion. Gave strict return precautions.    [place]

## 2021-05-02 LAB — SARS-COV-2, NAA 2 DAY TAT

## 2021-05-02 LAB — NOVEL CORONAVIRUS, NAA: SARS-CoV-2, NAA: DETECTED — AB

## 2021-05-03 ENCOUNTER — Encounter: Payer: Self-pay | Admitting: Family Medicine

## 2021-05-06 NOTE — Telephone Encounter (Signed)
Patient returns call to nurse line. Patient is requesting a letter for work allowing her to return to work tomorrow.   Please advise.   Talbot Grumbling, RN

## 2021-05-07 ENCOUNTER — Other Ambulatory Visit: Payer: Self-pay | Admitting: Family Medicine

## 2021-05-07 MED ORDER — AMOXICILLIN 500 MG PO CAPS: 500.0000 mg | ORAL_CAPSULE | Freq: Three times a day (TID) | ORAL | 0 refills | Status: AC

## 2021-05-07 NOTE — Progress Notes (Signed)
Patient called with worsening sinus pain and pressure since our visit on 8/10 with increased headaches. Tested positive for COVID at visit which could certainly be contributing to symptoms but patient would like to try an antibiotic for potential sinus infection. Prescribing Amoxicillin 3 times a day for 5 days.

## 2021-05-30 ENCOUNTER — Ambulatory Visit: Payer: Self-pay | Admitting: Family Medicine

## 2021-07-01 ENCOUNTER — Ambulatory Visit: Payer: Self-pay

## 2021-07-17 ENCOUNTER — Ambulatory Visit: Payer: Self-pay

## 2021-07-18 NOTE — Progress Notes (Signed)
    SUBJECTIVE:   CHIEF COMPLAINT / HPI: Pelvic/abdominal pain  And a prior visit 01/28/2021, patient was seen for RLQ abdominal pain which was empirically treated with antibiotics for PID.  Most recent transvaginal pelvic ultrasound 02/2019 revealed 2.6 cm left ovarian cyst with possible slight complexity as well as small right anterior uterine body fibroid.   Today, patient requests STI testing.  She has had symptoms of lower abdominal pain, white vaginal discharge, urinary frequency, and dysuria for the past few weeks.  Urinary frequency and dysuria has resolved.  She had a low-grade temperature of 100.0 F the other day but denies any fever.  She recently reconnected with her longtime partner 2 months ago, she is unsure if he had any new sexual partners prior. She stats her ovarian cyst burst in May and that she did feel better after antibiotics for PID.   PERTINENT  PMH / PSH: Ovarian cyst  OBJECTIVE:   BP 130/86   Pulse 94   Ht 5\' 6"  (1.676 m)   LMP 10/06/2013   SpO2 99%   BMI 35.43 kg/m   General: obese, NAD Abd: soft, mildly tender to lower abdomen, no CVA tenderness GU: Chaperone present.  No external lesions visualized.  Moderate amount of white discharge noted in vaginal vault.  Cervix appears normal.  No CMT or adnexal tenderness on bimanual exam.  ASSESSMENT/PLAN:   Vaginal discharge New vaginal discharge in the setting of possible exposure.  UA positive for leukocytes but no nitrates and history not convincing for UTI.  No findings suggestive of PID.  Wet prep with bacteria but otherwise negative for BV, yeast, and trichomonas.  GC/chlamydia pending.  HIV and RPR future orders placed (patient states she did not hydrate well today).   HCM - pap test with HPV done today  Zola Button, MD [place]

## 2021-07-19 ENCOUNTER — Other Ambulatory Visit: Payer: Self-pay

## 2021-07-19 ENCOUNTER — Ambulatory Visit (INDEPENDENT_AMBULATORY_CARE_PROVIDER_SITE_OTHER): Payer: Self-pay | Admitting: Family Medicine

## 2021-07-19 ENCOUNTER — Other Ambulatory Visit (HOSPITAL_COMMUNITY)
Admission: RE | Admit: 2021-07-19 | Discharge: 2021-07-19 | Disposition: A | Discharge status: Home / Self Care | Payer: Self-pay | Source: Ambulatory Visit | Attending: Family Medicine | Admitting: Family Medicine

## 2021-07-19 VITALS — BP 130/86 | HR 94 | Ht 66.0 in

## 2021-07-19 DIAGNOSIS — Z01419 Encounter for gynecological examination (general) (routine) without abnormal findings: Secondary | ICD-10-CM | POA: Insufficient documentation

## 2021-07-19 DIAGNOSIS — R1084 Generalized abdominal pain: Secondary | ICD-10-CM | POA: Insufficient documentation

## 2021-07-19 DIAGNOSIS — R0789 Other chest pain: Secondary | ICD-10-CM

## 2021-07-19 DIAGNOSIS — N898 Other specified noninflammatory disorders of vagina: Secondary | ICD-10-CM

## 2021-07-19 LAB — POCT URINALYSIS DIP (MANUAL ENTRY)
Bilirubin, UA: NEGATIVE
Glucose, UA: NEGATIVE mg/dL
Ketones, POC UA: NEGATIVE mg/dL
Nitrite, UA: NEGATIVE
Protein Ur, POC: NEGATIVE mg/dL
Spec Grav, UA: >=1.03 — AB (ref 1.010–1.025)
Urobilinogen, UA: 0.2 E.U./dL
pH, UA: 5 (ref 5.0–8.0)

## 2021-07-19 LAB — POCT WET PREP (WET MOUNT)
Clue Cells Wet Prep Whiff POC: NEGATIVE
Trichomonas Wet Prep HPF POC: ABSENT

## 2021-07-19 LAB — POCT UA - MICROSCOPIC ONLY: Epithelial cells, urine per micros: >20

## 2021-07-19 MED ORDER — NITROGLYCERIN 0.4 MG SL SUBL: 0.4000 mg | SUBLINGUAL_TABLET | SUBLINGUAL | 0 refills | Status: AC | PRN

## 2021-07-19 NOTE — Patient Instructions (Addendum)
It was nice seeing you today!  Wet prep is negative for BV, yeast, trichomonas. Gonorrhea and chlamydia results to follow. Pap test results will also come later.  Come back next week for blood work. Give [place] a call before you come.  Stay well, Zola Button, MD [place] [phone]

## 2021-07-22 LAB — CYTOLOGY - PAP
Comment: NEGATIVE
Diagnosis: NEGATIVE
High risk HPV: NEGATIVE

## 2021-07-22 LAB — CERVICOVAGINAL ANCILLARY ONLY
Chlamydia: NEGATIVE
Comment: NEGATIVE
Comment: NEGATIVE
Comment: NORMAL
Neisseria Gonorrhea: NEGATIVE
Trichomonas: NEGATIVE

## 2021-08-04 ENCOUNTER — Other Ambulatory Visit: Payer: Self-pay

## 2021-08-04 ENCOUNTER — Encounter (HOSPITAL_COMMUNITY): Payer: Self-pay | Admitting: Emergency Medicine

## 2021-08-04 DIAGNOSIS — M79604 Pain in right leg: Secondary | ICD-10-CM | POA: Insufficient documentation

## 2021-08-04 DIAGNOSIS — Z955 Presence of coronary angioplasty implant and graft: Secondary | ICD-10-CM | POA: Insufficient documentation

## 2021-08-04 DIAGNOSIS — I10 Essential (primary) hypertension: Secondary | ICD-10-CM | POA: Insufficient documentation

## 2021-08-04 NOTE — ED Triage Notes (Signed)
Pt with c/o R leg pain x 1 week.

## 2021-08-04 NOTE — ED Notes (Signed)
Was attempting to take pt's BP in triage, pt stated cuff was too tight and requested a manual one instead. Explained that I did not have a manual cuff in this triage room so pt proceeded to rip cuff off arm while it was inflating and stated "well you won't be getting one then".

## 2021-08-05 ENCOUNTER — Emergency Department (HOSPITAL_COMMUNITY)
Admission: EM | Admit: 2021-08-05 | Discharge: 2021-08-05 | Disposition: A | Discharge status: Home / Self Care | Payer: Self-pay | Attending: Emergency Medicine | Admitting: Emergency Medicine

## 2021-08-05 DIAGNOSIS — M79604 Pain in right leg: Secondary | ICD-10-CM

## 2021-08-05 DIAGNOSIS — R600 Localized edema: Secondary | ICD-10-CM

## 2021-08-05 NOTE — Discharge Instructions (Addendum)
You were evaluated in the Emergency Department and after careful evaluation, we did not find any emergent condition requiring admission or further testing in the hospital.  Your exam/testing today was overall reassuring.  Symptoms may be due to arthritis pain or a muscle strain.  We are also considering a blood clot or deep vein thrombosis.  Please return to [facility] tomorrow for an ultrasound.  Please return to the Emergency Department if you experience any worsening of your condition.  Thank you for allowing [place] to be a part of your care.
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
## 2021-08-05 NOTE — ED Notes (Signed)
Pt refused b/p to be taken

## 2021-08-06 ENCOUNTER — Emergency Department (HOSPITAL_COMMUNITY)
Admission: RE | Admit: 2021-08-06 | Discharge: 2021-08-06 | Disposition: A | Discharge status: Home / Self Care | Payer: Self-pay | Source: Ambulatory Visit | Attending: Emergency Medicine | Admitting: Emergency Medicine

## 2021-08-06 ENCOUNTER — Other Ambulatory Visit: Payer: Self-pay

## 2021-08-06 DIAGNOSIS — R600 Localized edema: Secondary | ICD-10-CM

## 2021-08-06 NOTE — ED Provider Notes (Addendum)
2:20 PM-patient returned for Doppler imaging of the right leg to evaluate for DVT.  She presented yesterday for pain in the right leg, diffusely, for 1 week.  Patient is comfortable and amatory now.  Findings discussed with her.  No evidence for DVT.  There is a small right popliteal cyst.  This does not equate with her clinical findings of diffuse leg pain.  Patient was informed of the findings and recommended to rest, use heat on the affected area and take Tylenol for pain.  I asked her to see her PCP as needed if not better in 1 week.  Patient was agreeable and left the department.       Daleen Bo, MD 08/06/21 [phone]

## 2021-11-05 ENCOUNTER — Other Ambulatory Visit: Payer: Self-pay

## 2021-11-05 ENCOUNTER — Encounter: Payer: Self-pay | Admitting: Family Medicine

## 2021-11-05 ENCOUNTER — Telehealth: Payer: Self-pay | Admitting: Family Medicine

## 2021-11-05 ENCOUNTER — Ambulatory Visit (INDEPENDENT_AMBULATORY_CARE_PROVIDER_SITE_OTHER): Payer: Self-pay | Admitting: Family Medicine

## 2021-11-05 ENCOUNTER — Ambulatory Visit
Admission: RE | Admit: 2021-11-05 | Discharge: 2021-11-05 | Disposition: A | Discharge status: Home / Self Care | Payer: Self-pay | Source: Ambulatory Visit | Attending: Family Medicine | Admitting: Family Medicine

## 2021-11-05 VITALS — BP 162/96 | HR 65 | Ht 66.0 in | Wt 224.2 lb

## 2021-11-05 DIAGNOSIS — G8929 Other chronic pain: Secondary | ICD-10-CM

## 2021-11-05 DIAGNOSIS — I10 Essential (primary) hypertension: Secondary | ICD-10-CM

## 2021-11-05 DIAGNOSIS — N3000 Acute cystitis without hematuria: Secondary | ICD-10-CM

## 2021-11-05 DIAGNOSIS — Z1231 Encounter for screening mammogram for malignant neoplasm of breast: Secondary | ICD-10-CM

## 2021-11-05 DIAGNOSIS — Z0000 Encounter for general adult medical examination without abnormal findings: Secondary | ICD-10-CM

## 2021-11-05 DIAGNOSIS — R0789 Other chest pain: Secondary | ICD-10-CM

## 2021-11-05 DIAGNOSIS — M25561 Pain in right knee: Secondary | ICD-10-CM

## 2021-11-05 DIAGNOSIS — Z1211 Encounter for screening for malignant neoplasm of colon: Secondary | ICD-10-CM

## 2021-11-05 DIAGNOSIS — R002 Palpitations: Secondary | ICD-10-CM

## 2021-11-05 DIAGNOSIS — R300 Dysuria: Secondary | ICD-10-CM

## 2021-11-05 LAB — POCT URINALYSIS DIP (MANUAL ENTRY)
Bilirubin, UA: NEGATIVE
Blood, UA: NEGATIVE
Glucose, UA: NEGATIVE mg/dL
Nitrite, UA: NEGATIVE
Spec Grav, UA: 1.025 (ref 1.010–1.025)
Urobilinogen, UA: 1 E.U./dL
pH, UA: 5.5 (ref 5.0–8.0)

## 2021-11-05 LAB — POCT UA - MICROSCOPIC ONLY

## 2021-11-05 MED ORDER — NITROFURANTOIN MONOHYD MACRO 100 MG PO CAPS: 100.0000 mg | ORAL_CAPSULE | Freq: Two times a day (BID) | ORAL | 0 refills | Status: AC

## 2021-11-05 MED ORDER — HYDROCHLOROTHIAZIDE 25 MG PO TABS: 25.0000 mg | ORAL_TABLET | Freq: Every morning | ORAL | 3 refills | Status: DC

## 2021-11-05 MED ORDER — METOPROLOL SUCCINATE ER 25 MG PO TB24: 25.0000 mg | ORAL_TABLET | Freq: Every day | ORAL | 2 refills | Status: DC

## 2021-11-05 MED ORDER — MELOXICAM 7.5 MG PO TABS: 7.5000 mg | ORAL_TABLET | Freq: Every day | ORAL | 0 refills | Status: DC

## 2021-11-05 NOTE — Telephone Encounter (Signed)
Attempted to call patient to relay results. UA from clinic this afternoon consistent with UTI. Njo answer, left HIPAA safe VM. Rx macrobid BID x 7 days.   Ezequiel Essex, MD

## 2021-11-05 NOTE — Patient Instructions (Signed)
It was wonderful to meet you today. Thank you for allowing me to be a part of your care. Below is a short summary of what we discussed at your visit today:  Knee complaint Go for knee x-rays at the imaging center below. Walk in any time during normal business hours.   I have sent a referral to physical therapy. They can evaluate and help you to strengthen the knee joint and surrounding muscles.   We will try daily meloxicam for 1-2 weeks to help reduce the swelling.   [facility] [address], [place], [place] [address] [phone] Mon-Fri 8-5     Health Maintenance We like to think about ways to keep you healthy for years to come. Below are some interventions and screenings we can offer to keep you healthy: - mammogram (ordered, see below) - colonoscopy (referred to GI clinic, they should call you directly to schedule appointment) - shingles vaccine (can be obtained from your favorite pharmacy)  Mammogram I have ordered your routine mammogram to screen for breast cancer. This will be at the [facility]. You will call them directly to make an appointment at your convenience. Information below.     Cooking and Nutrition Classes [employer] in [place] provides many classes at low or no cost to [employer], nutrition, and agriculture.  Their website offers a huge variety of information related to topics such as gardening, nutrition, cooking, parenting, and health.  Also listed are classes and events, both online and in-person.  Check out their website here: [email or web address]    Please bring all of your medications to every appointment!  If you have any questions or concerns, please do not hesitate to contact us via phone or MyChart message.   Ezequiel Essex, MD
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
## 2021-11-06 ENCOUNTER — Telehealth: Payer: Self-pay | Admitting: Family Medicine

## 2021-11-06 DIAGNOSIS — G8929 Other chronic pain: Secondary | ICD-10-CM

## 2021-11-06 DIAGNOSIS — Z0000 Encounter for general adult medical examination without abnormal findings: Secondary | ICD-10-CM | POA: Insufficient documentation

## 2021-11-06 DIAGNOSIS — M25561 Pain in right knee: Secondary | ICD-10-CM

## 2021-11-06 NOTE — Assessment & Plan Note (Signed)
Acute.  UA consistent with UTI.  Rx Macrobid x7 days.

## 2021-11-06 NOTE — Assessment & Plan Note (Signed)
Due for colonoscopy, referred to GI.

## 2021-11-06 NOTE — Assessment & Plan Note (Signed)
Due for mammogram, ordered today.

## 2021-11-06 NOTE — Telephone Encounter (Signed)
Called patient to discuss.

## 2021-11-06 NOTE — Assessment & Plan Note (Signed)
Acute exacerbation of chronic problem.  Suspect osteoarthritis.  Right knee complete x-ray series, meloxicam trial x14 days, referral to PT.  Return precautions given.  See AVS for more.

## 2021-11-06 NOTE — Assessment & Plan Note (Signed)
Stable.  Patient requests refill of HCTZ and metoprolol.  BP elevated at this office visit as patient is out of his medications and was not able to take them this morning.  Tolerating well at home.  Return precautions given

## 2021-11-06 NOTE — Telephone Encounter (Signed)
Called patient to discuss knee XR results. Patient amenable to sports med referral for eval and treat.   Ezequiel Essex, MD

## 2021-11-07 ENCOUNTER — Encounter: Payer: Self-pay | Admitting: Family Medicine

## 2021-11-07 DIAGNOSIS — F411 Generalized anxiety disorder: Secondary | ICD-10-CM

## 2021-11-08 MED ORDER — BUSPIRONE HCL 7.5 MG PO TABS: ORAL_TABLET | ORAL | 2 refills | Status: DC

## 2021-11-13 ENCOUNTER — Ambulatory Visit (INDEPENDENT_AMBULATORY_CARE_PROVIDER_SITE_OTHER): Payer: Self-pay | Admitting: Family Medicine

## 2021-11-13 VITALS — BP 176/99 | Ht 68.0 in | Wt 214.0 lb

## 2021-11-13 DIAGNOSIS — G8929 Other chronic pain: Secondary | ICD-10-CM

## 2021-11-13 DIAGNOSIS — M25561 Pain in right knee: Secondary | ICD-10-CM

## 2021-11-13 MED ORDER — METHYLPREDNISOLONE ACETATE 40 MG/ML IJ SUSP
40.0000 mg | Freq: Once | INTRAMUSCULAR | Status: AC
Start: 2021-11-13 — End: 2021-11-13
  Administered 2021-11-13: 40 mg via INTRA_ARTICULAR

## 2021-11-13 NOTE — Progress Notes (Signed)
° °  [name] is a 56 y.o. female who presents to [facility] today for the following:  Chronic right knee pain Has been seen in the ED for this specifically on the right side and had a negative Doppler on the right Seen by PCP for this on 2/14 Was prescribed meloxicam Also referred to physical therapy and sent for right knee x-rays that showed mild to moderate medial compartment arthritis of the right knee Pain seems to be the worst over the medial joint line Reports that she feels popping in the front of her knee States that swells off and on in the front and back of her knee States the swelling has been improving over the last few days because she has been decreasing her time at work No injury 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  ROS as above. Medications reviewed.  Exam:  BP (!) 176/99    Ht 5\' 8"  (1.727 m)    Wt 214 lb (97.1 kg)    LMP 10/06/2013    BMI 32.54 kg/m  Gen: Well NAD MSK:  Right Knee: - Inspection: no gross deformity b/l. No swelling/effusion, erythema or bruising b/l. Skin intact - Palpation: Right knee has tenderness palpation over both medial and lateral joint lines - ROM: full active ROM with flexion and extension in knee and hip b/l - Strength: 5/5 strength b/l aside from 4 out of 5 hip abduction on the right - Neuro/vasc: NV intact distally b/l - Special Tests: - LIGAMENTS: negative anterior and posterior drawer, negative Lachman's, no MCL or LCL laxity  -- MENISCUS: negative McMurray's -- PF JOINT: nml patellar mobility bilaterally.  1+ patellar crepitus with range of motion.  Hips: normal ROM  Limited MSK ultrasound right knee: Images were obtained both in the transverse and longitudinal plane. Patellar and quadriceps tendons were well visualized with no abnormalities. Moderate effusion within the suprapatellar  pouch.  Very small Baker's cyst in the posterior aspect. Impression: Moderate effusion with very small Baker's cyst    Assessment and Plan: 1) Chronic pain of right knee Discussed risk benefits of a corticosteroid injection today given the patient has failed oral anti-inflammatories.  She opts to proceed with this today.  Also recommended compression sleeve while she is walking and on her feet a lot, specifically while at work.  Given quad strengthening and hip abduction series as well.  We will have her follow-up in 6 to 8 weeks to ensure that she is making improvement.  Procedure performed: knee intraarticular corticosteroid injection; palpation guided  Consent obtained and verified. Time-out conducted. Noted no overlying erythema, induration, or other signs of local infection. The right medial joint space was palpated and marked. The overlying skin was prepped in a sterile fashion. Topical analgesic spray: Ethyl chloride. Joint: right knee Needle: 22 gauge, 1.5 inch Completed without difficulty. Meds: 40 mg methylrprednisolone, 4 ml 1% lidocaine without epinephrine  Advised to call if fevers/chills, erythema, induration, drainage, or persistent bleeding.     [place] Constable, D.O.  PGY-4 [facility]  11/13/2021 3:00 PM  Addendum:  I was the preceptor for this visit and available for immediate consultation.  Karlton Lemon MD [place]

## 2021-11-13 NOTE — Patient Instructions (Signed)
Thank you for coming to see me today. It was a pleasure. Today we talked about:   Today you received an injection with corticosteroid. This injection is usually done in response to pain and inflammation. There is some "numbing" medicine also in the shot so the injected area may be numb and feel really good for the next couple of hours. The numbing medicine usually wears off in 2-3 hours though, and then your pain level will be right back where it was before the injection.   The actually benefit from the steroid injection is usually noticed in 2-7 days. You may actually experience a small (as in 10%) INCREASE in pain in the first 24 hours---that is common.   Things to watch out for that you should contact us or a health care provider urgently would include: 1. Unusual (as in more than 10%) increase in pain 2. New fever > 101.5 3. New swelling or redness of the injected area.  4. Streaking of red lines around the area injected.   I have also given you some exercises that should help.  Wear a compression sleeve when you will be on your feet a lot.  Do not use when you are sleeping.  Please follow-up with [place] in 6-8 weeks.  If you have any questions or concerns, please do not hesitate to call the office at [phone].  Best,   [place] Constable, DO [place]

## 2021-11-13 NOTE — Assessment & Plan Note (Signed)
Discussed risk benefits of a corticosteroid injection today given the patient has failed oral anti-inflammatories.  She opts to proceed with this today.  Also recommended compression sleeve while she is walking and on her feet a lot, specifically while at work.  Given quad strengthening and hip abduction series as well.  We will have her follow-up in 6 to 8 weeks to ensure that she is making improvement.  Procedure performed: knee intraarticular corticosteroid injection; palpation guided  Consent obtained and verified. Time-out conducted. Noted no overlying erythema, induration, or other signs of local infection. The right medial joint space was palpated and marked. The overlying skin was prepped in a sterile fashion. Topical analgesic spray: Ethyl chloride. Joint: right knee Needle: 22 gauge, 1.5 inch Completed without difficulty. Meds: 40 mg methylrprednisolone, 4 ml 1% lidocaine without epinephrine  Advised to call if fevers/chills, erythema, induration, drainage, or persistent bleeding.

## 2022-01-01 ENCOUNTER — Ambulatory Visit: Payer: Self-pay | Admitting: Family Medicine

## 2022-01-01 NOTE — Progress Notes (Deleted)
? ?  [name] is a 56 y.o. female who presents to [facility] today for the following: ? ?Right knee pain follow-up ?Last seen for same on 2/22 ?Given a CSI that day in right knee ?Given home exercise program and advised to use compression sleeve while walking ?*** ? ? ? ?PMH reviewed.  ?ROS as above. ?Medications reviewed. ? ?Exam:  ?LMP 10/06/2013  ?Gen: Well NAD ?MSK: ? ?*** Knee: ?- Inspection: no gross deformity b/l. No swelling/effusion, erythema or bruising b/l. Skin intact ?- Palpation: no TTP b/l ?- ROM: full active ROM with flexion and extension in knee and hip b/l ?- Strength: 5/5 strength b/l ?- Neuro/vasc: NV intact distally b/l ?- Special Tests: ?- LIGAMENTS: negative anterior and posterior drawer, negative Lachman's, no MCL or LCL laxity  ?-- MENISCUS: negative McMurray's, negative Thessaly  ?-- PF JOINT: nml patellar mobility bilaterally.  negative patellar grind, negative patellar apprehension ? ?Hips: normal ROM, negative FABER and FADIR bilaterally ? ? ?No results found. ? ? ?Assessment and Plan: ?1) No problem-specific Assessment & Plan notes found for this encounter. ? ? ?Arizona Constable, D.O.  ?PGY-4 [facility]  ?01/01/2022 8:18 AM ?

## 2022-02-12 ENCOUNTER — Ambulatory Visit (INDEPENDENT_AMBULATORY_CARE_PROVIDER_SITE_OTHER): Payer: Self-pay | Admitting: Family Medicine

## 2022-02-12 ENCOUNTER — Encounter: Payer: Self-pay | Admitting: Family Medicine

## 2022-02-12 VITALS — BP 153/111 | HR 76

## 2022-02-12 DIAGNOSIS — M940 Chondrocostal junction syndrome [Tietze]: Secondary | ICD-10-CM

## 2022-02-12 DIAGNOSIS — R2232 Localized swelling, mass and lump, left upper limb: Secondary | ICD-10-CM

## 2022-02-12 DIAGNOSIS — R399 Unspecified symptoms and signs involving the genitourinary system: Secondary | ICD-10-CM

## 2022-02-12 DIAGNOSIS — Z111 Encounter for screening for respiratory tuberculosis: Secondary | ICD-10-CM

## 2022-02-12 DIAGNOSIS — R300 Dysuria: Secondary | ICD-10-CM

## 2022-02-12 DIAGNOSIS — S46812A Strain of other muscles, fascia and tendons at shoulder and upper arm level, left arm, initial encounter: Secondary | ICD-10-CM

## 2022-02-12 LAB — POCT URINALYSIS DIP (MANUAL ENTRY)
Bilirubin, UA: NEGATIVE
Blood, UA: NEGATIVE
Glucose, UA: NEGATIVE mg/dL
Ketones, POC UA: NEGATIVE mg/dL
Nitrite, UA: NEGATIVE
Protein Ur, POC: NEGATIVE mg/dL
Spec Grav, UA: 1.02 (ref 1.010–1.025)
Urobilinogen, UA: 0.2 E.U./dL
pH, UA: 5.5 (ref 5.0–8.0)

## 2022-02-12 LAB — POCT UA - MICROSCOPIC ONLY
Epithelial cells, urine per micros: >20
RBC, Urine, Miroscopic: NONE SEEN (ref 0–2)

## 2022-02-12 MED ORDER — NAPROXEN 500 MG PO TABS: 500.0000 mg | ORAL_TABLET | Freq: Two times a day (BID) | ORAL | 0 refills | Status: DC | PRN

## 2022-02-12 NOTE — Assessment & Plan Note (Signed)
Persists despite antibiotic treatment 3 months prior. Newly developing flank pain in the past 2 weeks. UA with leukocytes but no nitrites, no definitive UTI. Less likely kidney stones. - f/u urine culture, will defer antibiotics until culture results

## 2022-02-12 NOTE — Progress Notes (Unsigned)
SUBJECTIVE:   CHIEF COMPLAINT / HPI:  Chief Complaint  Patient presents with   Flank Pain    Treated for UTI 3 months ago with nitrofurantoin. Feels symptoms of dysuria and urinary frequency never resolved after antibiotic treatment. She started to develop flank pain bilaterally worse in the right about 2 weeks ago. She has felt subjective fever over the past few days but no measured temperature. Denies hematuria, vaginal discharge, cough, congestion, sore throat.  She also started developing pain in the left side of her neck radiating down her back and chest over the past few days. Denies any injury or recent change in activity. She has been taking ibuprofen on occasion with some relief.  She also reports having a mass on her left elbow that started in the past few months and has been increasing in size. It causes discomfort.  PERTINENT  PMH / PSH: HTN, CAD, obesity  Patient Care Team: Lurline Del, DO as PCP - General (Family Medicine) Gala Romney, Cristopher Estimable, MD as Consulting Physician (Gastroenterology)   OBJECTIVE:   BP (!) 153/111 Comment: hasnt had meds today  Pulse 76   LMP 10/06/2013   SpO2 97%   Physical Exam Constitutional:      General: She is not in acute distress.    Appearance: Normal appearance. She is obese.  HENT:     Head: Normocephalic and atraumatic.  Neck:     Comments: Full ROM without pain. There is tenderness along the left cervical paraspinal area and back along the trapezius distribution and in the anterior upper chest. Cardiovascular:     Rate and Rhythm: Normal rate and regular rhythm.  Pulmonary:     Effort: Pulmonary effort is normal. No respiratory distress.     Breath sounds: Normal breath sounds.  Abdominal:     General: Bowel sounds are normal.     Palpations: Abdomen is soft.     Tenderness: There is no guarding.     Comments: Soft, mild suprapubic tenderness. There is mild CVA tenderness.  Musculoskeletal:     Cervical back: Normal range  of motion and neck supple.     Comments: No midline spinal or paraspinal tenderness.  Skin:    Comments: Left antecubital region with approximately 3 cm x 3 cm soft mass which is not mobile, mildly tender with palpation.  No surrounding erythema.  Neurological:     Mental Status: She is alert.          02/12/2022    2:21 PM  Depression screen PHQ 2/9  Decreased Interest 0  Down, Depressed, Hopeless 0  PHQ - 2 Score 0  Altered sleeping 0  Tired, decreased energy 0  Change in appetite 0  Feeling bad or failure about yourself  0  Trouble concentrating 0  Moving slowly or fidgety/restless 0  Suicidal thoughts 0  PHQ-9 Score 0     {Show previous vital signs (optional):23777}    ASSESSMENT/PLAN:   Dysuria Persists despite antibiotic treatment 3 months prior. Newly developing flank pain in the past 2 weeks. UA with leukocytes but no nitrites, no definitive UTI.  Possibly resistant UTI with possible mild pyelonephritis.  Less likely kidney stones. - f/u urine culture, will defer antibiotics until culture results  Elbow mass, left Gradually enlarging mass in the left elbow antecubital region for the past few months.  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 - US soft tissue - consider general surgery referral pending  US findings   Left-sided neck pain Acute atraumatic left-sided neck pain radiating into the upper back and chest which is tender with palpation.  Suspect this is likely musculoskeletal possible trapezius sprain and/or costochondritis. - neck exercises - short course naproxen BID prn  Return if symptoms worsen or fail to improve.   Zola Button, MD [place]

## 2022-02-12 NOTE — Patient Instructions (Addendum)
It was nice seeing you today!  Try neck exercises most days of the week.  Take naproxen up to twice a day as needed for pain. Do no take ibuprofen or other pain medications with this. Tylenol is safe to take with this.  Get your ultrasound as scheduled.  I will let you know about your urine test results when available.  Stay well, Zola Button, MD [place] [phone]  --  Make sure to check out at the front desk before you leave today.  Please arrive at least 15 minutes prior to your scheduled appointments.  If you had blood work today, I will send you a MyChart message or a letter if results are normal. Otherwise, I will give you a call.  If you had a referral placed, they will call you to set up an appointment. Please give [place] a call if you don't hear back in the next 2 weeks.  If you need additional refills before your next appointment, please call your pharmacy first.

## 2022-02-13 DIAGNOSIS — R2232 Localized swelling, mass and lump, left upper limb: Secondary | ICD-10-CM | POA: Insufficient documentation

## 2022-02-13 NOTE — Assessment & Plan Note (Signed)
Gradually enlarging mass in the left elbow antecubital region for the past few months.  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 - US soft tissue - consider general surgery referral pending US findings

## 2022-02-14 ENCOUNTER — Other Ambulatory Visit (HOSPITAL_COMMUNITY)
Admission: RE | Admit: 2022-02-14 | Discharge: 2022-02-14 | Disposition: A | Discharge status: Home / Self Care | Payer: Self-pay | Source: Ambulatory Visit | Attending: Family Medicine | Admitting: Family Medicine

## 2022-02-14 ENCOUNTER — Ambulatory Visit (INDEPENDENT_AMBULATORY_CARE_PROVIDER_SITE_OTHER): Payer: Self-pay | Admitting: Family Medicine

## 2022-02-14 ENCOUNTER — Ambulatory Visit: Payer: Self-pay

## 2022-02-14 DIAGNOSIS — N898 Other specified noninflammatory disorders of vagina: Secondary | ICD-10-CM

## 2022-02-14 DIAGNOSIS — Z111 Encounter for screening for respiratory tuberculosis: Secondary | ICD-10-CM

## 2022-02-14 LAB — POCT WET PREP (WET MOUNT)
Clue Cells Wet Prep Whiff POC: NEGATIVE
Trichomonas Wet Prep HPF POC: ABSENT

## 2022-02-14 LAB — TB SKIN TEST
Induration: 0 mm
TB Skin Test: NEGATIVE

## 2022-02-14 LAB — URINE CULTURE

## 2022-02-14 NOTE — Patient Instructions (Signed)
It was good seeing you today.  I am sorry having this pelvic pain.  We looked at swabs under the microscope and did not see anything abnormal.  We are sending it off for other test.  I will call you with the results of anything comes back positive.  If your symptoms worsen, you start to have worsening abdominal pain or pelvic pain, you have fever when she reevaluated immediately.  I hope you have a wonderful afternoon!

## 2022-02-14 NOTE — Progress Notes (Signed)
PPD Reading Note PPD read and results entered in EpicCare. Result: 0 mm induration. Interpretation: Negative Allergic reaction: Yes  

## 2022-02-14 NOTE — Addendum Note (Signed)
Addended by: Dorna Bloom on: 02/14/2022 03:19 PM   Modules accepted: Orders

## 2022-02-14 NOTE — Progress Notes (Unsigned)
    SUBJECTIVE:   CHIEF COMPLAINT / HPI:   Pelvic pain Patient presents for evaluation of pelvic and back pain.  She was recently seen and was concerned about having a possible UTI.  Urinalysis not consistent with UTI and urine culture negative for UTI.  She reports she was thinking after the visit about if she had had any vaginal symptoms and she did acknowledge she had some increased discharge and irritation in the pelvic area.  It is sexually active usually 1-2 times per month and it has been a few weeks since she was sexually active.  She reports that the symptoms started approximately 4 days after she was last sexually active.  Denies any bleeding.  Denies any systemic symptoms such as fever or chills.  OBJECTIVE:   BP 138/86   Pulse 75   Ht '5\' 8"'$  (1.727 m)   LMP 10/06/2013   SpO2 98%   BMI 32.54 kg/m   General: Pleasant 56 year old female Cardiac: Regular rate and rhythm Respiratory: No work of breathing, speaking full sentences Abdomen: Soft, mild pelvic tenderness GU: Normal external female genitalia, normal vaginal mucosa with mild to moderate amount of yellowish discharge, no cervical motion tenderness, no friability of the cervix  ASSESSMENT/PLAN:   Vaginal discharge Concern for some infection on exam today.  Patient did have mild to moderate amount of yellowish discharge.  Wet prep today was negative.  Will await GC/chlamydia/BV/yeast/trichomonas on ancillary cytology test and treat as needed.     Gifford Shave, MD [place]

## 2022-02-15 NOTE — Assessment & Plan Note (Signed)
Concern for some infection on exam today.  Patient did have mild to moderate amount of yellowish discharge.  Wet prep today was negative.  Will await GC/chlamydia/BV/yeast/trichomonas on ancillary cytology test and treat as needed.

## 2022-02-18 ENCOUNTER — Encounter: Payer: Self-pay | Admitting: Family Medicine

## 2022-02-19 LAB — CERVICOVAGINAL ANCILLARY ONLY
Chlamydia: NEGATIVE
Comment: NEGATIVE
Comment: NEGATIVE
Comment: NORMAL
Neisseria Gonorrhea: NEGATIVE
Trichomonas: NEGATIVE

## 2022-02-19 NOTE — Telephone Encounter (Signed)
Called patient to discuss results from vaginal cytology.  No positive results.  Patient reports that the symptoms are worse and she is having itching with urination and burning and irritation in her pelvic area.  If placed future order for repeat urinalysis and urine culture.  Will treat if needed.  Patient may require further visits.

## 2022-02-21 ENCOUNTER — Ambulatory Visit (HOSPITAL_COMMUNITY): Payer: Self-pay | Attending: Family Medicine

## 2022-02-25 ENCOUNTER — Encounter: Payer: Self-pay | Admitting: *Deleted

## 2022-03-07 ENCOUNTER — Ambulatory Visit: Payer: Self-pay

## 2022-03-11 ENCOUNTER — Ambulatory Visit (INDEPENDENT_AMBULATORY_CARE_PROVIDER_SITE_OTHER): Payer: Self-pay | Admitting: Family Medicine

## 2022-03-11 VITALS — HR 66 | Ht 68.0 in

## 2022-03-11 DIAGNOSIS — H109 Unspecified conjunctivitis: Secondary | ICD-10-CM

## 2022-03-11 DIAGNOSIS — B9689 Other specified bacterial agents as the cause of diseases classified elsewhere: Secondary | ICD-10-CM

## 2022-03-11 DIAGNOSIS — J302 Other seasonal allergic rhinitis: Secondary | ICD-10-CM

## 2022-03-11 MED ORDER — OLOPATADINE HCL 0.2 % OP SOLN: 1.0000 [drp] | Freq: Every day | OPHTHALMIC | 0 refills | Status: DC

## 2022-03-11 MED ORDER — ERYTHROMYCIN 5 MG/GM OP OINT
1.0000 | TOPICAL_OINTMENT | Freq: Two times a day (BID) | OPHTHALMIC | 0 refills | Status: AC
Start: 2022-03-11 — End: 2022-03-16

## 2022-03-11 NOTE — Progress Notes (Signed)
   SUBJECTIVE:   CHIEF COMPLAINT / HPI:     [name] is a 56 y.o. female here for ongoing styes in both of her eyes.  States that the symptoms started in her left eye about 3 weeks ago.  She had repeated back-to-back styes and now she is starting to have them in her right eye.  She has itching and burning in her eyes.  Her ears at times as well.  She had some nasal discharge that cleared up with Claritin.  She had an elevated temperature at home but no fever.      PERTINENT  PMH / PSH: reviewed and updated as appropriate   OBJECTIVE:   Pulse 66   Ht '5\' 8"'$  (1.727 m)   LMP 10/06/2013   SpO2 93%   BMI 32.54 kg/m    GEN: well appearing female in no acute distress  EYES: Multiple areas of purulent drainage along the lower lid margin in both eyes.  Erythema and mild edema of the left upper eyelid.  Gross vision intact.  Scleral injection.  HENT: Bilateral turbinate hypertrophy, maxillary sinus tenderness is mild CVS: well perfused  RESP: speaking in full sentences without pause, no respiratory distress  SKIN: warm, dry    ASSESSMENT/PLAN:   Conjunctivitis There is an allergy component however with the purulence I am concerned that there may be bacterial infection.  Treat with erythromycin ointment and Pataday eye drops.  Return precautions provided.  Patient to follow-up in clinic in 1 week if symptoms or not improved.     Lyndee Hensen, DO PGY-3, [facility] 03/11/2022

## 2022-03-11 NOTE — Patient Instructions (Signed)
Stop by the pharmacy to pick up your antibiotics. Apply 1 cm ribbon every 4 hours while awake for the next 5 days.    Take Claritin daily for seasonal allergies.   Follow up in the office in 7 days if not improved.

## 2022-03-17 ENCOUNTER — Encounter: Payer: Self-pay | Admitting: Family Medicine

## 2022-03-17 ENCOUNTER — Ambulatory Visit (INDEPENDENT_AMBULATORY_CARE_PROVIDER_SITE_OTHER): Payer: Self-pay | Admitting: Family Medicine

## 2022-03-17 VITALS — BP 166/98 | HR 78 | Ht 68.0 in

## 2022-03-17 DIAGNOSIS — I10 Essential (primary) hypertension: Secondary | ICD-10-CM

## 2022-03-17 DIAGNOSIS — J0110 Acute frontal sinusitis, unspecified: Secondary | ICD-10-CM

## 2022-03-17 DIAGNOSIS — I2510 Atherosclerotic heart disease of native coronary artery without angina pectoris: Secondary | ICD-10-CM

## 2022-03-17 MED ORDER — ROSUVASTATIN CALCIUM 20 MG PO TABS: 20.0000 mg | ORAL_TABLET | Freq: Every day | ORAL | 3 refills | Status: DC

## 2022-03-17 MED ORDER — AMOXICILLIN-POT CLAVULANATE 875-125 MG PO TABS: 1.0000 | ORAL_TABLET | Freq: Two times a day (BID) | ORAL | 0 refills | Status: AC

## 2022-03-17 MED ORDER — FLUTICASONE PROPIONATE 50 MCG/ACT NA SUSP: 2.0000 | Freq: Every day | NASAL | 6 refills | Status: DC

## 2022-03-17 NOTE — Progress Notes (Signed)
    SUBJECTIVE:   CHIEF COMPLAINT / HPI: f/u for bacterial conjunctivitis   Patient presents after being treated for bacterial conjunctivitis with allergic component  She reports using the erythromycin ointment drops and pataday drops as recommended  Today she reports that her eye symptoms improved however she is now having sinus related symptoms including stuffy nose, sinus pressure, HA.   She reports the symptoms have been present for >2 weeks and she is requesting to have medication to treat her symptoms.   CAD  Patient does not currently take statin therapy. She is agreeable to starting this today and checking lipid levels. She states that she does not follow with cardiology at this time.   HTN  Patient reports adherence to losartan and HCTZ and BB for her BP. She states that whenever her BP is checked with a machine, it is usually elevated and so she usually likes for it to be checked with a manual cuff.   PERTINENT  PMH / PSH:  HTN  CAD  Angina, stable   OBJECTIVE:   BP (!) 166/98   Pulse 78   Ht '5\' 8"'$  (1.727 m)   LMP 10/06/2013   SpO2 100%   BMI 32.54 kg/m   Physical Exam Constitutional:      General: She is not in acute distress.    Appearance: She is obese.  HENT:     Nose:     Right Turbinates: Swollen.     Left Turbinates: Swollen.     Mouth/Throat:     Mouth: Mucous membranes are moist.     Tonsils: No tonsillar exudate or tonsillar abscesses.  Eyes:     Conjunctiva/sclera: Conjunctivae normal.  Cardiovascular:     Rate and Rhythm: Normal rate and regular rhythm.     Pulses: Normal pulses.     Heart sounds: Normal heart sounds.  Pulmonary:     Effort: Pulmonary effort is normal.     Breath sounds: Normal breath sounds. No wheezing or rhonchi.  Abdominal:     General: Bowel sounds are normal. There is no distension.     Palpations: Abdomen is soft.     Tenderness: There is no abdominal tenderness.  Neurological:     Mental Status: She is alert and  oriented to person, place, and time.      ASSESSMENT/PLAN:   CAD (coronary artery disease) - mild nonobstructive, LHC 02/23/2017 Patient is agreeable to starting rosuvastatin today  We will check lipid panel as well   Essential hypertension, benign We will check BMP today as patient is on anti-hypertensive medications  Acute non-recurrent frontal sinusitis Will treat for sinusitis as patient has been symptomatic for >2 weeks without improving symptoms  Augmentin x7 days      Eulis Foster, MD [place]

## 2022-03-18 LAB — BASIC METABOLIC PANEL
BUN/Creatinine Ratio: 25 — ABNORMAL HIGH (ref 9–23)
BUN: 18 mg/dL (ref 6–24)
CO2: 24 mmol/L (ref 20–29)
Calcium: 10.6 mg/dL — ABNORMAL HIGH (ref 8.7–10.2)
Chloride: 100 mmol/L (ref 96–106)
Creatinine, Ser: 0.71 mg/dL (ref 0.57–1.00)
Glucose: 135 mg/dL — ABNORMAL HIGH (ref 70–99)
Potassium: 4.2 mmol/L (ref 3.5–5.2)
Sodium: 139 mmol/L (ref 134–144)
eGFR: 100 mL/min/{1.73_m2} (ref >59)

## 2022-03-18 LAB — LIPID PANEL
Chol/HDL Ratio: 3.3 ratio (ref 0.0–4.4)
Cholesterol, Total: 214 mg/dL — ABNORMAL HIGH (ref 100–199)
HDL: 64 mg/dL (ref >39)
LDL Chol Calc (NIH): 123 mg/dL — ABNORMAL HIGH (ref 0–99)
Triglycerides: 157 mg/dL — ABNORMAL HIGH (ref 0–149)
VLDL Cholesterol Cal: 27 mg/dL (ref 5–40)

## 2022-03-18 LAB — HEMOGLOBIN A1C
Est. average glucose Bld gHb Est-mCnc: 137 mg/dL
Hgb A1c MFr Bld: 6.4 % — ABNORMAL HIGH (ref 4.8–5.6)

## 2022-03-23 DIAGNOSIS — J0110 Acute frontal sinusitis, unspecified: Secondary | ICD-10-CM | POA: Insufficient documentation

## 2022-03-23 NOTE — Assessment & Plan Note (Signed)
Will treat for sinusitis as patient has been symptomatic for >2 weeks without improving symptoms  Augmentin x7 days

## 2022-03-23 NOTE — Assessment & Plan Note (Signed)
Patient is agreeable to starting rosuvastatin today  We will check lipid panel as well

## 2022-03-23 NOTE — Assessment & Plan Note (Signed)
We will check BMP today as patient is on anti-hypertensive medications

## 2022-03-26 NOTE — Addendum Note (Signed)
Addended by: Adolph Pollack on: 03/26/2022 04:45 PM   Modules accepted: Orders

## 2022-04-22 ENCOUNTER — Other Ambulatory Visit: Payer: Self-pay | Admitting: Family Medicine

## 2022-04-22 DIAGNOSIS — I10 Essential (primary) hypertension: Secondary | ICD-10-CM

## 2022-05-21 ENCOUNTER — Other Ambulatory Visit: Payer: Self-pay

## 2022-05-21 DIAGNOSIS — I10 Essential (primary) hypertension: Secondary | ICD-10-CM

## 2022-05-21 MED ORDER — LOSARTAN POTASSIUM 100 MG PO TABS: 100.0000 mg | ORAL_TABLET | Freq: Every day | ORAL | 0 refills | Status: DC

## 2022-06-20 ENCOUNTER — Ambulatory Visit (HOSPITAL_BASED_OUTPATIENT_CLINIC_OR_DEPARTMENT_OTHER)
Admission: RE | Admit: 2022-06-20 | Discharge: 2022-06-20 | Disposition: A | Discharge status: Home / Self Care | Payer: Self-pay | Source: Ambulatory Visit | Attending: Family Medicine | Admitting: Family Medicine

## 2022-06-20 ENCOUNTER — Other Ambulatory Visit: Payer: Self-pay

## 2022-06-20 ENCOUNTER — Ambulatory Visit (INDEPENDENT_AMBULATORY_CARE_PROVIDER_SITE_OTHER): Payer: Self-pay | Admitting: Student

## 2022-06-20 VITALS — BP 188/103 | HR 84 | Wt 229.4 lb

## 2022-06-20 DIAGNOSIS — R070 Pain in throat: Secondary | ICD-10-CM

## 2022-06-20 DIAGNOSIS — I10 Essential (primary) hypertension: Secondary | ICD-10-CM

## 2022-06-20 MED ORDER — IOHEXOL 300 MG/ML  SOLN
100.0000 mL | Freq: Once | INTRAMUSCULAR | Status: AC | PRN
  Administered 2022-06-20: 100 mL via INTRAVENOUS

## 2022-06-20 NOTE — Assessment & Plan Note (Signed)
Markedly elevated in clinic today. Per patient, she just took her BP meds (Losartan, HCTZ, metoprolol ~5 min prior to presentation) and BP at home runs 130s/80s. - No intervention at this time - Close outpatient monitoring

## 2022-06-20 NOTE — Assessment & Plan Note (Addendum)
Unusual presentation. I am most concerned by her report of airway involvement. Fever raises concern for infectious process such as Ludwig's angina. No evidence of sialadenitis on exam. Could consider Angioedema given losartan use, but less typical with ARB and would not explain the throat pain or fever. Warrants further imaging evaluation. No evidence of respiratory distress or airway compromise at the time of present evaluation. Strict ED precautions discussed if recurrent concern for airway compromise.  - STAT CT Neck w Contrast at 1500 today - Will evaluate need for antibiotics/hospitalization/further imaging based on CT findings

## 2022-06-20 NOTE — Progress Notes (Signed)
    SUBJECTIVE:   CHIEF COMPLAINT / HPI:   Sore Throat Four days of progressive sore throat and fever, now with sensation of throat swelling. Awoke from sleep two nights ago and again this morning with the sensation that she couldn't breathe. Took her albuterol inhaler with minimal improvement. Sensation of choking did improve with time. Has had on-and-off subjective fevers over the four day course of illness. Has had to limit food and fluid intake secondary to pain.  She is unable to speak in a full voice secondary to pain. No chest pain or shortness of breath. Has not taken any medications.    OBJECTIVE:   BP (!) 188/103   Pulse 84   Wt 229 lb 6.4 oz (104.1 kg)   LMP 10/06/2013   SpO2 99%   BMI 34.88 kg/m   Gen: NAD, speaking in whispers to avoid pain  HENT: Uvula midline, oropharynx without erythema or exudate, 2+ tonsils bilaterally Mouth: No salivary gland drainage noted, no evidence of dental abscess Neck: Diffusely tender submandibular region extending to anterior neck, no adenopathy on exam, no obvious swelling or induration Pulm: Normal WOB on RA, lungs are clear bilaterally   ASSESSMENT/PLAN:   Throat pain in adult Unusual presentation. I am most concerned by her report of airway involvement. Fever raises concern for infectious process such as Ludwig's angina. No evidence of sialadenitis on exam. Could consider Angioedema given losartan use, but less typical with ARB and would not explain the throat pain or fever. Warrants further imaging evaluation. No evidence of respiratory distress or airway compromise at the time of present evaluation. Strict ED precautions discussed if recurrent concern for airway compromise.  - STAT CT Neck w Contrast at 1500 today - Will evaluate need for antibiotics/hospitalization/further imaging based on CT findings  Essential hypertension, benign Markedly elevated in clinic today. Per patient, she just took her BP meds (Losartan, HCTZ,  metoprolol ~5 min prior to presentation) and BP at home runs 130s/80s. - No intervention at this time - Close outpatient monitoring     Pearla Dubonnet, MD [place]

## 2022-06-20 NOTE — Patient Instructions (Addendum)
Ms Norton,  It is so nice to meet you--I'm so sorry your throat has been bothering you. You've got an unusual story and exam today--enough that I think we need to get a closer look. We will get a CT scan of your neck STAT and then decide where to go from there. I will let you know as soon as I've gotten the read back on your CT and we will make a plan regarding treatment. In the meantime, if you start to have difficulty breathing, please go to the emergency room immediately. If you have fever you can take some Tylenol.   Pearla Dubonnet, MD

## 2022-07-10 ENCOUNTER — Other Ambulatory Visit: Payer: Self-pay | Admitting: Student

## 2022-07-10 DIAGNOSIS — I10 Essential (primary) hypertension: Secondary | ICD-10-CM

## 2022-07-10 MED ORDER — LOSARTAN POTASSIUM 100 MG PO TABS: 100.0000 mg | ORAL_TABLET | Freq: Every day | ORAL | 0 refills | Status: DC

## 2022-07-28 ENCOUNTER — Other Ambulatory Visit (HOSPITAL_COMMUNITY)
Admission: RE | Admit: 2022-07-28 | Discharge: 2022-07-28 | Disposition: A | Discharge status: Home / Self Care | Payer: Self-pay | Source: Ambulatory Visit | Attending: Family Medicine | Admitting: Family Medicine

## 2022-07-28 ENCOUNTER — Ambulatory Visit (INDEPENDENT_AMBULATORY_CARE_PROVIDER_SITE_OTHER): Payer: Self-pay | Admitting: Family Medicine

## 2022-07-28 ENCOUNTER — Encounter: Payer: Self-pay | Admitting: Family Medicine

## 2022-07-28 VITALS — BP 134/86 | HR 65 | Ht 68.0 in | Wt 232.5 lb

## 2022-07-28 DIAGNOSIS — R399 Unspecified symptoms and signs involving the genitourinary system: Secondary | ICD-10-CM

## 2022-07-28 DIAGNOSIS — D271 Benign neoplasm of left ovary: Secondary | ICD-10-CM

## 2022-07-28 DIAGNOSIS — F411 Generalized anxiety disorder: Secondary | ICD-10-CM

## 2022-07-28 DIAGNOSIS — Z113 Encounter for screening for infections with a predominantly sexual mode of transmission: Secondary | ICD-10-CM | POA: Insufficient documentation

## 2022-07-28 DIAGNOSIS — I10 Essential (primary) hypertension: Secondary | ICD-10-CM

## 2022-07-28 DIAGNOSIS — F419 Anxiety disorder, unspecified: Secondary | ICD-10-CM

## 2022-07-28 LAB — POCT URINALYSIS DIP (MANUAL ENTRY)
Bilirubin, UA: NEGATIVE
Glucose, UA: NEGATIVE mg/dL
Ketones, POC UA: NEGATIVE mg/dL
Nitrite, UA: NEGATIVE
Protein Ur, POC: 30 mg/dL — AB
Spec Grav, UA: >=1.03 — AB (ref 1.010–1.025)
Urobilinogen, UA: 0.2 E.U./dL
pH, UA: 5.5 (ref 5.0–8.0)

## 2022-07-28 LAB — POCT WET PREP (WET MOUNT)
Clue Cells Wet Prep Whiff POC: NEGATIVE
Trichomonas Wet Prep HPF POC: ABSENT
WBC, Wet Prep HPF POC: >20

## 2022-07-28 LAB — POCT UA - MICROSCOPIC ONLY
Epithelial cells, urine per micros: >20
RBC, Urine, Miroscopic: NONE SEEN (ref 0–2)

## 2022-07-28 MED ORDER — BUSPIRONE HCL 7.5 MG PO TABS: ORAL_TABLET | ORAL | 2 refills | Status: DC

## 2022-07-28 MED ORDER — NITROFURANTOIN MONOHYD MACRO 100 MG PO CAPS: 100.0000 mg | ORAL_CAPSULE | Freq: Two times a day (BID) | ORAL | 0 refills | Status: DC

## 2022-07-28 NOTE — Progress Notes (Signed)
    SUBJECTIVE:   CHIEF COMPLAINT / HPI: pelvic pain  Spasms, burning with urination. Sometimes when she wipes there is blood. Pain is worse on right side. Has previously had UTI. History of kidney stones, on right side. Has had nausea, no vomiting. No fevers. Been sexually active with same person for last 10 years. Patient has low concern for STI. Has not been sexually active for several months, but has had symptoms for a long time. No vaginal discharge. Urine is darker, but not grossly bloody.  Last sexual activity 3 months ago, same partner for previous 10 years.  History of nephrolithiasis.  PERTINENT  PMH / PSH: Unstable angina, Anxiety, Benign neoplasm of left ovary  OBJECTIVE:   BP 134/86   Pulse 65   Ht '5\' 8"'$  (1.727 m)   Wt 232 lb 8 oz (105.5 kg)   LMP 10/06/2013   SpO2 98%   BMI 35.35 kg/m   General: NAD  Cardiovascular: RRR, no murmurs, no peripheral edema Respiratory: normal WOB on RA, CTAB, no wheezes, ronchi or rales Abdomen: soft, NTTP, no rebound or guarding GU:  Extremities: Moving all 4 extremities equally  Labs UA positive for bacteria, leukocytes. Wet prep negative  ASSESSMENT/PLAN:   Pelvic pain Vital stable, exam significant for mild costovertebral angle tenderness.  Differential includes kidney stone, urinary tract infection, pyelonephritis, constipation.  Given patient's history of nephrolithiasis possible right-sided kidney stone with likely concurrent urinary tract infection based on UA results.  Unlikely pyelonephritis with systemic symptoms.  -Counseled on regular hydration throughout the day -Macrobid 100 mg twice daily for 5 days. -Follow up as needed in 2 weeks if symptoms do not resolve. -Consider renal CT -STI labs pending, treat as appropriate  Benign neoplasm of left ovary Possibly contributing to patient's bilateral pelvic pain.  Discussed need for follow-up with patient based on last note visit by Dr. Elonda Husky in 2019.  -Pelvic ultrasound  ordered -Referral to gynecology placed  Essential hypertension, benign Blood pressure elevated initially, however patient stated she took blood pressure medications less than 20 minutes ago prior to arriving in clinic.  Repeat blood pressure at end of visit was 134/86.  No changes to current medications including losartan hydrochlorothiazide and metoprolol.     Salvadore Oxford, MD [place]

## 2022-07-28 NOTE — Patient Instructions (Addendum)
It was great to see you! Thank you for allowing me to participate in your care!  I recommend that you always bring your medications to each appointment as this makes it easy to ensure we are on the correct medications and helps [place] not miss when refills are needed.  Our plans for today:  - I have made a referral to Gynecology to follow up your benign ovarian neoplasm. They should call you to schedule. You should also be called to schedule your pelvic ultrasound. -Please take the antibiotic Macrobid twice daily for the next 5 days to treat your urinary tract infection. -If any of your results for sexually-transmitted infection are positive I will call you to discuss. -If your symptoms do not resolve in the next 2 weeks, please call the clinic for consideration of CT scan.  We are checking some labs today, I will call you if they are abnormal will send you a MyChart message or a letter if they are normal.  If you do not hear about your labs in the next 2 weeks please let us know.  Take care and seek immediate care sooner if you develop any concerns.   Dr. Salvadore Oxford, MD [place]

## 2022-07-28 NOTE — Assessment & Plan Note (Addendum)
Blood pressure elevated initially, patient is symptomatic.  Patient stated she took blood pressure medications less than 20 minutes ago prior to arriving in clinic.  Repeat blood pressure at end of visit was 134/86.  No changes to current medications including losartan hydrochlorothiazide and metoprolol.

## 2022-07-28 NOTE — Assessment & Plan Note (Signed)
Possibly contributing to patient's bilateral pelvic pain.  Discussed need for follow-up with patient based on last note visit by Dr. Elonda Husky in 2019.  -Pelvic ultrasound ordered -Referral to gynecology placed

## 2022-07-29 LAB — CERVICOVAGINAL ANCILLARY ONLY
Chlamydia: NEGATIVE
Comment: NEGATIVE
Comment: NEGATIVE
Comment: NORMAL
Neisseria Gonorrhea: NEGATIVE
Trichomonas: NEGATIVE

## 2022-07-29 LAB — HIV ANTIBODY (ROUTINE TESTING W REFLEX): HIV Screen 4th Generation wRfx: NONREACTIVE

## 2022-07-29 LAB — RPR: RPR Ser Ql: NONREACTIVE

## 2022-08-11 ENCOUNTER — Other Ambulatory Visit: Payer: Self-pay | Admitting: Student

## 2022-08-11 DIAGNOSIS — I10 Essential (primary) hypertension: Secondary | ICD-10-CM

## 2022-09-17 ENCOUNTER — Other Ambulatory Visit: Payer: Self-pay | Admitting: Student

## 2022-09-17 DIAGNOSIS — I10 Essential (primary) hypertension: Secondary | ICD-10-CM

## 2022-09-17 MED ORDER — HYDROCHLOROTHIAZIDE 25 MG PO TABS: 25.0000 mg | ORAL_TABLET | Freq: Every morning | ORAL | 0 refills | Status: DC

## 2022-11-06 ENCOUNTER — Other Ambulatory Visit: Payer: Self-pay | Admitting: Family Medicine

## 2022-11-06 DIAGNOSIS — I10 Essential (primary) hypertension: Secondary | ICD-10-CM

## 2022-11-06 DIAGNOSIS — R002 Palpitations: Secondary | ICD-10-CM

## 2022-11-06 MED ORDER — LOSARTAN POTASSIUM 100 MG PO TABS: 100.0000 mg | ORAL_TABLET | Freq: Every day | ORAL | 0 refills | Status: DC

## 2022-11-17 ENCOUNTER — Emergency Department (HOSPITAL_COMMUNITY)
Admission: EM | Admit: 2022-11-17 | Discharge: 2022-11-17 | Disposition: A | Discharge status: Home / Self Care | Payer: Self-pay | Attending: Emergency Medicine | Admitting: Emergency Medicine

## 2022-11-17 ENCOUNTER — Emergency Department (HOSPITAL_COMMUNITY): Payer: Self-pay

## 2022-11-17 DIAGNOSIS — Z91040 Latex allergy status: Secondary | ICD-10-CM | POA: Insufficient documentation

## 2022-11-17 DIAGNOSIS — M79641 Pain in right hand: Secondary | ICD-10-CM

## 2022-11-17 DIAGNOSIS — S60221A Contusion of right hand, initial encounter: Secondary | ICD-10-CM | POA: Insufficient documentation
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
## 2022-11-17 NOTE — ED Notes (Signed)
Pt refusing vitals.
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
## 2022-11-17 NOTE — Discharge Instructions (Addendum)
Please make an appointment with the primary care provider provide for you or a primary care provider of your choice.  I have attached a work note for you as well.  Please rest, ice 3-4 times a day for 15 minutes, wear the hand splint, elevate your hand is much as possible to reduce the swelling.  You may also alternate every 6 hours as needed for pain between 1000 mg Tylenol and 400 mg ibuprofen.  If swelling continues over the next few days with increasing pain please return to ER.

## 2023-01-01 ENCOUNTER — Ambulatory Visit: Payer: Self-pay | Admitting: Family Medicine

## 2023-01-02 ENCOUNTER — Ambulatory Visit (INDEPENDENT_AMBULATORY_CARE_PROVIDER_SITE_OTHER): Payer: Self-pay | Admitting: Family Medicine

## 2023-01-02 VITALS — BP 150/90 | HR 70 | Ht 68.0 in | Wt 219.1 lb

## 2023-01-02 DIAGNOSIS — H1032 Unspecified acute conjunctivitis, left eye: Secondary | ICD-10-CM

## 2023-01-02 DIAGNOSIS — I10 Essential (primary) hypertension: Secondary | ICD-10-CM

## 2023-01-02 MED ORDER — ERYTHROMYCIN 5 MG/GM OP OINT: 1.0000 | TOPICAL_OINTMENT | Freq: Four times a day (QID) | OPHTHALMIC | 0 refills | Status: DC

## 2023-01-02 MED ORDER — OLOPATADINE HCL 0.2 % OP SOLN: 1.0000 [drp] | Freq: Every day | OPHTHALMIC | 0 refills | Status: DC

## 2023-01-02 NOTE — Progress Notes (Signed)
    SUBJECTIVE:   CHIEF COMPLAINT / HPI:   Left Eye Redness Noticed itchiness of left eye ~1 week ago.  Thought it was allergies and took Claritin for a few days without improvement.  Left eye then became red, painful, and developed drainage. She reports crusting in the morning.  Remains very itchy.  She also describes gritty sensation.  Some blurriness in her vision from the left eye.  Denies trauma, injury, or foreign body in the eye.  Does not wear contacts. Has not tried drops or anything for relief. Subjective fever yesterday but did not check temperature.  No significant nasal congestion, cough, ear pain, or other symptoms.   PERTINENT  PMH / PSH: HTN, CAD, unstable angina, anxiety  OBJECTIVE:   BP (!) 150/90   Pulse 70   Ht 5\' 8"  (1.727 m)   Wt 219 lb 2 oz (99.4 kg)   LMP 10/06/2013   SpO2 97%   BMI 33.32 kg/m   Gen: alert, well-appearing, NAD Head: [place]/AT Eyes: PERRL, EOMI, no pain with extraocular movements, visual fields intact, fairly diffuse conjunctival injection of left eye, no significant drainage noted. Fluorescein exam without focal uptake. Ears: external ears, canals, and TMs normal bilaterally Nose: nares patent, no appreciable turbinate hypertrophy Throat: oropharynx unremarkable without tonsillar edema, erythema or exudate Neck: no cervical or supraclavicular lymphadenopathy Resp: normal effort Skin: no rashes on exposed skin Neuro: grossly intact  Vision Screening   Right eye Left eye Both eyes  Without correction 20/25 20/50 20/40   With correction        ASSESSMENT/PLAN:   Conjunctivitis Exact etiology unclear. Has some features of bacterial conjunctivitis (unilateral, associated drainage) and some allergic features (predominance of itching). No evidence of corneal abrasion on fluorescein exam. Visual acuity mildly reduced but no red flags on history/exam to prompt emergent ophtho evaluation. Trial erythromycin ointment and olapatadine drops. Advised  ophtho evaluation if no improvement.  Essential hypertension, benign BP elevated today x2. Endorses compliance with home meds. F/u with PCP for blood pressure visit.   Maury Dus, MD [facility]

## 2023-01-02 NOTE — Assessment & Plan Note (Signed)
BP elevated today x2. Endorses compliance with home meds. F/u with PCP for blood pressure visit.

## 2023-01-02 NOTE — Patient Instructions (Addendum)
It was great to see you!  I sent 2 different medications to your pharmacy. One is an antibiotic One is anti-allergy drops. Use them as prescribed.  I recommend scheduling an appointment with an eye doctor for follow-up. If you're improved you can always cancel.  Take care and seek immediate care sooner if you develop any concerns.  Dr. [facility]

## 2023-01-17 ENCOUNTER — Other Ambulatory Visit: Payer: Self-pay | Admitting: Student

## 2023-01-17 DIAGNOSIS — I10 Essential (primary) hypertension: Secondary | ICD-10-CM

## 2023-01-19 ENCOUNTER — Encounter: Payer: Self-pay | Admitting: Student

## 2023-02-10 ENCOUNTER — Ambulatory Visit (INDEPENDENT_AMBULATORY_CARE_PROVIDER_SITE_OTHER): Payer: Self-pay | Admitting: Family Medicine

## 2023-02-10 ENCOUNTER — Encounter: Payer: Self-pay | Admitting: Family Medicine

## 2023-02-10 ENCOUNTER — Other Ambulatory Visit: Payer: Self-pay

## 2023-02-10 VITALS — BP 135/81 | HR 78 | Temp 97.9°F | Ht 68.0 in | Wt 214.6 lb

## 2023-02-10 DIAGNOSIS — R300 Dysuria: Secondary | ICD-10-CM

## 2023-02-10 DIAGNOSIS — J019 Acute sinusitis, unspecified: Secondary | ICD-10-CM

## 2023-02-10 DIAGNOSIS — I10 Essential (primary) hypertension: Secondary | ICD-10-CM

## 2023-02-10 LAB — POCT URINALYSIS DIP (MANUAL ENTRY)
Blood, UA: NEGATIVE
Glucose, UA: NEGATIVE mg/dL
Ketones, POC UA: NEGATIVE mg/dL
Nitrite, UA: NEGATIVE
Protein Ur, POC: 30 mg/dL — AB
Spec Grav, UA: >=1.03 — AB (ref 1.010–1.025)
Urobilinogen, UA: 1 E.U./dL
pH, UA: 5.5 (ref 5.0–8.0)

## 2023-02-10 LAB — POCT UA - MICROSCOPIC ONLY
Epithelial cells, urine per micros: >20
RBC, Urine, Miroscopic: NONE SEEN (ref 0–2)

## 2023-02-10 MED ORDER — AMOXICILLIN-POT CLAVULANATE 500-125 MG PO TABS: 1.0000 | ORAL_TABLET | Freq: Two times a day (BID) | ORAL | 0 refills | Status: AC

## 2023-02-10 NOTE — Patient Instructions (Addendum)
It was wonderful to see you today.  Please bring ALL of your medications with you to every visit.   Today we talked about:  I believe that you are fighting a viral infection.  Unfortunately the only treatment for this is supportive care.  I encourage you to use nasal saline sprays, Vicks vapor rub (under your nose and on your chest) to help your congestion.  You can use sore throat spray to help with the discomfort. Honey, cough drops, and tessalon perles may help with your cough. Drink plenty of fluids! Alternate Tylenol and Ibuprofen every 6 hours as needed for pain or fevers.   I am treating you for a urinary tract infection. This would also cover a sinus infection. You can use sinus rinses, which may also help.   Thank you for coming to your visit as scheduled. We have had a large "no-show" problem lately, and this significantly limits our ability to see and care for patients. As a friendly reminder- if you cannot make your appointment please call to cancel. We do have a no show policy for those who do not cancel within 24 hours. Our policy is that if you miss or fail to cancel an appointment within 24 hours, 3 times in a 61-month period, you may be dismissed from our clinic.   Thank you for choosing [facility].   Please call [phone] with any questions about today's appointment.  Please be sure to schedule follow up at the front  desk before you leave today.   Sabino Dick, DO PGY-3 Family Medicine

## 2023-02-10 NOTE — Progress Notes (Signed)
    SUBJECTIVE:   CHIEF COMPLAINT / HPI:   [name] is a 57 y.o. female who presents to the [facility] clinic today to discuss the following concerns:   Sinus Concern Friday morning she started with sore throat. Fever started the next day, highest temperature 102. She has been alternating Ibuprofen and Tylenol. Just overall feeling bad. Feels increased pressure in her face. Also taking sudafed, Robitussin. +nasal congestion, green/brown in color. Right ear also hurts. No runny nose. Feels pressure behind right eye. Feels like its now going into chest because she is starting to cough. Mild SOB. Using her inhaler.   Not on rosuvastatin- does not desire to take it.   Urinary symptoms Onset: 5 days Dysuria: present Increased urinary frequency: No  Urinary urgency:  sometimes Incontinence: No  Abdominal pain:  suprapubic  Hematuria: Yes  Nausea/vomiting:  nausea but no vomiting Flank pain:  right flank pain Fever: Yes  Reports hx of UTI, kidney stones, pyelonephritis.   PERTINENT  PMH / PSH: HTN, CAD, HLD  OBJECTIVE:   BP (!) 141/78   Pulse 73   Temp 97.9 F (36.6 C) (Oral)   Ht 5\' 8"  (1.727 m)   Wt 214 lb 9.6 oz (97.3 kg)   LMP 10/06/2013   SpO2 97%   BMI 32.63 kg/m   Vitals:   02/10/23 1344 02/10/23 1404  BP: (!) 141/78 135/81  Pulse: 73 78  Temp: 97.9 F (36.6 C)   SpO2: 97%    General: NAD, pleasant, able to participate in exam HEENT: Mild ttp to frontal, maxillary, ethmoid sinuses R>L, no nasal drainage, oropharynx clear  Cardiac: RRR, no murmurs. Respiratory: CTAB, normal effort, No wheezes, rales or rhonchi Abdomen: Bowel sounds present, mild ttp suprapubic and epigastrum without R/G Back: No CVAT bilaterally  Skin: warm and dry, no rashes noted  ASSESSMENT/PLAN:   1. Dysuria Ongoing without hematuria on dipstick. Not a clean sample given >20 epithelial cells. Given sx and history will treat empirically. Follow culture and adjust as needed.  - POCT  urinalysis dipstick - Urine Culture - POCT UA - Microscopic Only - amoxicillin-clavulanate (AUGMENTIN) 500-125 MG tablet; Take 1 tablet by mouth 2 (two) times daily for 7 days.  Dispense: 14 tablet; Refill: 0 -Discussed ED precautions should she become dehydrated, have recurrent nausea or vomiting, have worsening back pain - Recommend increased hydration to help prevent UTI   2. Acute sinusitis, recurrence not specified, unspecified location Likely viral but given dysuria will treat with abx this would cover bacterial infection.  - amoxicillin-clavulanate (AUGMENTIN) 500-125 MG tablet; Take 1 tablet by mouth 2 (two) times daily for 7 days.  Dispense: 14 tablet; Refill: 0 -Recommend continued supportive care: tylenol, sinus rinses, etc   3. Primary hypertension Elevated but improved on recheck. Continue current medications. UTD on BMP.    Sabino Dick, DO [facility]

## 2023-02-13 LAB — URINE CULTURE

## 2023-02-18 ENCOUNTER — Ambulatory Visit: Payer: Self-pay

## 2023-02-23 ENCOUNTER — Encounter: Payer: Self-pay | Admitting: Student

## 2023-02-23 ENCOUNTER — Ambulatory Visit (INDEPENDENT_AMBULATORY_CARE_PROVIDER_SITE_OTHER): Payer: Self-pay | Admitting: Student

## 2023-02-23 VITALS — BP 134/78 | HR 70 | Ht 68.0 in | Wt 214.0 lb

## 2023-02-23 DIAGNOSIS — R002 Palpitations: Secondary | ICD-10-CM

## 2023-02-23 DIAGNOSIS — I10 Essential (primary) hypertension: Secondary | ICD-10-CM

## 2023-02-23 DIAGNOSIS — R109 Unspecified abdominal pain: Secondary | ICD-10-CM

## 2023-02-23 MED ORDER — LOSARTAN POTASSIUM 100 MG PO TABS: 100.0000 mg | ORAL_TABLET | Freq: Every day | ORAL | 0 refills | Status: DC

## 2023-02-23 MED ORDER — METOPROLOL SUCCINATE ER 25 MG PO TB24: 25.0000 mg | ORAL_TABLET | Freq: Every day | ORAL | 0 refills | Status: DC

## 2023-02-23 MED ORDER — HYDROCHLOROTHIAZIDE 25 MG PO TABS: 25.0000 mg | ORAL_TABLET | Freq: Every morning | ORAL | 0 refills | Status: DC

## 2023-02-23 NOTE — Progress Notes (Unsigned)
    SUBJECTIVE:   CHIEF COMPLAINT / HPI:   Dysuria Patient has complained of intermittent dysuria for several years now, frequently treated with antibiotics and tested negative for UTIs but symptoms persist.  Per patient she has history of UTIs, kidney stones, pyelonephritis.  She was last seen on 02/10/2023 complaining of dysuria and R flank pain, UA showed >20 epithelial cells.  She was treated with Augmentin which she completed - burning did stop but is still having R flank pain. No fevers, no nausea, no vomiting.  The diagnosis of interstitial cystitis has been discussed in the past as well as urology referral however per chart review it seems finances have been an issue with this.  PERTINENT  PMH / PSH: Dysuria  OBJECTIVE:   BP 134/78   Pulse 70   Ht 5\' 8"  (1.727 m)   Wt 214 lb (97.1 kg)   LMP 10/06/2013   SpO2 97%   BMI 32.54 kg/m    General: NAD, pleasant, able to participate in exam Cardiac: Well perfused  Respiratory: normal WOB Abdomen: Bowel sounds present, abdomen nontender, nondistended, soft, mild R flank pain  Skin: warm and dry Neuro: alert, no obvious focal deficits Psych: Normal affect and mood  ASSESSMENT/PLAN:   Flank pain D/t hx of reoccurring flank pain, dysuria and hx of kidney stones and pyelonephritis, I feel pt would benefit from urology referral at this time which she agree with. No need for further work up at this time as dysuria resolved w/ Augmentin, flank pain is mild, no nausea/vomiting, and vitals are stable. Pt encouraged to stay hydrated w/ water. -urology referral placed.    HTN medications refilled per pt request  Pt to return to discuss health maintenance in near future   Dr. Erick Alley, DO [place] [facility]

## 2023-02-23 NOTE — Patient Instructions (Addendum)
It was great to see you! Thank you for allowing me to participate in your care!  I recommend that you always bring your medications to each appointment as this makes it easy to ensure you are on the correct medications and helps [place] not miss when refills are needed.  Our plans for today:  - I referred you to urology. They will call to schedule an appointment  - If you develop nausea, fever, or vomiting along with the pain please return or go to the emergency department  - Return in about a month to discuss health maintenance   Take care and seek immediate care sooner if you develop any concerns.   Dr. Erick Alley, DO [facility]

## 2023-02-24 DIAGNOSIS — R109 Unspecified abdominal pain: Secondary | ICD-10-CM | POA: Insufficient documentation

## 2023-02-24 NOTE — Assessment & Plan Note (Signed)
D/t hx of reoccurring flank pain, dysuria and hx of kidney stones and pyelonephritis, I feel pt would benefit from urology referral at this time which she agree with. No need for further work up at this time as dysuria resolved w/ Augmentin, flank pain is mild, no nausea/vomiting, and vitals are stable. Pt encouraged to stay hydrated w/ water. -urology referral placed.

## 2023-03-18 ENCOUNTER — Ambulatory Visit: Payer: Self-pay | Admitting: Urology

## 2023-03-25 ENCOUNTER — Ambulatory Visit: Payer: Self-pay | Admitting: Urology

## 2023-03-25 ENCOUNTER — Other Ambulatory Visit: Payer: Self-pay

## 2023-03-25 DIAGNOSIS — R310 Gross hematuria: Secondary | ICD-10-CM

## 2023-03-25 DIAGNOSIS — Z87442 Personal history of urinary calculi: Secondary | ICD-10-CM

## 2023-04-15 ENCOUNTER — Ambulatory Visit: Payer: Self-pay | Admitting: Urology

## 2023-04-15 DIAGNOSIS — Z87442 Personal history of urinary calculi: Secondary | ICD-10-CM

## 2023-04-22 ENCOUNTER — Other Ambulatory Visit: Payer: Self-pay

## 2023-04-22 ENCOUNTER — Encounter: Payer: Self-pay | Admitting: Family Medicine

## 2023-04-22 ENCOUNTER — Ambulatory Visit (INDEPENDENT_AMBULATORY_CARE_PROVIDER_SITE_OTHER): Payer: Self-pay | Admitting: Family Medicine

## 2023-04-22 VITALS — BP 169/98 | HR 70 | Ht 68.0 in | Wt 217.4 lb

## 2023-04-22 DIAGNOSIS — H00019 Hordeolum externum unspecified eye, unspecified eyelid: Secondary | ICD-10-CM | POA: Insufficient documentation

## 2023-04-22 DIAGNOSIS — H00014 Hordeolum externum left upper eyelid: Secondary | ICD-10-CM

## 2023-04-22 DIAGNOSIS — I10 Essential (primary) hypertension: Secondary | ICD-10-CM

## 2023-04-22 NOTE — Assessment & Plan Note (Signed)
Given duration may be turning into a chalazion.  No signs of conjunctivitis or cellulitis.  Will refer to ophthalmology but hopefully will resolve by then.  Warm compresses at least four times a day

## 2023-04-22 NOTE — Addendum Note (Signed)
Addended by: Pearlean Brownie L on: 04/22/2023 10:25 AM   Modules accepted: Level of Service
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
## 2023-04-22 NOTE — Patient Instructions (Addendum)
Good to see you today - Thank you for coming in  Things we discussed today:  Stye - warm compress four times a day every day - If severely worsening let me know  Blood pressure - take your medications   Follow up with your PCP Dr Yetta Barre in next 1-2 months

## 2023-04-22 NOTE — Assessment & Plan Note (Signed)
Not controlled due to adherence.  Discussed importance of taking medications regularly.  Recommend close follow up with her PCP and bringing in all her medications  .

## 2023-05-20 ENCOUNTER — Ambulatory Visit (INDEPENDENT_AMBULATORY_CARE_PROVIDER_SITE_OTHER): Payer: Self-pay | Admitting: Family Medicine

## 2023-05-20 ENCOUNTER — Ambulatory Visit (HOSPITAL_COMMUNITY)
Admission: RE | Admit: 2023-05-20 | Discharge: 2023-05-20 | Disposition: A | Discharge status: Home / Self Care | Payer: Self-pay | Source: Ambulatory Visit | Attending: Family Medicine | Admitting: Family Medicine

## 2023-05-20 ENCOUNTER — Telehealth: Payer: Self-pay | Admitting: Family Medicine

## 2023-05-20 VITALS — BP 135/80 | HR 66 | Ht 68.0 in | Wt 219.6 lb

## 2023-05-20 DIAGNOSIS — R109 Unspecified abdominal pain: Secondary | ICD-10-CM

## 2023-05-20 DIAGNOSIS — N12 Tubulo-interstitial nephritis, not specified as acute or chronic: Secondary | ICD-10-CM

## 2023-05-20 DIAGNOSIS — R300 Dysuria: Secondary | ICD-10-CM

## 2023-05-20 LAB — POCT URINALYSIS DIP (MANUAL ENTRY)
Bilirubin, UA: NEGATIVE
Glucose, UA: NEGATIVE mg/dL
Ketones, POC UA: NEGATIVE mg/dL
Nitrite, UA: NEGATIVE
Spec Grav, UA: >=1.03 — AB (ref 1.010–1.025)
Urobilinogen, UA: 1 E.U./dL
pH, UA: 6 (ref 5.0–8.0)

## 2023-05-20 LAB — POCT UA - MICROSCOPIC ONLY: Epithelial cells, urine per micros: >20

## 2023-05-20 MED ORDER — CEFADROXIL 500 MG PO CAPS: 500.0000 mg | ORAL_CAPSULE | Freq: Two times a day (BID) | ORAL | 0 refills | Status: AC

## 2023-05-20 MED ORDER — CEFTRIAXONE SODIUM 1 G IJ SOLR
1.0000 g | Freq: Once | INTRAMUSCULAR | Status: AC
  Administered 2023-05-20: 1 g via INTRAMUSCULAR

## 2023-05-20 NOTE — Assessment & Plan Note (Addendum)
UA suggestive of UTI but concerns for development into pyelonephritis versus nephrolithiasis given fevers and hematuria. Looks clinically well upon exam. H/o recurrent UTIs, would benefit from urologic follow-up for additional evaluation. Low concern for STI given no recent sexual activity. Denies bowel changes but could consider abdominal pathology pending scan. -Stat CT renal stone study, will call patient with results and plan -CTX in clinic today and then start Cefadroxil 500 mg twice daily x 6 days tomorrow -Urine culture sent -Discussed ED return precautions such as no urine output and significant worsening of pain concerning for obstructive stone versus development and pyelonephritis

## 2023-05-20 NOTE — Telephone Encounter (Signed)
Attempted to call patient x 2 to discuss CT renal study results.  Left HIPAA compliant voicemail x 2.  CT showed acute uncomplicated diverticulitis of the sigmoid colon.  Negative for nephrolithiasis. Nonurgent at this time but reiterated ED return precautions discussed during visit. Will attempt to contact patient tomorrow to discuss further and schedule close follow-up in 2 days.  Elberta Fortis, DO
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
## 2023-05-20 NOTE — Patient Instructions (Addendum)
It was wonderful to see you today! Thank you for choosing [facility].   Please bring ALL of your medications with you to every visit.   Today we talked about:  I am concerned about a kidney stone, we ordered a STAT CT which was able to be scheduled today. If you cannot urinate or have significantly worsening pain please go to the hospital as a stone could block your urine flow. For your infection we are giving you a dose of antibiotic in the clinic today and I am sending you an oral antibiotic called Cefadroxil that you will take twice per day for the next 6 days starting tomorrow.  Please follow up in 1 week for symptom evaluation   We are checking some labs today. If they are abnormal, I will call you. If they are normal, I will send you a MyChart message (if it is active) or a letter in the mail. If you do not hear about your labs in the next 2 weeks, please call the office.  Call the clinic at [phone] if your symptoms worsen or you have any concerns.  Please be sure to schedule follow up at the front desk before you leave today.   Elberta Fortis, DO Family Medicine

## 2023-05-21 ENCOUNTER — Telehealth: Payer: Self-pay | Admitting: Family Medicine

## 2023-05-21 NOTE — Telephone Encounter (Signed)
Spoke with patient regarding CT renal study results. CT shows acute uncomplicated diverticulitis.  Patient reports she is feeling more pain this morning, particularly in her right side. Not currently taking anything for pain, recommended alternating Tylenol and Ibuprofen throughout the day. Denies worsening fever and has been able to maintain hydration status. Denies bowel changes. Reiterated ED return precautions, patient voiced understanding. Scheduled appointment in [facility] at 1:30 PM tomorrow for further evaluation.  Elberta Fortis, DO

## 2023-05-22 ENCOUNTER — Ambulatory Visit (INDEPENDENT_AMBULATORY_CARE_PROVIDER_SITE_OTHER): Payer: Self-pay | Admitting: Student

## 2023-05-22 VITALS — BP 140/80 | HR 73 | Temp 97.7°F | Ht 68.0 in | Wt 217.2 lb

## 2023-05-22 DIAGNOSIS — N12 Tubulo-interstitial nephritis, not specified as acute or chronic: Secondary | ICD-10-CM

## 2023-05-22 DIAGNOSIS — I10 Essential (primary) hypertension: Secondary | ICD-10-CM

## 2023-05-22 HISTORY — DX: Tubulo-interstitial nephritis, not specified as acute or chronic: N12

## 2023-05-22 MED ORDER — CIPROFLOXACIN HCL 500 MG PO TABS
500.0000 mg | ORAL_TABLET | Freq: Two times a day (BID) | ORAL | 0 refills | Status: AC
Start: 2023-05-22 — End: 2023-05-29

## 2023-05-22 MED ORDER — HYDROCHLOROTHIAZIDE 25 MG PO TABS
25.0000 mg | ORAL_TABLET | Freq: Every morning | ORAL | 0 refills | Status: DC
Start: 2023-05-22 — End: 2023-10-08

## 2023-05-22 MED ORDER — CIPROFLOXACIN HCL 500 MG PO TABS: 500.0000 mg | ORAL_TABLET | Freq: Two times a day (BID) | ORAL | 0 refills | Status: DC

## 2023-05-22 NOTE — Assessment & Plan Note (Addendum)
BP elevated x2 suspect this to be due to non adherence as patient reports being out of meds for months. Provided money from [place] from the clinic fund. -Refilled home  hydrochlorothiazide -Follow up in 2 weeks to reassess BP and Pyelonephritis.

## 2023-05-22 NOTE — Patient Instructions (Addendum)
    Good to see you today.  Your symptoms are more consistent with pyelonephritis which is an infection of your kidney.  I have sent in prescription for ciprofloxacin which you will take 2 times daily for 7 days.  I have also given you $20 to our clinics assistant forms to help form the antibiotics.  Your blood pressure today is elevated.  Please make sure you take the HCTZ it will cost about $4 at your pharmacy.  It  Please follow-up with [place] in 1 week.

## 2023-05-22 NOTE — Assessment & Plan Note (Signed)
Patient with recent UTI with incomplete treatment presenting subjective fever, dysuria and right CVA tenderness is highly suspicious of pyelonephritis. Will treat with antibiotics. Patient is currently without insurance as just started a new job and can't afford the medication. Discussed with office manager Shanda Bumps who gave patient $20 to cover medication from the clinic's assistance fund. -Rx Cipro 500mg  for 7 days -Recommend tylenol/ Ibuprofen for pain. -Shanda Bumps provided patient with $20 from clinic assistance fund -Return precautions discussed

## 2023-05-22 NOTE — Progress Notes (Signed)
    SUBJECTIVE:   CHIEF COMPLAINT / HPI:   Patient is a 57 year old female with PMH CAD HTN presenting for follow-up. Was recently seen in the ED and diagnosed with acute diverticulitis seen on abdominal CT she has been doing Tylenol and ibuprofen for pain.  Still endorses Right side abdominal pain and tender to touch Some dysuria and and increased frequency.  No fevers or chills but endorses feeling hot at night which she deny to be postmenopausal symptoms. Currently on cefadroxil for UTI infection which patient reports she has completed high-dose at this time.  BP elevated  Home BP medication include losartan 100 mg daily and HCTZ 25 mg daily. Patient endorses noncompliance due to cost of medication.  BP today elevated to 140/100  PERTINENT  PMH / PSH: Reviewed   OBJECTIVE:   BP (!) 140/80   Pulse 73   Temp 97.7 F (36.5 C)   Ht 5\' 8"  (1.727 m)   Wt 217 lb 3.2 oz (98.5 kg)   LMP 10/06/2013   SpO2 94%   BMI 33.03 kg/m    Physical Exam General: Alert, well appearing, NAD, Oriented x4 Cardiovascular: RRR, No Murmurs, Normal S2/S2 Respiratory: CTAB, No wheezing or Rales Abdomen: No distension or tenderness Extremities: No edema on extremities   Skin: Warm and dry  ASSESSMENT/PLAN:   Essential hypertension, benign BP elevated x2 suspect this to be due to non adherence as patient reports being out of meds for months. Provided money from [place] from the clinic fund. -Refilled home  hydrochlorothiazide -Follow up in 2 weeks to reassess BP and Pyelonephritis.  Pyelonephritis Patient with recent UTI with incomplete treatment presenting subjective fever, dysuria and right CVA tenderness is highly suspicious of pyelonephritis. Will treat with antibiotics. Patient is currently without insurance as just started a new job and can't afford the medication. Discussed with office manager Shanda Bumps who gave patient $20 to cover medication from the clinic's assistance fund. -Rx Cipro  500mg  for 7 days -Recommend tylenol/ Ibuprofen for pain. Shanda Bumps provided patient with $20 from clinic assistance fund -Return precautions discussed   Jerre Simon, MD [facility]

## 2023-05-29 ENCOUNTER — Other Ambulatory Visit: Payer: Self-pay | Admitting: Student

## 2023-05-29 DIAGNOSIS — I10 Essential (primary) hypertension: Secondary | ICD-10-CM

## 2023-06-11 ENCOUNTER — Ambulatory Visit: Payer: Self-pay | Admitting: Family Medicine

## 2023-08-18 ENCOUNTER — Ambulatory Visit: Payer: Self-pay

## 2023-08-18 NOTE — Progress Notes (Deleted)
  SUBJECTIVE:   CHIEF COMPLAINT / HPI:   ***  PERTINENT  PMH / PSH: HTN, CAD, HLD  OBJECTIVE:  LMP 10/06/2013  Physical Exam   ASSESSMENT/PLAN:   Assessment & Plan  No follow-ups on file. Shelby Mattocks, DO 08/18/2023, 8:19 AM PGY-3, [facility] {    This will disappear when note is signed, click to select method of visit    :1}

## 2023-08-19 ENCOUNTER — Ambulatory Visit: Payer: Self-pay
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
## 2023-09-02 ENCOUNTER — Other Ambulatory Visit: Payer: Self-pay | Admitting: Family Medicine

## 2023-09-02 DIAGNOSIS — F411 Generalized anxiety disorder: Secondary | ICD-10-CM

## 2023-10-08 ENCOUNTER — Ambulatory Visit: Payer: Self-pay | Admitting: Student

## 2023-10-08 ENCOUNTER — Other Ambulatory Visit: Payer: Self-pay | Admitting: Student

## 2023-10-08 ENCOUNTER — Encounter: Payer: Self-pay | Admitting: Student

## 2023-10-08 VITALS — BP 166/84 | HR 89 | Ht 68.0 in | Wt 214.0 lb

## 2023-10-08 DIAGNOSIS — F411 Generalized anxiety disorder: Secondary | ICD-10-CM

## 2023-10-08 DIAGNOSIS — R399 Unspecified symptoms and signs involving the genitourinary system: Secondary | ICD-10-CM

## 2023-10-08 DIAGNOSIS — I10 Essential (primary) hypertension: Secondary | ICD-10-CM | POA: Diagnosis not present

## 2023-10-08 DIAGNOSIS — R002 Palpitations: Secondary | ICD-10-CM

## 2023-10-08 DIAGNOSIS — F419 Anxiety disorder, unspecified: Secondary | ICD-10-CM

## 2023-10-08 LAB — POCT URINALYSIS DIP (MANUAL ENTRY)
Bilirubin, UA: NEGATIVE
Glucose, UA: NEGATIVE mg/dL
Ketones, POC UA: NEGATIVE mg/dL
Nitrite, UA: NEGATIVE
Protein Ur, POC: 30 mg/dL — AB
Spec Grav, UA: >=1.03 — AB (ref 1.010–1.025)
Urobilinogen, UA: 0.2 U/dL
pH, UA: 5 (ref 5.0–8.0)

## 2023-10-08 MED ORDER — HYDROCHLOROTHIAZIDE 25 MG PO TABS
25.0000 mg | ORAL_TABLET | Freq: Every morning | ORAL | 0 refills | Status: DC
Start: 2023-10-08 — End: 2024-03-28

## 2023-10-08 MED ORDER — LOSARTAN POTASSIUM 100 MG PO TABS
100.0000 mg | ORAL_TABLET | Freq: Every day | ORAL | 0 refills | Status: DC
Start: 2023-10-08 — End: 2024-03-28

## 2023-10-08 MED ORDER — METOPROLOL SUCCINATE ER 25 MG PO TB24
25.0000 mg | ORAL_TABLET | Freq: Every day | ORAL | 0 refills | Status: DC
Start: 2023-10-08 — End: 2024-03-28

## 2023-10-08 MED ORDER — BUSPIRONE HCL 7.5 MG PO TABS
7.5000 mg | ORAL_TABLET | Freq: Three times a day (TID) | ORAL | 2 refills | Status: DC
Start: 2023-10-08 — End: 2023-10-12

## 2023-10-08 NOTE — Assessment & Plan Note (Signed)
Is obviously anxious today, and with good reason. Happy to get her back on anything that will help. Sounds like BusPar has worked quite well in the past. Therefore will go back to that.  - BusPar 7.5mg  TID

## 2023-10-08 NOTE — Patient Instructions (Signed)
Ms. Battista,  I'm refilling your BusPar, I think you should take this every day for now given the stresses in your life. Three times a day every day is how this medication was designed to be taken.   For your bladder, I'm checking your urine. If it is positive, I'll send in some antibiotics. If it is negative, I think you might do well with some over-the-counter Pyridium (brand name Azo). Once things calm down in your life, it is probably worthwhile getting you back to urology.   I'm refilling your BP meds and checking your kidneys. I will MyChart or Call you with these results.   Eliezer Mccoy, MD
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
## 2023-10-08 NOTE — Assessment & Plan Note (Signed)
BP elevated x2 today, but notes she has not had her medications today.  - Refilled medications today, PCP follow-up in a few weeks to check in  - BMP today

## 2023-10-08 NOTE — Assessment & Plan Note (Signed)
Given history of frequent culture-negative presentations, will hold off on abx for now. I am sending a culture and will treat if it comes up positive. 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 Has been plugged in with urology in the past. Now is not a great time for her to try and re-connect with them due to so much being up in the air as above. Reasonable to trial OTC pyridium to see if that helps. - Trial of pyridium - follow up culture, treat as appropriate - Urology follow-up if fails to improve and culture is negative

## 2023-10-09 LAB — BASIC METABOLIC PANEL
BUN/Creatinine Ratio: 23 (ref 9–23)
BUN: 20 mg/dL (ref 6–24)
CO2: 25 mmol/L (ref 20–29)
Calcium: 10.5 mg/dL — ABNORMAL HIGH (ref 8.7–10.2)
Chloride: 101 mmol/L (ref 96–106)
Creatinine, Ser: 0.86 mg/dL (ref 0.57–1.00)
Glucose: 200 mg/dL — ABNORMAL HIGH (ref 70–99)
Potassium: 4.1 mmol/L (ref 3.5–5.2)
Sodium: 143 mmol/L (ref 134–144)
eGFR: 79 mL/min/{1.73_m2} (ref >59)

## 2023-10-11 LAB — URINE CULTURE

## 2023-10-12 ENCOUNTER — Encounter: Payer: Self-pay | Admitting: Student

## 2023-10-12 ENCOUNTER — Other Ambulatory Visit: Payer: Self-pay | Admitting: Student

## 2023-10-12 DIAGNOSIS — F411 Generalized anxiety disorder: Secondary | ICD-10-CM

## 2023-10-12 NOTE — Addendum Note (Signed)
Addended by: Darnelle Spangle B on: 10/12/2023 12:22 PM   Modules accepted: Orders

## 2023-11-18 ENCOUNTER — Telehealth: Payer: BC Managed Care – PPO | Admitting: Physician Assistant

## 2023-11-18 DIAGNOSIS — J020 Streptococcal pharyngitis: Secondary | ICD-10-CM | POA: Diagnosis not present

## 2023-11-18 DIAGNOSIS — R3989 Other symptoms and signs involving the genitourinary system: Secondary | ICD-10-CM

## 2023-11-18 MED ORDER — AMOXICILLIN-POT CLAVULANATE 875-125 MG PO TABS: 1.0000 | ORAL_TABLET | Freq: Two times a day (BID) | ORAL | 0 refills | Status: DC
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
## 2023-11-18 NOTE — Patient Instructions (Signed)
 Juleen China, thank you for joining Margaretann Loveless, PA-C for today's virtual visit.  While this provider is not your primary care provider (PCP), if your PCP is located in our provider database this encounter information will be shared with them immediately following your visit.   A [facility] MyChart account gives you access to today's visit and all your visits, tests, and labs performed at [facility] " click here if you don't have a [facility] MyChart account or go to [email or web address]  Consent: (Patient) [name] provided verbal consent for this virtual visit at the beginning of the encounter.  Current Medications:  Current Outpatient Medications:    amoxicillin-clavulanate (AUGMENTIN) 875-125 MG tablet, Take 1 tablet by mouth 2 (two) times daily., Disp: 20 tablet, Rfl: 0   albuterol (VENTOLIN HFA) 108 (90 Base) MCG/ACT inhaler, Inhale 1-2 puffs into the lungs every 6 (six) hours as needed for wheezing or shortness of breath., Disp: 6.7 g, Rfl: 1   aspirin EC 81 MG tablet, Take 81 mg by mouth daily., Disp: , Rfl:    busPIRone (BUSPAR) 15 MG tablet, Take 0.5 tablets (7.5 mg total) by mouth 3 (three) times daily., Disp: 45 tablet, Rfl: 2   erythromycin ophthalmic ointment, Place 1 Application into the left eye 4 (four) times daily. For 5 days (Patient not taking: Reported on 02/10/2023), Disp: 7 g, Rfl: 0   fluticasone (FLONASE) 50 MCG/ACT nasal spray, Place 2 sprays into both nostrils daily. (Patient not taking: Reported on 02/10/2023), Disp: 16 g, Rfl: 6   hydrochlorothiazide (HYDRODIURIL) 25 MG tablet, Take 1 tablet (25 mg total) by mouth every morning., Disp: 90 tablet, Rfl: 0   losartan (COZAAR) 100 MG tablet, Take 1 tablet (100 mg total) by mouth daily., Disp: 90 tablet, Rfl: 0   metoprolol succinate (TOPROL-XL) 25 MG 24 hr tablet, Take 1 tablet (25 mg total) by mouth daily., Disp: 90 tablet, Rfl: 0   nitroGLYCERIN (NITROSTAT) 0.4 MG SL  tablet, Place 1 tablet (0.4 mg total) under the tongue every 5 (five) minutes as needed for chest pain. If you need to take >3, please go to the ED. (Patient not taking: Reported on 02/10/2023), Disp: 10 tablet, Rfl: 0   rosuvastatin (CRESTOR) 20 MG tablet, Take 1 tablet (20 mg total) by mouth daily. (Patient not taking: Reported on 02/10/2023), Disp: 90 tablet, Rfl: 3   Medications ordered in this encounter:  Meds ordered this encounter  Medications   amoxicillin-clavulanate (AUGMENTIN) 875-125 MG tablet    Sig: Take 1 tablet by mouth 2 (two) times daily.    Dispense:  20 tablet    Refill:  0    Supervising Provider:   Merrilee Jansky [[id number]]     *If you need refills on other medications prior to your next appointment, please contact your pharmacy*  Follow-Up: Call back or seek an in-person evaluation if the symptoms worsen or if the condition fails to improve as anticipated.  [facility] [phone]  Other Instructions  Strep Throat, Adult Strep throat is an infection in the throat that is caused by bacteria. It is common during the cold months of the year. It mostly affects children who are 65-88 years old. However, people of all ages can get it at any time of the year. This infection spreads from person to person (is contagious) through coughing, sneezing, or having close contact. Your health care provider may use other names to describe the infection. When strep throat affects  the tonsils, it is called tonsillitis. When it affects the back of the throat, it is called pharyngitis. What are the causes? This condition is caused by the Streptococcus pyogenes bacteria. What increases the risk? You are more likely to develop this condition if: You care for school-age children, or are around school-age children. Children are more likely to get strep throat and may spread it to others. You spend time in crowded places where the infection can spread easily. You have close  contact with someone who has strep throat. What are the signs or symptoms? Symptoms of this condition include: Fever or chills. Redness, swelling, or pain in the tonsils or throat. Pain or difficulty when swallowing. White or yellow spots on the tonsils or throat. Tender glands in the neck and under the jaw. Bad smelling breath. Red rash all over the body. This is rare. How is this diagnosed? This condition is diagnosed by tests that check for the presence and the amount of bacteria that cause strep throat. They are: Rapid strep test. Your throat is swabbed and checked for the presence of bacteria. Results are usually ready in minutes. Throat culture test. Your throat is swabbed. The sample is placed in a cup that allows infections to grow. Results are usually ready in 1 or 2 days. How is this treated? This condition may be treated with: Medicines that kill germs (antibiotics). Medicines that relieve pain or fever. These include: Ibuprofen or acetaminophen. Aspirin, only for people who are over the age of 39. Throat lozenges. Throat sprays. Follow these instructions at home: Medicines  Take over-the-counter and prescription medicines only as told by your health care provider. Take your antibiotic medicine as told by your health care provider. Do not stop taking the antibiotic even if you start to feel better. Eating and drinking  If you have trouble swallowing, try eating soft foods until your sore throat feels better. Drink enough fluid to keep your urine pale yellow. To help relieve pain, you may have: Warm fluids, such as soup and tea. Cold fluids, such as frozen desserts or popsicles. General instructions Gargle with a salt-water mixture 3-4 times a day or as needed. To make a salt-water mixture, completely dissolve -1 tsp (3-6 g) of salt in 1 cup (237 mL) of warm water. Get plenty of rest. Stay home from work or school until you have been taking antibiotics for 24  hours. Do not use any products that contain nicotine or tobacco. These products include cigarettes, chewing tobacco, and vaping devices, such as e-cigarettes. If you need help quitting, ask your health care provider. It is up to you to get your test results. Ask your health care provider, or the department that is doing the test, when your results will be ready. Keep all follow-up visits. This is important. How is this prevented?  Do not share food, drinking cups, or personal items that could cause the infection to spread to other people. Wash your hands often with soap and water for at least 20 seconds. If soap and water are not available, use hand sanitizer. Make sure that all people in your house wash their hands well. Have family members tested if they have a sore throat or fever. They may need an antibiotic if they have strep throat. Contact a health care provider if: You have swelling in your neck that keeps getting bigger. You develop a rash, cough, or earache. You cough up a thick mucus that is green, yellow-brown, or bloody. You have pain  or discomfort that does not get better with medicine. Your symptoms seem to be getting worse. You have a fever. Get help right away if: You have new symptoms, such as vomiting, severe headache, stiff or painful neck, chest pain, or shortness of breath. You have severe throat pain, drooling, or changes in your voice. You have swelling of the neck, or the skin on the neck becomes red and tender. You have signs of dehydration, such as tiredness (fatigue), dry mouth, and decreased urination. You become increasingly sleepy, or you cannot wake up completely. Your joints become red or painful. These symptoms may represent a serious problem that is an emergency. Do not wait to see if the symptoms will go away. Get medical help right away. Call your local emergency services (911 in the [place] Do not drive yourself to the hospital. Summary Strep throat is an  infection in the throat that is caused by the Streptococcus pyogenes bacteria. This infection is spread from person to person (is contagious) through coughing, sneezing, or having close contact. Take your medicines, including antibiotics, as told by your health care provider. Do not stop taking the antibiotic even if you start to feel better. To prevent the spread of germs, wash your hands well with soap and water. Have others do the same. Do not share food, drinking cups, or personal items. Get help right away if you have new symptoms, such as vomiting, severe headache, stiff or painful neck, chest pain, or shortness of breath. This information is not intended to replace advice given to you by your health care provider. Make sure you discuss any questions you have with your health care provider. Document Revised: 01/01/2021 Document Reviewed: 01/01/2021 Elsevier Patient Education  2024 Elsevier Inc.   Urinary Tract Infection, Female A urinary tract infection (UTI) is an infection in your urinary tract. The urinary tract is made up of organs that make, store, and get rid of pee (urine) in your body. These organs include: The kidneys. The ureters. The bladder. The urethra. What are the causes? Most UTIs are caused by germs called bacteria. They may be in or near your genitals. These germs grow and cause swelling in your urinary tract. What increases the risk? You're more likely to get a UTI if: You're a female. The urethra is shorter in females than in males. You have a soft tube called a catheter that drains your pee. You can't control when you pee or poop. You have trouble peeing because of: A kidney stone. A urinary blockage. A nerve condition that affects your bladder. Not getting enough to drink. You're sexually active. You use a birth control inside your vagina, like spermicide. You're pregnant. You have low levels of the hormone estrogen in your body. You're an older  adult. You're also more likely to get a UTI if you have other health problems. These may include: Diabetes. A weak immune system. Your immune system is your body's defense system. Sickle cell disease. Injury of the spine. What are the signs or symptoms? Symptoms may include: Needing to pee right away. Peeing small amounts often. Pain or burning when you pee. Blood in your pee. Pee that smells bad or odd. Pain in your belly or lower back. You may also: Feel confused. This may be the first symptom in older adults. Vomit. Not feel hungry. Feel tired or easily annoyed. Have a fever or chills. How is this diagnosed? A UTI is diagnosed based on your medical history and an exam. You may  also have other tests. These may include: Pee tests. Blood tests. Tests for sexually transmitted infections (STIs). If you've had more than one UTI, you may need to have imaging studies done to find out why you keep getting them. How is this treated? A UTI can be treated by: Taking antibiotics or other medicines. Drinking enough fluid to keep your pee pale yellow. In rare cases, a UTI can cause a very bad condition called sepsis. Sepsis may be treated in the hospital. Follow these instructions at home: Medicines Take your medicines only as told by your health care provider. If you were given antibiotics, take them as told by your provider. Do not stop taking them even if you start to feel better. General instructions Make sure you: Pee often and fully. Do not hold your pee for a long time. Wipe from front to back after you pee or poop. Use each tissue only once when you wipe. Pee after you have sex. Do not douche or use sprays or powders in your genital area. Contact a health care provider if: Your symptoms don't get better after 1-2 days of taking antibiotics. Your symptoms go away and then come back. You have a fever or chills. You vomit or feel like you may vomit. Get help right away if: You  have very bad pain in your back or lower belly. You faint. This information is not intended to replace advice given to you by your health care provider. Make sure you discuss any questions you have with your health care provider. Document Revised: 04/16/2023 Document Reviewed: 12/12/2022 Elsevier Patient Education  2024 Elsevier Inc.   If you have been instructed to have an in-person evaluation today at a local Urgent Care facility, please use the link below. It will take you to a list of all of our available [facility], including address, phone number and hours of operation. Please do not delay care.  [facility]  If you or a family member do not have a primary care provider, use the link below to schedule a visit and establish care. When you choose a [facility] primary care physician or advanced practice provider, you gain a long-term partner in health. Find a Primary Care Provider  Learn more about [facility]'s in-office and virtual care options: [facility] - Get Care Now

## 2023-11-19 ENCOUNTER — Ambulatory Visit: Payer: BC Managed Care – PPO

## 2023-11-26 ENCOUNTER — Encounter: Payer: Self-pay | Admitting: Family Medicine

## 2023-11-26 ENCOUNTER — Ambulatory Visit: Admitting: Family Medicine

## 2023-11-26 ENCOUNTER — Other Ambulatory Visit (HOSPITAL_COMMUNITY)
Admission: RE | Admit: 2023-11-26 | Discharge: 2023-11-26 | Disposition: A | Discharge status: Home / Self Care | Source: Ambulatory Visit | Attending: Family Medicine | Admitting: Family Medicine

## 2023-11-26 VITALS — BP 170/84 | Wt 213.2 lb

## 2023-11-26 DIAGNOSIS — B9689 Other specified bacterial agents as the cause of diseases classified elsewhere: Secondary | ICD-10-CM | POA: Diagnosis not present

## 2023-11-26 DIAGNOSIS — N760 Acute vaginitis: Secondary | ICD-10-CM | POA: Diagnosis not present

## 2023-11-26 DIAGNOSIS — N898 Other specified noninflammatory disorders of vagina: Secondary | ICD-10-CM

## 2023-11-26 DIAGNOSIS — Z113 Encounter for screening for infections with a predominantly sexual mode of transmission: Secondary | ICD-10-CM | POA: Insufficient documentation

## 2023-11-26 DIAGNOSIS — B3731 Acute candidiasis of vulva and vagina: Secondary | ICD-10-CM | POA: Insufficient documentation

## 2023-11-26 MED ORDER — FLUCONAZOLE 150 MG PO TABS: 150.0000 mg | ORAL_TABLET | Freq: Once | ORAL | 0 refills | Status: AC

## 2023-11-26 NOTE — Patient Instructions (Addendum)
 It was great to see you today! Thank you for choosing [facility] for your primary care. [name] was seen for vaginal itching and STD screening.  Today we addressed: STD Screening/Vaginal Itching Advised patient to practice safe sex and use condoms. Swab performed today. Diflucan prescribed take one tablet if your symptoms (vaginal itching) doesn't improve within 3 days, take another tablet.   You should return to our clinic No follow-ups on file. Please arrive 15 minutes before your appointment to ensure smooth check in process.  We appreciate your efforts in making this happen.  Thank you for allowing me to participate in your care, Fortunato Curling, DO 11/26/2023, 4:38 PM PGY-1, [facility]
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
## 2023-11-30 ENCOUNTER — Encounter: Payer: Self-pay | Admitting: Family Medicine

## 2023-11-30 LAB — CERVICOVAGINAL ANCILLARY ONLY
Bacterial Vaginitis (gardnerella): POSITIVE — AB
Candida Glabrata: NEGATIVE
Candida Vaginitis: POSITIVE — AB
Chlamydia: NEGATIVE
Comment: NEGATIVE
Comment: NEGATIVE
Comment: NEGATIVE
Comment: NEGATIVE
Comment: NEGATIVE
Comment: NORMAL
Neisseria Gonorrhea: NEGATIVE
Trichomonas: NEGATIVE

## 2023-11-30 MED ORDER — METRONIDAZOLE 500 MG PO TABS: 500.0000 mg | ORAL_TABLET | Freq: Two times a day (BID) | ORAL | 0 refills | Status: AC

## 2023-11-30 NOTE — Addendum Note (Signed)
 Addended by: Fortunato Curling on: 11/30/2023 09:48 PM   Modules accepted: Orders

## 2024-03-01 ENCOUNTER — Encounter: Payer: Self-pay | Admitting: *Deleted

## 2024-03-28 ENCOUNTER — Other Ambulatory Visit: Payer: Self-pay

## 2024-03-28 DIAGNOSIS — R002 Palpitations: Secondary | ICD-10-CM

## 2024-03-28 DIAGNOSIS — I10 Essential (primary) hypertension: Secondary | ICD-10-CM

## 2024-03-28 MED ORDER — METOPROLOL SUCCINATE ER 25 MG PO TB24: 25.0000 mg | ORAL_TABLET | Freq: Every day | ORAL | 0 refills | Status: DC

## 2024-03-28 MED ORDER — HYDROCHLOROTHIAZIDE 25 MG PO TABS: 25.0000 mg | ORAL_TABLET | Freq: Every morning | ORAL | 0 refills | Status: DC

## 2024-03-28 MED ORDER — LOSARTAN POTASSIUM 100 MG PO TABS: 100.0000 mg | ORAL_TABLET | Freq: Every day | ORAL | 0 refills | Status: DC

## 2024-03-30 ENCOUNTER — Ambulatory Visit: Admitting: Family Medicine

## 2024-04-05 ENCOUNTER — Ambulatory Visit: Admitting: Family Medicine

## 2024-04-12 ENCOUNTER — Ambulatory Visit: Admitting: Family Medicine

## 2024-04-24 ENCOUNTER — Emergency Department (HOSPITAL_COMMUNITY)

## 2024-04-24 ENCOUNTER — Emergency Department (HOSPITAL_COMMUNITY)
Admission: EM | Admit: 2024-04-24 | Discharge: 2024-04-24 | Disposition: A | Discharge status: Home / Self Care | Attending: Emergency Medicine | Admitting: Emergency Medicine

## 2024-04-24 ENCOUNTER — Other Ambulatory Visit: Payer: Self-pay

## 2024-04-24 ENCOUNTER — Encounter (HOSPITAL_COMMUNITY): Payer: Self-pay

## 2024-04-24 DIAGNOSIS — K5730 Diverticulosis of large intestine without perforation or abscess without bleeding: Secondary | ICD-10-CM | POA: Diagnosis not present

## 2024-04-24 DIAGNOSIS — I10 Essential (primary) hypertension: Secondary | ICD-10-CM | POA: Diagnosis not present

## 2024-04-24 DIAGNOSIS — R1030 Lower abdominal pain, unspecified: Secondary | ICD-10-CM | POA: Diagnosis not present

## 2024-04-24 DIAGNOSIS — Z7982 Long term (current) use of aspirin: Secondary | ICD-10-CM | POA: Diagnosis not present

## 2024-04-24 DIAGNOSIS — N83202 Unspecified ovarian cyst, left side: Secondary | ICD-10-CM | POA: Diagnosis not present

## 2024-04-24 DIAGNOSIS — Z91040 Latex allergy status: Secondary | ICD-10-CM | POA: Insufficient documentation

## 2024-04-24 DIAGNOSIS — N3090 Cystitis, unspecified without hematuria: Secondary | ICD-10-CM | POA: Diagnosis not present

## 2024-04-24 DIAGNOSIS — K449 Diaphragmatic hernia without obstruction or gangrene: Secondary | ICD-10-CM | POA: Diagnosis not present

## 2024-04-24 DIAGNOSIS — R109 Unspecified abdominal pain: Secondary | ICD-10-CM | POA: Diagnosis not present

## 2024-04-24 LAB — URINALYSIS, ROUTINE W REFLEX MICROSCOPIC
Bacteria, UA: NONE SEEN
Bilirubin Urine: NEGATIVE
Glucose, UA: NEGATIVE mg/dL
Hgb urine dipstick: NEGATIVE
Ketones, ur: NEGATIVE mg/dL
Nitrite: NEGATIVE
Protein, ur: NEGATIVE mg/dL
Specific Gravity, Urine: >1.046 — ABNORMAL HIGH (ref 1.005–1.030)
pH: 6 (ref 5.0–8.0)

## 2024-04-24 LAB — COMPREHENSIVE METABOLIC PANEL WITH GFR
ALT: 16 U/L (ref 0–44)
AST: 17 U/L (ref 15–41)
Albumin: 3.6 g/dL (ref 3.5–5.0)
Alkaline Phosphatase: 70 U/L (ref 38–126)
Anion gap: 11 (ref 5–15)
BUN: 13 mg/dL (ref 6–20)
CO2: 23 mmol/L (ref 22–32)
Calcium: 9.5 mg/dL (ref 8.9–10.3)
Chloride: 104 mmol/L (ref 98–111)
Creatinine, Ser: 0.69 mg/dL (ref 0.44–1.00)
GFR, Estimated: >60 mL/min (ref >60)
Glucose, Bld: 146 mg/dL — ABNORMAL HIGH (ref 70–99)
Potassium: 3.6 mmol/L (ref 3.5–5.1)
Sodium: 138 mmol/L (ref 135–145)
Total Bilirubin: 0.7 mg/dL (ref 0.0–1.2)
Total Protein: 6.9 g/dL (ref 6.5–8.1)

## 2024-04-24 LAB — CBC
HCT: 37.9 % (ref 36.0–46.0)
Hemoglobin: 12.2 g/dL (ref 12.0–15.0)
MCH: 28.5 pg (ref 26.0–34.0)
MCHC: 32.2 g/dL (ref 30.0–36.0)
MCV: 88.6 fL (ref 80.0–100.0)
Platelets: 296 K/uL (ref 150–400)
RBC: 4.28 MIL/uL (ref 3.87–5.11)
RDW: 13.2 % (ref 11.5–15.5)
WBC: 7.1 K/uL (ref 4.0–10.5)
nRBC: 0 % (ref 0.0–0.2)

## 2024-04-24 LAB — LIPASE, BLOOD: Lipase: 25 U/L (ref 11–51)

## 2024-04-24 MED ORDER — METOPROLOL SUCCINATE ER 25 MG PO TB24
25.0000 mg | ORAL_TABLET | Freq: Every day | ORAL | Status: DC
  Administered 2024-04-24: 25 mg via ORAL
  Filled 2024-04-24: qty 1

## 2024-04-24 MED ORDER — CEPHALEXIN 500 MG PO CAPS
500.0000 mg | ORAL_CAPSULE | Freq: Once | ORAL | Status: AC
  Administered 2024-04-24: 500 mg via ORAL
  Filled 2024-04-24: qty 1

## 2024-04-24 MED ORDER — LOSARTAN POTASSIUM 25 MG PO TABS
100.0000 mg | ORAL_TABLET | Freq: Every day | ORAL | Status: DC
  Administered 2024-04-24: 100 mg via ORAL
  Filled 2024-04-24: qty 4

## 2024-04-24 MED ORDER — CEPHALEXIN 500 MG PO CAPS: 500.0000 mg | ORAL_CAPSULE | Freq: Three times a day (TID) | ORAL | 0 refills | Status: AC

## 2024-04-24 MED ORDER — ONDANSETRON HCL 4 MG/2ML IJ SOLN
4.0000 mg | Freq: Once | INTRAMUSCULAR | Status: AC
  Administered 2024-04-24: 4 mg via INTRAVENOUS
  Filled 2024-04-24: qty 2

## 2024-04-24 MED ORDER — SODIUM CHLORIDE 0.9 % IV SOLN: INTRAVENOUS | Status: DC

## 2024-04-24 MED ORDER — HYDROCHLOROTHIAZIDE 25 MG PO TABS
25.0000 mg | ORAL_TABLET | Freq: Every morning | ORAL | Status: DC
  Administered 2024-04-24: 25 mg via ORAL
  Filled 2024-04-24: qty 1

## 2024-04-24 MED ORDER — IOHEXOL 300 MG/ML  SOLN
100.0000 mL | Freq: Once | INTRAMUSCULAR | Status: AC | PRN
  Administered 2024-04-24: 100 mL via INTRAVENOUS

## 2024-04-24 MED ORDER — KETOROLAC TROMETHAMINE 30 MG/ML IJ SOLN
30.0000 mg | Freq: Once | INTRAMUSCULAR | Status: AC
  Administered 2024-04-24: 30 mg via INTRAVENOUS
  Filled 2024-04-24: qty 1

## 2024-04-24 NOTE — ED Provider Notes (Signed)
 [place] EMERGENCY DEPARTMENT AT [facility] Provider Note   CSN: [id number] Arrival date & time: 04/24/24  1441     Patient presents with: Abdominal Pain   [name] is a 58 y.o. female.    Abdominal Pain  This patient is a 59 year old female no prior abdominal surgical history, she suffers with hypertension and high cholesterol, takes metoprolol  losartan  hydrochlorothiazide  but reports she has not had this in several days.  Over that same timeframe she has developed bilateral flank pain radiating to the lower abdomen associated with nausea and subjective fevers though she has not had any objective fevers.  She reports she gets 3 or 4 urinary tract infections per year, she is followed at the family practice with [facility].  She has not recently been on antibiotics since May    Prior to Admission medications   Medication Sig Start Date End Date Taking? Authorizing Provider  albuterol  (VENTOLIN  HFA) 108 (90 Base) MCG/ACT inhaler Inhale 1-2 puffs into the lungs every 6 (six) hours as needed for wheezing or shortness of breath. 10/19/20   Patel, Poonamkumari J, MD  amoxicillin -clavulanate (AUGMENTIN ) 875-125 MG tablet Take 1 tablet by mouth 2 (two) times daily. 11/18/23   Vivienne Delon HERO, PA-C  aspirin  EC 81 MG tablet Take 81 mg by mouth daily.    [provider]  busPIRone  (BUSPAR ) 15 MG tablet Take 0.5 tablets (7.5 mg total) by mouth 3 (three) times daily. 10/13/23   Marlee Lynwood NOVAK, MD  hydrochlorothiazide  (HYDRODIURIL ) 25 MG tablet Take 1 tablet (25 mg total) by mouth every morning. 03/28/24   Nicholas Bar, MD  losartan  (COZAAR ) 100 MG tablet Take 1 tablet (100 mg total) by mouth daily. 03/28/24   Nicholas Bar, MD  metoprolol  succinate (TOPROL -XL) 25 MG 24 hr tablet Take 1 tablet (25 mg total) by mouth daily. 03/28/24   Nicholas Bar, MD  nitroGLYCERIN  (NITROSTAT ) 0.4 MG SL tablet Place 1 tablet (0.4 mg total) under the tongue every 5 (five)  minutes as needed for chest pain. If you need to take >3, please go to the ED. Patient not taking: Reported on 02/10/2023 07/19/21   Austin Ade, MD  rosuvastatin  (CRESTOR ) 20 MG tablet Take 1 tablet (20 mg total) by mouth daily. Patient not taking: Reported on 02/10/2023 03/17/22   Simmons-Robinson, Rockie, MD    Allergies: Latex, Other, Adhesive [tape], and Septra [bactrim]    Review of Systems  Gastrointestinal:  Positive for abdominal pain.  All other systems reviewed and are negative.   Updated Vital Signs BP (!) 192/106 (BP Location: Right Wrist)   Pulse 74   Temp (!) 97.5 F (36.4 C) (Oral)   Resp 18   Ht 1.727 m (5' 8)   Wt 97.1 kg   LMP 10/06/2013   SpO2 95%   BMI 32.54 kg/m   Physical Exam Vitals and nursing note reviewed.  Constitutional:      General: She is not in acute distress.    Appearance: She is well-developed.  HENT:     Head: Normocephalic and atraumatic.     Mouth/Throat:     Pharynx: No oropharyngeal exudate.  Eyes:     General: No scleral icterus.       Right eye: No discharge.        Left eye: No discharge.     Conjunctiva/sclera: Conjunctivae normal.     Pupils: Pupils are equal, round, and reactive to light.  Neck:     Thyroid : No  thyromegaly.     Vascular: No JVD.  Cardiovascular:     Rate and Rhythm: Normal rate and regular rhythm.     Heart sounds: Normal heart sounds. No murmur heard.    No friction rub. No gallop.  Pulmonary:     Effort: Pulmonary effort is normal. No respiratory distress.     Breath sounds: Normal breath sounds. No wheezing or rales.  Abdominal:     General: Bowel sounds are normal. There is no distension.     Palpations: Abdomen is soft. There is no mass.     Tenderness: There is abdominal tenderness.     Comments: Abdomen is tender to palpation across the lower abdomen, nonfocal, no upper abdominal tenderness, she is obese but extremely soft, there is mild CVA tenderness bilaterally  Musculoskeletal:         General: No tenderness. Normal range of motion.     Cervical back: Normal range of motion and neck supple.  Lymphadenopathy:     Cervical: No cervical adenopathy.  Skin:    General: Skin is warm and dry.     Findings: No erythema or rash.  Neurological:     Mental Status: She is alert.     Coordination: Coordination normal.  Psychiatric:        Behavior: Behavior normal.     (all labs ordered are listed, but only abnormal results are displayed) Labs Reviewed  LIPASE, BLOOD  COMPREHENSIVE METABOLIC PANEL WITH GFR  CBC  URINALYSIS, ROUTINE W REFLEX MICROSCOPIC    EKG: None  Radiology: No results found.   Procedures   Medications Ordered in the ED - No data to display                                  Medical Decision Making Amount and/or Complexity of Data Reviewed Labs: ordered. Radiology: ordered.  Risk Prescription drug management.    This patient presents to the ED for concern of bilateral abdominal and flank pain, this involves an extensive number of treatment options, and is a complaint that carries with it a high risk of complications and morbidity.  The differential diagnosis includes any stones, pyelonephritis, cystitis, other intra-abdominal pathology such as diverticulitis, there has been no vomiting or diarrhea Additionally the patient's blood pressure is 210/130 and she has been unmedicated for several days for her blood pressure.  She was counseled this at the bedside Will need CT scan labs and urine`   Co morbidities / Chronic conditions that complicate the patient evaluation  Frequent UTIs, uncontrolled hypertension   Additional history obtained:  Additional history obtained from EMR External records from outside source obtained and reviewed including medical record, office visits with your family doctor as recently as March, diagnosed with pyelonephritis in August 2024   Lab Tests:  I Ordered, and personally interpreted labs.  The  pertinent results include:  UA with likely UTI, CBC and met panel OK   Imaging Studies ordered:  I ordered imaging studies including CT abd  I independently visualized and interpreted imaging which showed no acute findings. I agree with the radiologist interpretation   Cardiac Monitoring: / EKG:  The patient was maintained on a cardiac monitor.  I personally viewed and interpreted the cardiac monitored which showed an underlying rhythm of: NSR, no tachycarida.   Problem List / ED Course / Critical interventions / Medication management  Hypertension - meds given, pt has meds at  home I ordered medication including keflex    Reevaluation of the patient after these medicines showed that the patient improved I have reviewed the patients home medicines and have made adjustments as needed    Test / Admission - Considered:  Considered admission - but pt looks wellk, no signs of sepsis      Final diagnoses:  Cystitis    ED Discharge Orders     None          Cleotilde Rogue, MD 04/24/24 1756

## 2024-04-24 NOTE — Discharge Instructions (Addendum)
 Keflex  3 times daily for 7 days Motrin  for pain CT scan was normal  ER for worsening symptoms.  Otherwise see your doctor in 3 days for recheck  [facility] Primary Care Doctor List    Rollene Pesa, MD. Specialty: [facility] Contact information: [address]  [place] [address]  [phone]   Glendia Fielding, MD. Specialty: [facility] Medicine Contact information: [address]  [place] [address]  [phone]   Benita Outhouse, MD Specialty: Internal Medicine Contact information: [address] [place] [address]  [phone]   Darlyn Hurst, MD. Specialty: Internal Medicine Contact information: [address]  [place] [address]  [phone]    [facility] (Dr. Luke) Specialty: Family Medicine Contact information: [address]  [place] [address]  [phone]   Garnette Lolling, MD. Specialty: [facility] Medicine Contact information: [address]  [address]  [place] [address]  [phone]   Gaither Langton, MD. Specialty: Internal Medicine Contact information: [address]  [address]  [place] [address]  [phone]   [facility] Family Medicine: [address] [phone]  [place], Family medicine [address]  [phone]  [facility] [address] [place], [place] [phone]  [facility]: [address] [phone]    [facility] Evaline Bernardino  [address] [place], [place] [address] [phone]  Services [facility] offers a variety of basic health services.  Services include but are not limited to: Blood pressure checks  Heart rate checks  Blood sugar checks  Urine analysis  Rapid strep tests  Pregnancy tests.  Health education and referrals  People needing more complex services will be directed to a physician online. Using these virtual visits, doctors can evaluate and  prescribe medicine and treatments. There will be no medication on-site, though [facility] will help patients fill their prescriptions at little to no cost.   For More information please go to: [email or web address]  Allergy and Asthma:    [address] [place] [phone]  Urology:  [address]  [place] [phone]  [facility]  [address] [place], [place] [phone]  Orthopedics   [address] [place], [place] [phone]  Endocrinology  [address] [place], [place] [phone]  Podiatry: [facility] [phone]

## 2024-04-24 NOTE — ED Triage Notes (Signed)
 Pt stated that she has been having abd pain that radiates to both sides for 3 days. Pt stated that she has been using the bathroom more often and that her bladder hurts

## 2024-04-26 LAB — URINE CULTURE: Culture: NO GROWTH

## 2024-04-27 ENCOUNTER — Ambulatory Visit (INDEPENDENT_AMBULATORY_CARE_PROVIDER_SITE_OTHER): Admitting: Family Medicine

## 2024-04-27 VITALS — BP 156/90 | Ht 65.0 in | Wt 218.0 lb

## 2024-04-27 DIAGNOSIS — R102 Pelvic and perineal pain: Secondary | ICD-10-CM | POA: Diagnosis not present

## 2024-04-27 DIAGNOSIS — R109 Unspecified abdominal pain: Secondary | ICD-10-CM

## 2024-04-27 MED ORDER — TIZANIDINE HCL 4 MG PO TABS: 4.0000 mg | ORAL_TABLET | Freq: Four times a day (QID) | ORAL | 0 refills | Status: AC | PRN
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
## 2024-04-27 NOTE — Assessment & Plan Note (Signed)
 Chronic, intermittent, while this may represent intermittent nephrolithiasis versus ureteral spasm versus prior pyelonephritis, with recent imaging recommend the ED is likely not an infectious process or kidney stone.  I suspect that a large part is secondary to lumbar paraspinal muscle spasm and tightness. - Trial tizanidine  4 mg every 6 hours as needed - Physical therapy referral for lumbar strengthening - Directed to not take Keflex  prescribed by the emergency department as urinary culture was negative

## 2024-04-27 NOTE — Patient Instructions (Addendum)
 It was great to see you! Thank you for allowing me to participate in your care!  Our plans for today:  - I have sent the muscle relaxant tizanidine  to your pharmacy, you can take this every 6 hours as needed to help with the pain in your flank/back. - I have also placed a PT referral to help with strengthening exercises for your back, they should call you to schedule in the next couple weeks - You should also receive a call from urology office in the next couple weeks to schedule an appointment for your bladder pain.   Please arrive 15 minutes PRIOR to your next scheduled appointment time! If you do not, this affects OTHER patients' care.  Take care and seek immediate care sooner if you develop any concerns.   Ozell Provencal, MD, PGY-3 [facility] Family Medicine 11:10 AM 04/27/2024  [facility] Family Medicine

## 2024-05-18 ENCOUNTER — Ambulatory Visit: Admitting: Family Medicine

## 2024-06-07 ENCOUNTER — Ambulatory Visit: Admitting: Family Medicine

## 2024-06-24 ENCOUNTER — Other Ambulatory Visit: Payer: Self-pay | Admitting: Family Medicine

## 2024-06-24 DIAGNOSIS — I10 Essential (primary) hypertension: Secondary | ICD-10-CM

## 2024-08-11 ENCOUNTER — Ambulatory Visit (INDEPENDENT_AMBULATORY_CARE_PROVIDER_SITE_OTHER): Admitting: Family Medicine

## 2024-08-11 ENCOUNTER — Encounter: Payer: Self-pay | Admitting: Family Medicine

## 2024-08-11 VITALS — BP 130/80 | HR 74 | Ht 65.0 in | Wt 216.6 lb

## 2024-08-11 DIAGNOSIS — R2232 Localized swelling, mass and lump, left upper limb: Secondary | ICD-10-CM | POA: Diagnosis not present

## 2024-08-11 DIAGNOSIS — R002 Palpitations: Secondary | ICD-10-CM | POA: Diagnosis not present

## 2024-08-11 DIAGNOSIS — Z1211 Encounter for screening for malignant neoplasm of colon: Secondary | ICD-10-CM

## 2024-08-11 DIAGNOSIS — I10 Essential (primary) hypertension: Secondary | ICD-10-CM | POA: Diagnosis not present

## 2024-08-11 DIAGNOSIS — Z1159 Encounter for screening for other viral diseases: Secondary | ICD-10-CM

## 2024-08-11 DIAGNOSIS — I5030 Unspecified diastolic (congestive) heart failure: Secondary | ICD-10-CM

## 2024-08-11 DIAGNOSIS — Z23 Encounter for immunization: Secondary | ICD-10-CM | POA: Diagnosis not present

## 2024-08-11 MED ORDER — METOPROLOL SUCCINATE ER 50 MG PO TB24: 50.0000 mg | ORAL_TABLET | Freq: Every day | ORAL | 0 refills | Status: AC

## 2024-08-11 MED ORDER — HYDROCHLOROTHIAZIDE 25 MG PO TABS: 25.0000 mg | ORAL_TABLET | Freq: Every morning | ORAL | 0 refills | Status: AC

## 2024-08-11 MED ORDER — LOSARTAN POTASSIUM 100 MG PO TABS: 100.0000 mg | ORAL_TABLET | Freq: Every day | ORAL | 0 refills | Status: AC

## 2024-08-11 NOTE — Patient Instructions (Addendum)
 It was wonderful to see you today.  Please bring ALL of your medications with you to every visit.   Today we talked about:  Blood pressure - Your blood pressure continues to be mildly high. I am going to increase your metoprolol  to 50 mg. This may help with your palpitations as well. You have upcoming appointments with Dr. Koval to check an ambulatory blood pressure.   For the palpitations I have referred you to cardiology. We will see if the metoprolol  helps and we are getting some labs today.   I have ordered the cologuard test to screen for colon cancer.   You need a mammogram to prevent breast cancer.  Please schedule an appointment.  You can call [phone].    Please follow up in 1 month  Thank you for choosing [facility].   Please call [phone] with any questions about today's appointment.  Please be sure to schedule follow up at the front desk before you leave today.   Areta Saliva, MD  Family Medicine

## 2024-08-11 NOTE — Progress Notes (Signed)
   SUBJECTIVE:   CHIEF COMPLAINT / HPI:  Discussed the use of AI scribe software for clinical note transcription with the patient, who gave verbal consent to proceed.  History of Present Illness [name] is a 58 year old female with hypertension who presents for blood pressure management and evaluation of a knot on her left arm.  Left arm mass - Palpable knot near the left elbow present for over one year - Recently increased in pain and tightness, especially when blood pressure is elevated or during periods of stress - No prior ultrasound evaluation  Hypertension - Treated with hydrochlorothiazide , losartan , and metoprolol  - Aspirin  taken daily - Gaps in losartan  adherence due to demanding work schedule(3rd shift)   Cardiac symptoms - History of heart murmur - Past episodes of significant blood pressure drops resulting in dizziness - Palpitations and irregular heartbeats, triggered by certain movements or coughing - No current dizziness or shortness of breath    PERTINENT  PMH / PSH: CAD, HTN  OBJECTIVE:  BP 130/80   Pulse 74   Ht 5' 5 (1.651 m)   Wt 216 lb 9.6 oz (98.2 kg)   LMP 10/06/2013   SpO2 98%   BMI 36.04 kg/m   General: well appearing, in no acute distress CV: RRR, radial pulses equal and palpable, no BLE edema  Resp: Normal work of breathing on room air, CTAB MSK: 4cm slightly mobile, not well circumcised soft mass on medial aspect of left antecubital fossa, no thrum, no overlying skin changes, tender to palpation (picture in media tab)  Neuro: Alert & Oriented   ASSESSMENT/PLAN:   Assessment & Plan Arm mass, left Most likely lipoma; however, given it has grown, is tender, and is not well circumcised with obtain ultrasound.  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  - Continue losartan  and hydrochlorothiazide   - Increase metoprolol   to 50 given patient is also having palpitations  - Amb 24 hr bp monitoring with Dr. Koval, pharmacy referral placed  Palpitations Patient with significant cardiac comorbidities. CAD, HFpEF. With ongoing palpitations that are concerning. Has not followed back with cardiology in many years.  - Referral to cardiology given the number of years it has been since she saw her previous cardiologist  - TSH, CBC, BMP  Screening for colon cancer Cologuard  Need for hepatitis C screening test - serum hep c  Encounter for immunization     Areta Saliva, MD [facility]

## 2024-08-12 ENCOUNTER — Telehealth: Payer: Self-pay

## 2024-08-12 MED ORDER — ROSUVASTATIN CALCIUM 20 MG PO TABS: 20.0000 mg | ORAL_TABLET | Freq: Every day | ORAL | 3 refills | Status: AC

## 2024-08-12 NOTE — Telephone Encounter (Signed)
 Patient calls nurse line in regards to rosuvastatin  prescription.   She reports having OV yesterday and thought that she was getting a new prescription for this medication.   I am not seeing this in chart review. Will forward to Dr. Nicholas.   Chiquita JAYSON English, RN

## 2024-08-12 NOTE — Telephone Encounter (Signed)
 Called patient. She reports that she never picked up a prescription of rosuvastatin  and will need rx.   Please send to [facility].   Chiquita JAYSON English, RN

## 2024-08-12 NOTE — Assessment & Plan Note (Signed)
 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  - Continue losartan  and hydrochlorothiazide   - Increase metoprolol  to 50 given patient is also having palpitations  - Amb 24 hr bp monitoring with Dr. Koval, pharmacy referral placed

## 2024-08-16 ENCOUNTER — Ambulatory Visit (HOSPITAL_COMMUNITY)

## 2024-08-22 ENCOUNTER — Ambulatory Visit: Admitting: Pharmacist

## 2024-08-23 ENCOUNTER — Ambulatory Visit: Admitting: Pharmacist

## 2024-08-30 ENCOUNTER — Ambulatory Visit (HOSPITAL_COMMUNITY)

## 2024-08-31 ENCOUNTER — Ambulatory Visit

## 2024-09-02 ENCOUNTER — Ambulatory Visit: Admitting: Family Medicine

## 2024-09-02 ENCOUNTER — Ambulatory Visit: Payer: Self-pay | Admitting: Family Medicine

## 2024-09-02 ENCOUNTER — Encounter: Payer: Self-pay | Admitting: Family Medicine

## 2024-09-02 VITALS — BP 142/88 | HR 71 | Ht 65.0 in | Wt 213.6 lb

## 2024-09-02 DIAGNOSIS — R6889 Other general symptoms and signs: Secondary | ICD-10-CM

## 2024-09-02 LAB — POC SOFIA 2 FLU + SARS ANTIGEN FIA
Influenza A, POC: POSITIVE — AB
Influenza B, POC: NEGATIVE
SARS Coronavirus 2 Ag: NEGATIVE

## 2024-09-02 MED ORDER — ONDANSETRON 4 MG PO TBDP: 4.0000 mg | ORAL_TABLET | Freq: Three times a day (TID) | ORAL | 0 refills | Status: AC | PRN

## 2024-09-02 NOTE — Patient Instructions (Addendum)
 It was wonderful to see you today! Thank you for choosing [facility].   Please bring ALL of your medications with you to every visit.   Today we talked about:  We are testing you for COVID and flu today but does not like you have some viral illness going on unfortunately.  I will send in some Zofran  for you for the nausea component and I would take Tylenol  every 6 hours as scheduled to help with fevers and to help you feel better.  If you think you can try to drink  care if you eat more fluids at home you can try that otherwise if you think you need IV fluids given how little you have drink over the past few days then please consider going to the ER for symptom management.  I think this will improve with time but unfortunately it can take a while in some folks.  Please follow up as needed for persistent symptoms  We are checking some labs today. If they are abnormal, I will call you. If they are normal, I will send you a MyChart message (if it is active) or a letter in the mail. If you do not hear about your labs in the next 2 weeks, please call the office.  Call the clinic at [phone] if your symptoms worsen or you have any concerns.  Please be sure to schedule follow up at the front desk before you leave today.   Izetta Nap, DO Family Medicine

## 2024-09-02 NOTE — Progress Notes (Addendum)
° ° °  SUBJECTIVE:   CHIEF COMPLAINT / HPI:   Discussed the use of AI scribe software for clinical note transcription with the patient, who gave verbal consent to proceed.  Flulike symptoms - Profound fatigue since last Saturday - Fever beginning last Saturday, with last episode today - Persistent malaise and need for bed rest from Saturday to Monday - Occasional cough with associated fatigue afterwards - Headache and vomiting at onset - Persistent nausea and markedly decreased appetite - Abdominal pain and diarrhea - Reduced urination since Tuesday - Very thirsty with dry mouth unrelieved by fluids - Exposure to grandchildren with upper respiratory symptoms - Denies SOB and chest pain  PERTINENT  PMH / PSH: HTN, CAD, HLD  OBJECTIVE:   BP (!) 144/80   Pulse 71   Ht 5' 5 (1.651 m)   Wt 213 lb 9.6 oz (96.9 kg)   LMP 10/06/2013   SpO2 94%   BMI 35.54 kg/m    General: NAD, pleasant, able to participate in exam HEENT: TM clear bilaterally.  Dry lips and tongue.  2 tender, small palpable cervical lymph nodes in anterior cervical region. Cardiac: RRR, no murmurs. Respiratory: CTAB, normal effort, No wheezes, rales or rhonchi Abdomen: Soft, nondistended.  Mild tenderness to palpation diffusely without localization.  No rebound tenderness or guarding. Extremities: no edema or cyanosis. Skin: warm and dry, no rashes noted Neuro: alert, no obvious focal deficits Psych: Normal affect and mood  ASSESSMENT/PLAN:   Assessment & Plan Flu-like symptoms High suspicion for influenza, POC testing in office positive for influenza A after conclusion of visit.  Some symptoms of mild dehydration, advised patient we will optimize with antinausea medication but may need IV fluids.  Given 4-5 days since symptom onset, not a candidate for Tamiflu and likely not to experience benefit from treatment. -Zofran  4 mg ODT -ED return precautions discussed, advised IVF management likely indicated given  degree of dehydration    Dr. Izetta Nap, DO [facility]

## 2024-09-05 ENCOUNTER — Ambulatory Visit: Admitting: Family Medicine

## 2024-09-05 VITALS — BP 182/92 | HR 85 | Temp 97.8°F | Ht 67.0 in

## 2024-09-05 DIAGNOSIS — I10 Essential (primary) hypertension: Secondary | ICD-10-CM

## 2024-09-05 DIAGNOSIS — L089 Local infection of the skin and subcutaneous tissue, unspecified: Secondary | ICD-10-CM | POA: Diagnosis not present

## 2024-09-05 DIAGNOSIS — J101 Influenza due to other identified influenza virus with other respiratory manifestations: Secondary | ICD-10-CM | POA: Diagnosis not present

## 2024-09-05 MED ORDER — MUPIROCIN 2 % EX OINT: 1.0000 | TOPICAL_OINTMENT | Freq: Two times a day (BID) | CUTANEOUS | 0 refills | Status: AC

## 2024-09-05 NOTE — Patient Instructions (Addendum)
 It was wonderful to see you today! Thank you for choosing [facility].   Please bring ALL of your medications with you to every visit.   Today we talked about:  Please apply the antibiotic to your face twice per day for the next 5 days to help resolve the infection.  If it is persistent or gets bigger again and you have fever please follow-up as it may be recurrent abscess that needs drainage. I am glad you are recovering from the flu, most important thing is to stay well-hydrated as you likely had some mild dehydration from your symptoms.  Bringing up some phlegm is a good thing your body is healing but it will take some time.  I will provide you a work note so you can continue to rest and recover this week. Please see the list of counseling resources as below, there located throughout the city so please find what is most convenient for you.  Please follow up for persistent symptoms  Call the clinic at [phone] if your symptoms worsen or you have any concerns.  Please be sure to schedule follow up at the front desk before you leave today.   Izetta Nap, DO Family Medicine     Therapy and Counseling Resources  [facility] Address: [facility], [address], [place], [place] [address] Phone: [phone]  [facility] (spanish speaking therapist available)(habla espanol)(take medicare and medicaid)  [address], [place], [place] [address], [place]  [email or web address] [phone]  [facility]:Psychiatry and Counseling [address] [facility] [address] [place], [place] [address] [phone]  [facility]   [address] [place], [place] [address]      [phone]  [facility] (spanish available) [address]  [place], [place] [address] [phone]  [facility] (take [facility] and medicare) [address]  [place], [place] [address]        [phone]     [facility] (virtual only) [phone]  Janit Griffins [facility] [address], [place], [place] [phone]  [facility]:  [address] [place] [phone]  [facility]:  [address] [place] [phone]  [facility] (under & uninsured) [address]   [place] [phone]    [email or web address]    [facility] [address]  [place]    [phone]  [facility] -[address]     Phone:  [phone]     [facility]-  [address]  [phone]   [facility] [address]      [place], [place] [phone]  [facility]: [address] [place], [place], [place]  [phone]   [facility] (Spanish therapist) [email or web address]  [address] [place]  [address]   [place] [address]    [phone]    [facility]   [address],  [place], [place]  [phone]   [facility]  [address] [place]  [phone]  [facility]  [address] [place], [place]        [phone]  Open Access/Walk In Clinic under & uninsured  [facility]  [address] [place], [place] [phone] Crisis [phone]  [facility] the [address],  (Spanish)   [address] ,  [place]: [phone]) 8:30 - 12; 1 - 2:30  Family [facility] of the [employer],  [address], [place]    ([phone]):8:30 - 12; 2 - 3PM  [facility] [employer],  [address],  [place]; [phone]):   Mon - Fri 8 AM - 5 PM  [facility] [address] [place]  MWF 12:30 to 3:00 or call to schedule an appointment  [phone]  Specific Provider options Psychology Today  [email or web address].psychologytoday.com/us  click on find a  therapist  enter your zip code left side and select or tailor a therapist for your specific need.   [facility] Provider Directory [email or web address]  (Medicaid)   Follow all drop down to find a provider  Social Support program Mental Health [place] [phone] or photosolver.pl [address], [place], [place] Recovery support and educational   24- Hour Availability:   [facility]  [address] [place], [place] [phone] Crisis [phone]  Family Service of the [facility] [phone]  [place] [facility]  [phone]   [facility]  [phone] (after hours)  Therapeutic Alternative/Mobile Crisis   5641037272  [place]  National Suicide Hotline  (870)513-0472 MERRILYN)  Call 911 or go to emergency room  [facility]  [phone]);  [place] and [place]  [phone]); [place], [place], [place], [place], [place], [place], [place]

## 2024-09-05 NOTE — Progress Notes (Signed)
° ° °  SUBJECTIVE:   CHIEF COMPLAINT / HPI:   Discussed the use of AI scribe software for clinical note transcription with the patient, who gave verbal consent to proceed.  Facial lesion - Onset 48 hours ago - Initially appeared as a pimple, progressed to swelling, induration, and green discoloration - Lesion enlarged, ruptured with slight drainage - Remains firm with a palpable lump  Influenza A - Productive cough with green sputum - Cough has improved over the last 24 hours - Associated with subjective fever, sweating, and profound malaise - Requires frequent rest - Unable to work due to current illness  Psychological distress - Requests referral to therapist - Past personal issues have resurfaced and require attention       PERTINENT  PMH / PSH: HTN, CAD, HLD   OBJECTIVE:   BP (!) 181/85   Pulse 85   Temp 97.8 F (36.6 C)   Ht 5' 7 (1.702 m)   LMP 10/06/2013   SpO2 95%   BMI 33.45 kg/m    General: NAD, pleasant, able to participate in exam Cardiac: RRR, no murmurs. Respiratory: CTAB, normal effort, No wheezes, rales or rhonchi Extremities: no edema or cyanosis. Skin: warm and dry.  Approximately 1 cm erythematous nodule with central scabbing present on left lower chin without notable drainage.  No fluctuance.  Mild induration of tissue Neuro: alert, no obvious focal deficits Psych: Normal affect and mood  ASSESSMENT/PLAN:   Assessment & Plan Skin pustule Most consistent with superficial boil/pustule that already spontaneously drained with mild induration of tissue.  Will treat with topical antibiotics but likely to self resolve. -Mupirocin  twice daily 5 days Influenza A Symptomatically improving, likely as well as degree of mild dehydration below concerns for secondary bacterial infection.  Advised continued hydration and supportive care.  Work note provided. Essential hypertension, benign 182/92 upon repeat, has been persistently elevated patient states she  did not take any of her medications today.  Advise she needs to return for hypertension management when illness resolving as above after taking her medication.   Dr. Izetta Nap, DO [place] [facility]

## 2024-09-05 NOTE — Assessment & Plan Note (Signed)
 182/92 upon repeat, has been persistently elevated patient states she did not take any of her medications today.  Advise she needs to return for hypertension management when illness resolving as above after taking her medication.

## 2024-10-11 ENCOUNTER — Telehealth

## 2024-10-11 ENCOUNTER — Telehealth: Admitting: Emergency Medicine

## 2024-10-11 DIAGNOSIS — R3989 Other symptoms and signs involving the genitourinary system: Secondary | ICD-10-CM | POA: Diagnosis not present

## 2024-10-11 MED ORDER — CEPHALEXIN 500 MG PO CAPS: 500.0000 mg | ORAL_CAPSULE | Freq: Three times a day (TID) | ORAL | 0 refills | Status: AC

## 2024-10-11 NOTE — Patient Instructions (Signed)
 " [name], thank you for joining Cassandra CHRISTELLA Belt, NP for today's virtual visit.  While this provider is not your primary care provider (PCP), if your PCP is located in our provider database this encounter information will be shared with them immediately following your visit.   A [facility] MyChart account gives you access to today's visit and all your visits, tests, and labs performed at [facility]  click here if you don't have a [facility] MyChart account or go to [email or web address]  Consent: (Patient) [name] provided verbal consent for this virtual visit at the beginning of the encounter.  Current Medications:  Current Outpatient Medications:    cephALEXin  (KEFLEX ) 500 MG capsule, Take 1 capsule (500 mg total) by mouth 3 (three) times daily for 7 days., Disp: 21 capsule, Rfl: 0   albuterol  (VENTOLIN  HFA) 108 (90 Base) MCG/ACT inhaler, Inhale 1-2 puffs into the lungs every 6 (six) hours as needed for wheezing or shortness of breath., Disp: 6.7 g, Rfl: 1   aspirin  EC 81 MG tablet, Take 81 mg by mouth daily., Disp: , Rfl:    busPIRone  (BUSPAR ) 15 MG tablet, Take 0.5 tablets (7.5 mg total) by mouth 3 (three) times daily., Disp: 45 tablet, Rfl: 2   hydrochlorothiazide  (HYDRODIURIL ) 25 MG tablet, Take 1 tablet (25 mg total) by mouth every morning., Disp: 90 tablet, Rfl: 0   losartan  (COZAAR ) 100 MG tablet, Take 1 tablet (100 mg total) by mouth daily., Disp: 90 tablet, Rfl: 0   metoprolol  succinate (TOPROL -XL) 50 MG 24 hr tablet, Take 1 tablet (50 mg total) by mouth daily., Disp: 90 tablet, Rfl: 0   mupirocin  ointment (BACTROBAN ) 2 %, Apply 1 Application topically 2 (two) times daily., Disp: 22 g, Rfl: 0   nitroGLYCERIN  (NITROSTAT ) 0.4 MG SL tablet, Place 1 tablet (0.4 mg total) under the tongue every 5 (five) minutes as needed for chest pain. If you need to take >3, please go to the ED. (Patient not taking: Reported on 02/10/2023), Disp: 10 tablet, Rfl:  0   ondansetron  (ZOFRAN -ODT) 4 MG disintegrating tablet, Take 1 tablet (4 mg total) by mouth every 8 (eight) hours as needed for nausea., Disp: 10 tablet, Rfl: 0   rosuvastatin  (CRESTOR ) 20 MG tablet, Take 1 tablet (20 mg total) by mouth daily., Disp: 90 tablet, Rfl: 3   tiZANidine  (ZANAFLEX ) 4 MG tablet, Take 1 tablet (4 mg total) by mouth every 6 (six) hours as needed for muscle spasms., Disp: 30 tablet, Rfl: 0   Medications ordered in this encounter:  Meds ordered this encounter  Medications   cephALEXin  (KEFLEX ) 500 MG capsule    Sig: Take 1 capsule (500 mg total) by mouth 3 (three) times daily for 7 days.    Dispense:  21 capsule    Refill:  0     *If you need refills on other medications prior to your next appointment, please contact your pharmacy*  Follow-Up: Call back or seek an in-person evaluation if the symptoms worsen or if the condition fails to improve as anticipated.  [facility] [phone]  Other Instructions  Drink lots of water. If you are not significantly better in 24 hours after starting antibiotics, please get checked in person. If you develop severe symptoms such as not able to urinate, vomiting, fever, or are just feeling much worse, please go to the ER.    If you have been instructed to have an in-person evaluation today at a local Urgent  Care facility, please use the link below. It will take you to a list of all of our available [facility], including address, phone number and hours of operation. Please do not delay care.  [facility]  If you or a family member do not have a primary care provider, use the link below to schedule a visit and establish care. When you choose a [facility] primary care physician or advanced practice provider, you gain a long-term partner in health. Find a Primary Care Provider  Learn more about [facility] in-office and virtual care options: [facility] - Get Care Now  "
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
